# Patient Record
Sex: Male | Born: 1960
Health system: Southern US, Community
[De-identification: ages and names within clinical notes are randomized; demographics above are authoritative.]

## PROBLEM LIST (undated history)

## (undated) DIAGNOSIS — J45909 Unspecified asthma, uncomplicated: Secondary | ICD-10-CM

## (undated) DIAGNOSIS — T7840XA Allergy, unspecified, initial encounter: Secondary | ICD-10-CM

## (undated) DIAGNOSIS — A4902 Methicillin resistant Staphylococcus aureus infection, unspecified site: Secondary | ICD-10-CM

## (undated) DIAGNOSIS — I10 Essential (primary) hypertension: Secondary | ICD-10-CM

## (undated) DIAGNOSIS — J302 Other seasonal allergic rhinitis: Secondary | ICD-10-CM

## (undated) DIAGNOSIS — K602 Anal fissure, unspecified: Secondary | ICD-10-CM

## (undated) DIAGNOSIS — I499 Cardiac arrhythmia, unspecified: Secondary | ICD-10-CM

## (undated) DIAGNOSIS — K219 Gastro-esophageal reflux disease without esophagitis: Secondary | ICD-10-CM

## (undated) DIAGNOSIS — R091 Pleurisy: Secondary | ICD-10-CM

## (undated) DIAGNOSIS — J189 Pneumonia, unspecified organism: Secondary | ICD-10-CM

## (undated) DIAGNOSIS — K635 Polyp of colon: Secondary | ICD-10-CM

## (undated) DIAGNOSIS — C4490 Unspecified malignant neoplasm of skin, unspecified: Secondary | ICD-10-CM

## (undated) HISTORY — DX: Essential (primary) hypertension: I10

## (undated) HISTORY — DX: Anal fissure, unspecified: K60.2

## (undated) HISTORY — DX: Methicillin resistant Staphylococcus aureus infection, unspecified site: A49.02

## (undated) HISTORY — DX: Unspecified malignant neoplasm of skin, unspecified: C44.90

## (undated) HISTORY — DX: Gastro-esophageal reflux disease without esophagitis: K21.9

## (undated) HISTORY — DX: Polyp of colon: K63.5

## (undated) HISTORY — DX: Allergy, unspecified, initial encounter: T78.40XA

## (undated) HISTORY — PX: CHOLECYSTECTOMY: SHX55

---

## 1999-04-16 ENCOUNTER — Emergency Department (HOSPITAL_COMMUNITY): Admission: EM | Admit: 1999-04-16 | Discharge: 1999-04-16 | Payer: Self-pay | Admitting: Emergency Medicine

## 2003-12-28 HISTORY — PX: KNEE SURGERY: SHX244

## 2003-12-28 HISTORY — PX: HEMORRHOID SURGERY: SHX153

## 2004-02-16 ENCOUNTER — Inpatient Hospital Stay (HOSPITAL_COMMUNITY): Admission: EM | Admit: 2004-02-16 | Discharge: 2004-02-17 | Payer: Self-pay | Admitting: *Deleted

## 2004-02-17 ENCOUNTER — Encounter (INDEPENDENT_AMBULATORY_CARE_PROVIDER_SITE_OTHER): Payer: Self-pay | Admitting: *Deleted

## 2004-06-23 ENCOUNTER — Ambulatory Visit (HOSPITAL_COMMUNITY): Admission: RE | Admit: 2004-06-23 | Discharge: 2004-06-23 | Payer: Self-pay | Admitting: Orthopaedic Surgery

## 2004-06-23 ENCOUNTER — Ambulatory Visit (HOSPITAL_BASED_OUTPATIENT_CLINIC_OR_DEPARTMENT_OTHER): Admission: RE | Admit: 2004-06-23 | Discharge: 2004-06-23 | Payer: Self-pay | Admitting: Orthopaedic Surgery

## 2004-12-27 HISTORY — PX: APPENDECTOMY: SHX54

## 2005-08-24 ENCOUNTER — Ambulatory Visit (HOSPITAL_COMMUNITY): Admission: EM | Admit: 2005-08-24 | Discharge: 2005-08-25 | Payer: Self-pay | Admitting: Emergency Medicine

## 2005-08-24 ENCOUNTER — Encounter (INDEPENDENT_AMBULATORY_CARE_PROVIDER_SITE_OTHER): Payer: Self-pay | Admitting: Specialist

## 2006-02-25 ENCOUNTER — Ambulatory Visit (HOSPITAL_COMMUNITY): Admission: RE | Admit: 2006-02-25 | Discharge: 2006-02-25 | Payer: Self-pay | Admitting: Family Medicine

## 2006-03-07 ENCOUNTER — Encounter: Admission: RE | Admit: 2006-03-07 | Discharge: 2006-03-07 | Payer: Self-pay | Admitting: General Surgery

## 2007-12-28 HISTORY — PX: KNEE ARTHROSCOPY: SUR90

## 2008-11-05 ENCOUNTER — Ambulatory Visit (HOSPITAL_BASED_OUTPATIENT_CLINIC_OR_DEPARTMENT_OTHER): Admission: RE | Admit: 2008-11-05 | Discharge: 2008-11-05 | Payer: Self-pay | Admitting: Orthopaedic Surgery

## 2008-12-27 HISTORY — PX: SHOULDER SURGERY: SHX246

## 2009-04-22 ENCOUNTER — Ambulatory Visit (HOSPITAL_BASED_OUTPATIENT_CLINIC_OR_DEPARTMENT_OTHER): Admission: RE | Admit: 2009-04-22 | Discharge: 2009-04-22 | Payer: Self-pay | Admitting: Orthopaedic Surgery

## 2009-08-13 ENCOUNTER — Encounter: Admission: RE | Admit: 2009-08-13 | Discharge: 2009-08-13 | Payer: Self-pay | Admitting: Family Medicine

## 2010-12-27 HISTORY — PX: NASAL FRACTURE SURGERY: SHX718

## 2011-01-17 ENCOUNTER — Encounter: Payer: Self-pay | Admitting: Family Medicine

## 2011-04-07 LAB — BASIC METABOLIC PANEL
BUN: 10 mg/dL (ref 6–23)
CO2: 26 mEq/L (ref 19–32)
Chloride: 105 mEq/L (ref 96–112)

## 2011-04-07 LAB — POCT HEMOGLOBIN-HEMACUE: Hemoglobin: 16.6 g/dL (ref 13.0–17.0)

## 2011-05-11 NOTE — Op Note (Signed)
[name], [name]               ACCOUNT NO.:  [id number]   MEDICAL RECORD NO.:  [id number]          PATIENT TYPE:  AMB   LOCATION:  DSC                          FACILITY:  [facility]   PHYSICIAN:  Lubertha Basque. Dalldorf, M.D.DATE OF BIRTH:  [birth year]   DATE OF PROCEDURE:  04/22/2009  DATE OF DISCHARGE:                               OPERATIVE REPORT   PREOPERATIVE DIAGNOSES:  1. Left shoulder impingement.  2. Left shoulder partial rotator cuff tear.   POSTOPERATIVE DIAGNOSES:  1. Left shoulder impingement.  2. Left shoulder partial rotator cuff tear.   PROCEDURE:  1. Left shoulder arthroscopic acromioplasty.  2. Left shoulder partial claviculectomy.  3. Left shoulder arthroscopic cuff debridement.   ANESTHESIA:  General and block.   ATTENDING SURGEON:  Lubertha Basque. Jerl Santos, MD   ASSISTANT:  Lindwood Qua, PA   INDICATIONS FOR PROCEDURE:  The patient is a 50 year old male with long  history of left shoulder pain.  This has persisted despite an exercise  program and 2 subacromial injections both of which have afforded him  transient relief.  He has had an MRI scan done which shows some  irritation of his rotator cuff but no full-thickness tear.  He has pain  which limits his ability to use his arm on the job and to rest and he is  offered an arthroscopy.  Informed operative consent was obtained after  discussion of possible complications including reaction to anesthesia  and infection.   SUMMARY/FINDINGS/PROCEDURE:  Under general anesthesia and a block, the  left shoulder arthroscopy was performed.  Glenohumeral joint showed no  degenerative changes, and the biceps tendon and subscapularis tendon  looked benign.  He did have a partial-thickness tear of the rotator  cuff, supraspinatus portion, but it was less than 5% thickness of the  rotator cuff.  In the subacromial space, he had some irritation of the  rotator cuff, but no significant tear was seen.  He had a prominent  subacromial morphology, addressed with an acromioplasty back to a flat  surface.  He also had a prominence of the distal clavicle addressed with  a partial claviculectomy.   DESCRIPTION OF THE PROCEDURE:  The patient was brought to the operating  suite where general anesthetic was applied without difficulty.  He was  also given a block in the preanesthesia area.  He was positioned in a  beach-chair position and prepped and draped in normal sterile fashion.  After the administration of IV Kefzol, an arthroscopy of the left  shoulder was performed through a total of 2 portals.  Findings were as  noted above.  Procedure consisted of the debridement of partial-  thickness cuff tear and bursa followed by an acromioplasty done with the  bur in the lateral position followed by transfer of the bur to the  posterior position.  We resected the distal clavicle undersurface in a  similar fashion.  The shoulder was thoroughly irrigated and again the  cuff was thoroughly examined with no tear worthy of repair found.  The  portals were approximated loosely with nylon followed by Adaptic and a  dry  gauze dressing with tape.  Estimated blood loss and intraoperative  fluids obtained from anesthesia records.   DISPOSITION:  The patient was extubated in the operating room and taken  to recovery room in stable addition.  He was to go home on the same day  and follow up in the office next week.  I will contact him by phone  tonight.      Lubertha Basque Jerl Santos, M.D.  Electronically Signed     PGD/MEDQ  D:  04/22/2009  T:  04/22/2009  Job:  [id number]

## 2011-05-11 NOTE — Op Note (Signed)
[name], [name]               ACCOUNT NO.:  [id number]   MEDICAL RECORD NO.:  [id number]          PATIENT TYPE:  AMB   LOCATION:  DSC                          FACILITY:  [facility]   PHYSICIAN:  Lubertha Basque. Dalldorf, M.D.DATE OF BIRTH:  1961-11-01   DATE OF PROCEDURE:  11/05/2008  DATE OF DISCHARGE:                               OPERATIVE REPORT   PREOPERATIVE DIAGNOSES:  1. Left knee torn medial meniscus.  2. Left knee chondromalacia and loose body.   POSTOPERATIVE DIAGNOSES:  1. Left knee torn lateral meniscus.  2. Left knee chondromalacia and loose body.   PROCEDURES:  1. Left knee partial lateral meniscectomy.  2. Left knee abrasion chondroplasty and removal of loose body.   ANESTHESIA:  General.   ATTENDING SURGEON:  Lubertha Basque. Jerl Santos, MD   INDICATIONS FOR PROCEDURE:  The patient is a 50 year old man who is many  years out from an ACL reconstruction.  His knee has remained stable, but  he has developed some pain recently.  This has persisted despite oral  anti-inflammatories and rest and bracing.  He has undergone an MRI scan,  which shows a meniscus tear and chondromalacia and loose body.  He is  offered an arthroscopy.  Informed operative consent was obtained after  discussion of possible complications of reaction to anesthesia and  infection.   SUMMARY, FINDINGS, AND PROCEDURE:  Under general anesthesia, a left knee  arthroscopy was performed.  Suprapatellar pouch was benign while the  patellofemoral joint exhibited some grade 3 and 4 change, addressed with  a thorough chondroplasty and abrasion to bleeding bone through the  intertrochlear region.  Medial compartment exhibited no evidence of  meniscal or articular cartilage injury.  The entire meniscus was probed  and well visualized superior and inferior.  The ACL reconstruction  appeared intact and normal and functional.  The lateral compartment  exhibited a small middle horn meniscus tear, addressed with a tiny  partial lateral meniscectomy.  He had some grade 4 change fairly lateral  and posterior in this compartment and a chondroplasty was done here.  He  did have a loose body, which I removed.  I also checked the posterior  compartment of the knee through the notch and no additional loose bodies  were seen there.   DESCRIPTION OF PROCEDURE:  The patient was taken to the operating suite  where general anesthetic was applied without difficulty.  He was  positioned supine and prepped and draped in the normal sterile fashion.  After administration of IV Kefzol, an arthroscopy of the left knee was  performed through a total of 2 portals.  Findings were as noted above  and procedure consisted of the partial lateral meniscectomy followed by  removal of the loose body, which was cartilaginous in nature and  measured 1 x 5 x 5 mm.  We then performed indicated chondroplasties  lateral and patellofemoral with abrasion to bleeding bone done  patellofemoral in the intertrochlear groove.  I did check the posterior  aspect of the knee through the notch and no additional loose bodies were  seen.  The knee was  thoroughly irrigated followed by placement of  Marcaine with epinephrine and morphine.  Adaptic was placed over the  portals followed by dry gauze and loose Ace wrap.  Estimated blood loss  and intraoperative fluids could be obtained from anesthesia records.   DISPOSITION:  The patient was extubated in the operating room and taken  to the recovery room in stable addition.  He was to go home same day and  follow up in my office next week.  I will contact him by phone tonight.      Lubertha Basque Jerl Santos, M.D.  Electronically Signed     PGD/MEDQ  D:  11/05/2008  T:  11/05/2008  Job:  [id number]

## 2011-05-14 NOTE — H&P (Signed)
[name], [name]               ACCOUNT NO.:  [id number]   MEDICAL RECORD NO.:  [id number]          PATIENT TYPE:  INP   LOCATION:  0107                         FACILITY:  [facility]   PHYSICIAN:  Sharlet Salina T. Hoxworth, M.D.DATE OF BIRTH:  1961-10-05   DATE OF ADMISSION:  08/24/2005  DATE OF DISCHARGE:                                HISTORY & PHYSICAL   CHIEF COMPLAINT:  Right lower quadrant abdominal pain.   HISTORY OF PRESENT ILLNESS:  Mr. Muellner is a 50 year old white male who  presents with 36 hours of constant progressively worsening right lower  quadrant pain.  He describes constant, dull, aching pain with sharp  exacerbations.  He has been nauseated but had no vomiting.  No fever or  chills.  Bowel movements have been normal.  No urinary symptoms.  He denies  any chronic abdominal pain or previous similar complaints.  The pain is  worse with movement and better lying still.   PAST MEDICAL HISTORY:  He has had knee surgery, hemorrhoidectomy, and  bilateral inguinal hernia repair as a child.  No significant medical  problems.   MEDICATIONS:  None.   ALLERGIES:  CODEINE.   SOCIAL HISTORY:  He works as a Community education officer.  Does not smoke  cigarettes.  Drinks alcohol occasionally.  He is married.   FAMILY HISTORY:  Noncontributory.   REVIEW OF SYSTEMS:  GENERAL:  No fever, chills, malaise.  HEENT:  No vision,  hearing, or swallowing problems.  RESPIRATORY:  No shortness of breath or  cough, history of asthma.  CARDIAC:  No chest pain, palpitations, history of  heart disease.  ABDOMEN, GI, GU:  As above.  MUSCULOSKELETAL:  Denies  chronic joint pain, swelling, muscle aches.   PHYSICAL EXAMINATION:  VITAL SIGNS:  Temperature 98.8, pulse 90,  respirations 18, blood pressure 150/90.  GENERAL:  He is a well-developed white male who appears in pain and somewhat  ill.  SKIN:  Slightly diaphoretic.  No rash or infection.  HEENT:  Sclerae anicteric.  No masses.  No  thyromegaly.  LYMPH NODES:  No cervical, supraclavicular, or inguinal nodes palpable.  LUNGS:  Clear to auscultation.  No wheezing or increased work of breathing.  CARDIAC:  Regular rate and rhythm without murmurs.  No edema.  ABDOMEN:  Nondistended.  Bowel sounds hypoactive.  There is well localized,  marked right lower quadrant tenderness with guarding and positive rebound  and heel tap pain.  EXTREMITIES:  No joint swelling or deformity.  NEUROLOGIC:  Alert and oriented.  Motor and sensory grossly normal.   LABORATORY DATA:  CBC normal with white count 6.4.  Urinalysis negative.  Electrolytes, LFTs normal.   ASSESSMENT AND PLAN:  A generally healthy 50 year old male with progressive  severe right lower quadrant abdominal pain, nausea, and marked tenderness.  Clinically this is highly suspicious for acute appendicitis.  I have  recommended proceeding with laparoscopic appendectomy and the patient is  admitted for this procedure.      Lorne Skeens. Hoxworth, M.D.  Electronically Signed     BTH/MEDQ  D:  08/24/2005  T:  08/25/2005  Job:  [id number]

## 2011-05-14 NOTE — Op Note (Signed)
[name], [name]                         ACCOUNT NO.:  [id number]   MEDICAL RECORD NO.:  [id number]                   PATIENT TYPE:  AMB   LOCATION:  DSC                                  FACILITY:  [facility]   PHYSICIAN:  Lubertha Basque. Jerl Santos, M.D.             DATE OF BIRTH:  08-01-1961   DATE OF PROCEDURE:  06/23/2004  DATE OF DISCHARGE:                                 OPERATIVE REPORT   PREOPERATIVE DIAGNOSES:  1. Left knee torn anterior cruciate ligament.  2. Left knee torn medial meniscus.   POSTOPERATIVE DIAGNOSES:  1. Left knee anterior cruciate ligament tear.  2. Left knee chondromalacia, medial femoral condyle.   PROCEDURES:  1. Left knee anterior cruciate ligament reconstruction.  2. Left knee chondroplasty, medial femoral condyle.   ANESTHESIA:  General.   ATTENDING SURGEON:  Lubertha Basque. Jerl Santos, M.D.   ASSISTANT:  Lindwood Qua, P.A.   INDICATION FOR PROCEDURE:  The patient is a 50 year old man who injured his  knee several months ago.  He has persisted with an unstable feeling and an  effusion.  He has undergone an MRI scan, which shows a torn medial meniscus  and a complete ACL disruption.  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  He is offered  an ACL reconstruction in hopes of stabilizing his knee.  Informed operative  consent was obtained after discussion of the possible complications of,  reaction to anesthesia, infection, neurovascular injury, DVT, and the  probability for some significant physical therapy being required.   DESCRIPTION OF PROCEDURE:  The patient was taken to the operating suite,  where a general anesthetic was applied without difficulty.  He was  positioned supine and prepped and draped in the normal sterile fashion.  After the administration of preop IV antibiotics, an arthroscopy of the left  knee was performed through two inferior portals.  The suprapatellar pouch  was benign,  as was the patellofemoral joint.  The medial meniscus appeared  to be intact, and I probed the entire posterior horn.  He did have some  grade 3 chondromalacia of a focal area on the medial femoral condyle,  addressed with a chondroplasty back to stable structures.  Lateral  compartment exhibited no evidence of a meniscal or articular cartilage  injury.  The ACL was completely disrupted, avulsed off the femoral  attachment.  The PCL was intact.  The residual ACL was removed, followed by  a conservative notchplasty with an arthroscopic bur.  A tibial guide was  placed into the knee just anterior to the PCL and utilized to pass a  guidewire through an anterior stab wound on the knee up into the joint just  anterior to the PCL.  This was used to over-ream a tunnel on the tibia to a  diameter of 10 mm.  Through this tunnel a transtibial guide was placed in  the  over-the-top position.  A long guidewire was placed through this and out  the proximal thigh.  This was utilized to ream the distal femur to a depth  of about 3 cm and diameter of 9 mm.  Care was taken to ensure that a 1 or 2  mm posterior wall remained.  A graft was fully defrosted on the back table.  This was then contoured to fit through 9 and 10 mm tunnels.  A PDS suture  was placed through one of the bone blocks and a wire through the other.  This fully-defrosted tensioned graft was then pulled through the tibial  tunnel and into the femoral tunnel utilizing the aforementioned guidewire  for suture passing.  Care was taken to keep the tendinous portion of the  graft in a posterior position as it entered the femoral tunnel.  This graft  was then secured with an interference screw placed in the anterior aspect of  the femoral tunnel over a guidewire.  This was an 8 x 25 metal [employer]  screw.  The knee then ranged fully.  The graft was felt to be fully  isometric.  A second guidewire was placed through the tibial tunnel and seen  to  enter the knee arthroscopically.  An identical interference screw was  placed to secure this graft in position.  The knee again ranged fully.  The  graft was probed and was felt to be taught throughout.  His Lachman test,  which had been loose preoperatively, was now tight.  The knee was irrigated,  followed by removal of arthroscopic equipment.  Simple sutures of nylon were  used to loosely reapproximate the incision.  Estimated blood loss and  intraoperative fluids can be obtained from anesthesia records.  No  tourniquet was utilized.  Adaptic was placed on the wound, followed by dry  gauze and a loose Ace wrap.   DISPOSITION:  The patient was extubated in the operating room and taken to  the recovery room in stable condition.  Plans were for him to go home the  same day, and I will contact him by phone tonight.  He will follow up in  less than a week.                                               Lubertha Basque Jerl Santos, M.D.    PGD/MEDQ  D:  06/23/2004  T:  06/23/2004  Job:  [id number]

## 2011-05-14 NOTE — Op Note (Signed)
[name], [name]               ACCOUNT NO.:  [id number]   MEDICAL RECORD NO.:  [id number]          PATIENT TYPE:  INP   LOCATION:  0107                         [facility]ACILITY:  [facility]   PHYSICIAN:  Sharlet Salina T. Hoxworth, M.D.DATE OF BIRTH:  08-28-1961   DATE OF PROCEDURE:  08/24/2005  DATE OF DISCHARGE:                                 OPERATIVE REPORT   PREOPERATIVE DIAGNOSIS:  Acute appendicitis.   POSTOPERATIVE DIAGNOSIS:  Torsed appendix epiploica of the cecum.   SURGICAL PROCEDURE:  Laparoscopic appendectomy and excision of appendix  epiploica.   SURGEON:  Lorne Skeens. Hoxworth, M.D.   ANESTHESIA:  General.   BRIEF HISTORY:  [name] is a 50 year old male who presents with 36  hours of progressively worsening and now quite severe right lower quadrant  abdominal pain. Examination was remarkable for marked localized tenderness  in the right lower quadrant, guarding and positive rebound and percussion  tenderness. He is felt to have acute appendicitis clinically and  laparoscopic appendectomy has been recommended and accepted. The nature of  the procedure, indications, risks of bleeding, infection and negative  appendectomy have been discussed and understood. He is brought to the  operating room for this procedure.   DESCRIPTION OF PROCEDURE:  The patient was brought to the operating room,  placed in supine position on the operating table and general endotracheal  anesthesia was induced. He received preoperative IV antibiotics. The abdomen  was widely sterilely prepped and draped. Correct position and procedure were  verified. Local anesthesia was used to infiltrate the trocar sites prior to  the incision. A 1 cm incision was made at the umbilicus and dissection  carried down the midline fascia which was sharply incised for 1 cm and the  peritoneum entered under direct vision. Through a mattress suture of #0  Vicryl, the Hasson trocar was placed and pneumoperitoneum  established. Under  direct vision, a 5 mm trocar was placed in the right upper quadrant and a 12  mm trocar in the left lower quadrant. There were noted to be some adhesions  in both groins likely from his previous hernia repairs. The appendix was  visualized lying anteriorly and it did appear mildly congested but not  obviously inflamed. However, on the anterior surface of the cecum was a  torsed appendix epiploica that was necrotic and moderately inflamed. I  suspect this was the source of his pain. The appendix epiploica was elevated  and excised with the harmonic scalpel. I examined the rest of the right  colon, cecum, terminal ileum and sigmoid colon and there were no other  abnormalities noted. The appendix was elevated and some lateral peritoneal  attachments divided completely freeing the appendix. The mesoappendix was  divided with the harmonic scalpel completely freeing the appendix down to  its base. The appendix was divided from the cecum at its base with a single  firing of the GIA 45 mm stapler. The appendix was placed in an EndoCatch bag  and brought out through the umbilicus. The operative site was inspected for  hemostasis which appeared complete. Trocars removed under direct vision and  all CO2 evacuated. The mattress suture was  secured at the through the umbilicus. Skin incisions were closed with  interrupted subcuticular 4-0 Monocryl and Steri-Strips. Sponge, needle and  instrument counts were correct. Dry sterile dressings were applied and the  patient taken to recovery in good condition.      Lorne Skeens. Hoxworth, M.D.  Electronically Signed     BTH/MEDQ  D:  08/24/2005  T:  08/25/2005  Job:  [id number]

## 2011-05-14 NOTE — Op Note (Signed)
NAMETHADDAEUS, GRANJA                         ACCOUNT NO.:  [id number]   MEDICAL RECORD NO.:  [id number]                   PATIENT TYPE:  EMS   LOCATION:  ED                                   FACILITY:  [facility]   PHYSICIAN:  Anselm Pancoast. Zachery Dakins, M.D.          DATE OF BIRTH:  07-01-1961   DATE OF PROCEDURE:  02/16/2004  DATE OF DISCHARGE:                                 OPERATIVE REPORT   PREOPERATIVE DIAGNOSES:  Prolapsed thrombosed internal/external hemorrhoids.   OPERATION:  Internal and external hemorrhoidectomy, three quadrants.   ANESTHESIA:  General.   SURGEON:  Anselm Pancoast. Zachery Dakins, M.D.   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  He went to [facility] today and was seen by Dr.  Merla Riches who thought this was a rectal prolapse.  It is not, but it is  significantly prolapsed internal and also some mild external hemorrhoids.  I  recommended that we proceed on with formal hemorrhoidectomy because of the  spasm.  I do not feel I can adequately try to remove this as it is  predominantly an internal thrombosed hemorrhoid that is prolapsed.  Permission was obtained.   I first did a proctoscopic exam and found no evidence of any other  abnormalities besides these hemorrhoids and then prepped with Betadine  surgical solution and draped in the lithotomy position after induction of  general anesthesia.  He had been given three grams of Unasyn preoperatively  and after draping in a sterile manner, I anesthetized the perianal area with  approximately 20 cc of 0.5% Marcaine with adrenaline.  Next, I slightly  dilated his anus but then used the bullet anoscope and actually first  removed kind of a moderate sized internal hemorrhoid anteriorly.  The  largest necrotic area was the right lateral and I elevated all of these off  the sphincters and underclamped  them with __________ clamp after hemostasis  had been obtained __________  2-0 chromic sutures, removed the hemorrhoid  and then sutured the incision with some new stitches of 2-0 chromic in a  running stitch to close the external and internal portion.  A similar manner  was performed on the left.  There was a smaller hemorrhoid and then all  areas were inspected with good hemostasis and I used some Nupercaine on an  open 4x4 and kind of placed it in the anal canal for immediate postop pain  control and also for hemostasis.  The patient tolerated the procedure nicely  and was taken to the recovery room in stable postoperative condition.  I  will discuss with him in an hour whether he will be discharged or kept  overnight and go home in the morning.  He, of course, will not be able to  return to work for several days and as far as his  weightlifting, which is a  hobby, I would recommend that we hold off on that for about three weeks.                                               Anselm Pancoast. Zachery Dakins, M.D.    WJW/MEDQ  D:  02/16/2004  T:  02/16/2004  Job:  [id number]

## 2011-06-18 ENCOUNTER — Encounter (HOSPITAL_BASED_OUTPATIENT_CLINIC_OR_DEPARTMENT_OTHER)
Admission: RE | Admit: 2011-06-18 | Discharge: 2011-06-18 | Disposition: A | Discharge status: Home / Self Care | Payer: BC Managed Care – PPO | Source: Ambulatory Visit | Attending: Otolaryngology | Admitting: Otolaryngology

## 2011-06-18 LAB — BASIC METABOLIC PANEL
BUN: 15 mg/dL (ref 6–23)
Calcium: 9.2 mg/dL (ref 8.4–10.5)
Chloride: 104 mEq/L (ref 96–112)
Creatinine, Ser: 0.94 mg/dL (ref 0.50–1.35)
GFR calc Af Amer: >60 mL/min (ref >60)
Potassium: 4.4 mEq/L (ref 3.5–5.1)

## 2011-06-21 ENCOUNTER — Ambulatory Visit (HOSPITAL_BASED_OUTPATIENT_CLINIC_OR_DEPARTMENT_OTHER)
Admission: RE | Admit: 2011-06-21 | Discharge: 2011-06-21 | Disposition: A | Discharge status: Home / Self Care | Payer: BC Managed Care – PPO | Source: Ambulatory Visit | Attending: Otolaryngology | Admitting: Otolaryngology

## 2011-06-21 DIAGNOSIS — Z01810 Encounter for preprocedural cardiovascular examination: Secondary | ICD-10-CM | POA: Insufficient documentation

## 2011-06-21 DIAGNOSIS — I10 Essential (primary) hypertension: Secondary | ICD-10-CM | POA: Insufficient documentation

## 2011-06-21 DIAGNOSIS — Y9364 Activity, baseball: Secondary | ICD-10-CM | POA: Insufficient documentation

## 2011-06-21 DIAGNOSIS — Y998 Other external cause status: Secondary | ICD-10-CM | POA: Insufficient documentation

## 2011-06-21 DIAGNOSIS — W219XXA Striking against or struck by unspecified sports equipment, initial encounter: Secondary | ICD-10-CM | POA: Insufficient documentation

## 2011-06-21 DIAGNOSIS — Z01812 Encounter for preprocedural laboratory examination: Secondary | ICD-10-CM | POA: Insufficient documentation

## 2011-06-21 DIAGNOSIS — S022XXA Fracture of nasal bones, initial encounter for closed fracture: Secondary | ICD-10-CM | POA: Insufficient documentation

## 2011-06-25 NOTE — Op Note (Signed)
  NAMEBERNARR, LONGSWORTH NO.:  [id number]  MEDICAL RECORD NO.:  [id number]  LOCATION:                                 FACILITY:  PHYSICIAN:  Zola Button T. Lazarus Salines, M.D.      DATE OF BIRTH:  DATE OF PROCEDURE:  06/21/2011 DATE OF DISCHARGE:                              OPERATIVE REPORT   PREOPERATIVE DIAGNOSIS:  Closed displaced nasal fracture.  POSTOPERATIVE DIAGNOSIS:  Closed displaced nasal fracture.  PROCEDURE PERFORMED:  Closed reduction of nasal fracture with stabilization both external and internal.  SURGEON:  Malaina Mortellaro T. Lazarus Salines, MD  ANESTHESIA:  General LMA.  BLOOD LOSS:  Minimal.  COMPLICATIONS:  None.  FINDINGS:  A rightward deviation of the bony dorsum with depression of the left nasal bone, readily reduced, but with a tendency for left nasal bone to collapse.  PROCEDURE:  With the patient having received preoperative Afrin spray, general LMA anesthesia was administered.  At an appropriate level, he was placed in a slight sitting position.  The nose was inspected externally and internally with the findings as described above.  The blunt fracture elevator was placed under the nasal dorsum and with anterior and left lateral pressure, the dorsum was replaced and the left nasal bone was elevated.  There was some tendency for left nasal bone to collapse.  It was felt to indicate to place packing.  A Merocel pack was fashioned to fit up onto the nasal dorsum and a second piece was placed low down to support the first piece of packing. These were placed and then moistened with Ciprodex drops to expand them. A good reduction of the nose was accomplished.  The external nose was cleaned with alcohol, painted with skin prep, and then had Steri-Strips applied in the standard fashion.  A small/medium [place] splint was fashioned and placed on the nose and compressed slightly for support. Hemostasis was observed.  A small amount of blood and secretions  was suctioned from the pharynx.  The patient was returned to Anesthesia, awakened, extubated, and transferred to recovery in stable condition.  COMMENT:  This is a 50 year old white male sustained a softball blow to the face approximately 10 days ago with primary complaint being left- sided nasal obstruction.  This was consistent with the depression of the left nasal bone as identified above.  Anticipated routine postoperative recovery with attention to ice, elevation, analgesia, and antibiosis.  We will remove the internal packing in 4 days and the external splint in 10 days. Given low anticipated risk of postanesthetic or postsurgical complications, I feel an outpatient venue is appropriate.     Gloris Manchester. Lazarus Salines, M.D.     KTW/MEDQ  D:  06/21/2011  T:  06/22/2011  Job:  [id number]  cc:   Joycelyn Rua, M.D.  Electronically Signed by Flo Shanks M.D. on 06/25/2011 08:57:37 AM

## 2011-09-27 DIAGNOSIS — F411 Generalized anxiety disorder: Secondary | ICD-10-CM | POA: Insufficient documentation

## 2011-09-28 LAB — BASIC METABOLIC PANEL
BUN: 15
Calcium: 9.1
Chloride: 106
Creatinine, Ser: 0.82
GFR calc Af Amer: >60
GFR calc non Af Amer: >60

## 2012-10-02 ENCOUNTER — Other Ambulatory Visit: Payer: Self-pay | Admitting: Dermatology

## 2013-03-19 ENCOUNTER — Other Ambulatory Visit: Payer: Self-pay | Admitting: Pediatrics

## 2013-03-19 ENCOUNTER — Ambulatory Visit
Admission: RE | Admit: 2013-03-19 | Discharge: 2013-03-19 | Disposition: A | Discharge status: Home / Self Care | Payer: BC Managed Care – PPO | Source: Ambulatory Visit | Attending: Pediatrics | Admitting: Pediatrics

## 2013-03-19 DIAGNOSIS — R059 Cough, unspecified: Secondary | ICD-10-CM

## 2013-03-19 DIAGNOSIS — J189 Pneumonia, unspecified organism: Secondary | ICD-10-CM

## 2013-03-19 DIAGNOSIS — R05 Cough: Secondary | ICD-10-CM

## 2013-04-04 ENCOUNTER — Institutional Professional Consult (permissible substitution): Payer: BC Managed Care – PPO | Admitting: Internal Medicine

## 2013-04-16 ENCOUNTER — Encounter: Payer: Self-pay | Admitting: *Deleted

## 2013-04-25 ENCOUNTER — Encounter: Payer: Self-pay | Admitting: Internal Medicine

## 2013-04-26 ENCOUNTER — Institutional Professional Consult (permissible substitution): Payer: BC Managed Care – PPO | Admitting: Internal Medicine

## 2013-05-11 ENCOUNTER — Encounter: Payer: Self-pay | Admitting: Emergency Medicine

## 2013-05-11 ENCOUNTER — Ambulatory Visit (INDEPENDENT_AMBULATORY_CARE_PROVIDER_SITE_OTHER): Payer: BC Managed Care – PPO | Admitting: Emergency Medicine

## 2013-05-11 VITALS — BP 130/84 | HR 110 | Ht 68.75 in | Wt 197.8 lb

## 2013-05-11 DIAGNOSIS — R0600 Dyspnea, unspecified: Secondary | ICD-10-CM

## 2013-05-11 DIAGNOSIS — R05 Cough: Secondary | ICD-10-CM

## 2013-05-11 DIAGNOSIS — R059 Cough, unspecified: Secondary | ICD-10-CM | POA: Insufficient documentation

## 2013-05-11 DIAGNOSIS — R0989 Other specified symptoms and signs involving the circulatory and respiratory systems: Secondary | ICD-10-CM

## 2013-05-11 DIAGNOSIS — R091 Pleurisy: Secondary | ICD-10-CM | POA: Insufficient documentation

## 2013-05-11 DIAGNOSIS — K219 Gastro-esophageal reflux disease without esophagitis: Secondary | ICD-10-CM | POA: Insufficient documentation

## 2013-05-11 DIAGNOSIS — J309 Allergic rhinitis, unspecified: Secondary | ICD-10-CM | POA: Insufficient documentation

## 2013-05-11 DIAGNOSIS — J948 Other specified pleural conditions: Secondary | ICD-10-CM | POA: Insufficient documentation

## 2013-05-11 DIAGNOSIS — J4520 Mild intermittent asthma, uncomplicated: Secondary | ICD-10-CM | POA: Insufficient documentation

## 2013-05-11 MED ORDER — AZELASTINE HCL 0.1 % NA SOLN: 2.0000 | Freq: Two times a day (BID) | NASAL | Status: DC

## 2013-05-11 MED ORDER — LOSARTAN POTASSIUM 50 MG PO TABS: 50.0000 mg | ORAL_TABLET | Freq: Every day | ORAL | Status: DC

## 2013-05-11 MED ORDER — LORATADINE 10 MG PO TABS: 10.0000 mg | ORAL_TABLET | Freq: Every day | ORAL | Status: DC

## 2013-05-11 NOTE — Patient Instructions (Addendum)
We will perform a CT scan of the chest to better evaluate the scarring in your left lung We will perform pulmonary function testing at your next office visit Please continue your fluticasone nasal spray, 2 sprays each side twice a day Please start loratadine 10mg  daily (Claritin) Please start astelin 2 sprays each nostril 2 times a day Please stop asmanex and lisinopril Increase protonix to 40mg  twice a day for 10 days, then go back to once a day Start losartan 50mg  daily (as a replacement for lisinopril).  Follow with Dr Delton Coombes next available with full PFT

## 2013-05-11 NOTE — Assessment & Plan Note (Signed)
Etiology unclear, ? Whether he has bronchiectasis, ? Exposure related or related to recent PNA. Will get old CXR from [facility], will perform CT scan of the chest to better characterize.

## 2013-05-11 NOTE — Progress Notes (Signed)
Subjective:     Patient ID: [name], male   DOB: 1961/05/30, 52 y.o.   MRN: [id number]  HPI 52 yo former smoker, hx HTN, allergic rhinitis. Works as a Administrator. He presents for evaluation of dyspnea, chest discomfort. He was dx with PNA and pleurisy in 12/13. Per his report he was evaluated for cardiac CP - negative.  CXR not available to me right now, but f/u CXR 3/'14 showed possible lingular and L basilar scar. His CP never really resolved but was better. Has severe allergies, GERD, has continued to cough all along, but now worse during the pollen season  He is on Protonix, increased to 40mg  last year. Started asmanex and albuterol   He is usually active, but is now having exertional SOB. Has palpitations and dizziness. He is having nausea, vomiting, GERD sx.   Review of Systems  Constitutional: Positive for fatigue. Negative for fever, chills, diaphoresis and unexpected weight change.  HENT: Positive for congestion, sore throat, trouble swallowing, postnasal drip and sinus pressure. Negative for ear pain, nosebleeds, rhinorrhea, sneezing and dental problem.   Eyes: Negative for redness and itching.  Respiratory: Positive for cough, choking, chest tightness, shortness of breath and wheezing.   Cardiovascular: Positive for chest pain and palpitations. Negative for leg swelling.  Gastrointestinal: Positive for nausea, vomiting and diarrhea.  Genitourinary: Negative for dysuria.  Musculoskeletal: Negative for joint swelling.  Skin: Negative for rash.  Neurological: Positive for dizziness and light-headedness. Negative for headaches.  Hematological: Does not bruise/bleed easily.  Psychiatric/Behavioral: Negative for dysphoric mood. The patient is not nervous/anxious.     Past Medical History  Diagnosis Date  . Hypertension   . GERD (gastroesophageal reflux disease)   . Rectal fissure   . Skin cancer      Family History  Problem Relation Age of Onset  . Heart attack Mother    . Heart attack Father   . Skin cancer Mother   . Hypertension Mother      History   Social History  . Marital Status: Married    Spouse Name: N/A    Number of Children: 5  . Years of Education: N/A   Occupational History  . landscaping    Social History Main Topics  . Smoking status: Former Smoker -- 0.25 packs/day for 4 years    Types: Cigarettes    Quit date: 12/27/1984  . Smokeless tobacco: Never Used  . Alcohol Use: Not on file  . Drug Use: No     Comment: quit smoking marajuana 5 yrs ago  . Sexually Active: Not on file   Other Topics Concern  . Not on file   Social History Narrative  . No narrative on file     Allergies  Allergen Reactions  . Codeine Nausea Only     Outpatient Prescriptions Prior to Visit  Medication Sig Dispense Refill  . albuterol (PROVENTIL HFA;VENTOLIN HFA) 108 (90 BASE) MCG/ACT inhaler Inhale 2 puffs into the lungs every 4 (four) hours as needed for wheezing.      . B Complex-C-Folic Acid (STRESS B COMPLEX PO) Take 1 tablet by mouth daily.      . calcium carbonate (TUMS - DOSED IN MG ELEMENTAL CALCIUM) 500 MG chewable tablet Chew 1-2 tablets by mouth as needed for heartburn.      . escitalopram (LEXAPRO) 10 MG tablet Take 10 mg by mouth daily.      . fluticasone (FLONASE) 50 MCG/ACT nasal spray Place 2 sprays into the nose  daily.      . lisdexamfetamine (VYVANSE) 30 MG capsule Take 30 mg by mouth 2 (two) times daily as needed.       [name] lisinopril (PRINIVIL,ZESTRIL) 10 MG tablet Take 10 mg by mouth daily.      . Misc Natural Products (OSTEO BI-FLEX JOINT SHIELD) TABS Take 1 tablet by mouth daily.      . mometasone (ASMANEX) 220 MCG/INH inhaler Inhale 1 puff into the lungs 2 (two) times daily.      . pantoprazole (PROTONIX) 40 MG tablet Take 40 mg by mouth daily.      . cetirizine (ZYRTEC) 10 MG tablet Take 10 mg by mouth daily.       No facility-administered medications prior to visit.       Objective:   Physical Exam Filed Vitals:    05/11/13 1620  BP: 130/84  Pulse: 110  Height: 5' 8.75" (1.746 m)  Weight: 197 lb 12.8 oz (89.721 kg)  SpO2: 97%  Gen: Pleasant, well-nourished, in no distress,  normal affect  ENT: No lesions,  mouth clear,  oropharynx clear, no postnasal drip  Neck: No JVD, no TMG, no carotid bruits  Lungs: No use of accessory muscles, bilateral coarse exp wheeze and rhonchi louder on the L   Cardiovascular: RRR, heart sounds normal, no murmur or gallops, no peripheral edema  Musculoskeletal: No deformities, no cyanosis or clubbing  Neuro: alert, non focal  Skin: Warm, no lesions or rashes      Assessment:     Cough This is multifactorial and its persistence has likely exacerbated and perpetuated his pleural pain.  - change lisinopril to losartan - stop asmanex - keep albuterol available to use prn - treat rhinitis and GERD more aggressively  Pleurisy Suspect this relates to his reported PNA in 12/13. It has improved but never resolved. Now being driven by cough. ? The contribution of the pleural scarring.   Pleural scarring Etiology unclear, ? Whether he has bronchiectasis, ? Exposure related or related to recent PNA. Will get old CXR from [facility], will perform CT scan of the chest to better characterize.  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 Whether a component of obstruction here.  - full PFT - consider a1-AT testing next visit  GERD (gastroesophageal reflux disease) - double protonix to 40mg  bid x 10 days, then go back to qd

## 2013-05-11 NOTE — Assessment & Plan Note (Addendum)
This is multifactorial and its persistence has likely exacerbated and perpetuated his pleural pain.  - change lisinopril to losartan - stop asmanex - keep albuterol available to use prn - treat rhinitis and GERD more aggressively

## 2013-05-11 NOTE — Assessment & Plan Note (Signed)
Suspect this relates to his reported PNA in 12/13. It has improved but never resolved. Now being driven by cough. ? The contribution of the pleural scarring.

## 2013-05-11 NOTE — Assessment & Plan Note (Signed)
-   double protonix to 40mg  bid x 10 days, then go back to qd

## 2013-05-11 NOTE — Assessment & Plan Note (Signed)
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

## 2013-05-11 NOTE — Assessment & Plan Note (Signed)
?   Whether a component of obstruction here.  - full PFT - consider a1-AT testing next visit

## 2013-05-15 ENCOUNTER — Ambulatory Visit (INDEPENDENT_AMBULATORY_CARE_PROVIDER_SITE_OTHER)
Admission: RE | Admit: 2013-05-15 | Discharge: 2013-05-15 | Disposition: A | Discharge status: Home / Self Care | Payer: BC Managed Care – PPO | Source: Ambulatory Visit | Attending: Emergency Medicine | Admitting: Emergency Medicine

## 2013-05-15 DIAGNOSIS — R091 Pleurisy: Secondary | ICD-10-CM

## 2013-05-15 DIAGNOSIS — J948 Other specified pleural conditions: Secondary | ICD-10-CM

## 2013-05-15 MED ORDER — IOHEXOL 300 MG/ML  SOLN
80.0000 mL | Freq: Once | INTRAMUSCULAR | Status: AC | PRN
  Administered 2013-05-15: 80 mL via INTRAVENOUS

## 2013-05-23 NOTE — Progress Notes (Signed)
Quick Note:  Spoke with patient, made him aware of results/recs as listed below per RB Patient verbalized understanding and nothing further needed at this time ______ 

## 2013-05-28 ENCOUNTER — Ambulatory Visit (INDEPENDENT_AMBULATORY_CARE_PROVIDER_SITE_OTHER): Payer: BC Managed Care – PPO | Admitting: Adult Health

## 2013-05-28 ENCOUNTER — Ambulatory Visit (INDEPENDENT_AMBULATORY_CARE_PROVIDER_SITE_OTHER)
Admission: RE | Admit: 2013-05-28 | Discharge: 2013-05-28 | Disposition: A | Discharge status: Home / Self Care | Payer: BC Managed Care – PPO | Source: Ambulatory Visit | Attending: Adult Health | Admitting: Adult Health

## 2013-05-28 ENCOUNTER — Encounter: Payer: Self-pay | Admitting: Adult Health

## 2013-05-28 VITALS — BP 134/90 | HR 87 | Temp 98.4°F | Ht 69.0 in | Wt 201.0 lb

## 2013-05-28 DIAGNOSIS — R091 Pleurisy: Secondary | ICD-10-CM

## 2013-05-28 MED ORDER — PREDNISONE 10 MG PO TABS: ORAL_TABLET | ORAL | Status: DC

## 2013-05-28 NOTE — Patient Instructions (Addendum)
Prednisone taper over next week  Warm heat to chest As needed   Finish Zpack as directed.  Follow up Dr. Delton Coombes as planned next week  and As needed   Please contact office for sooner follow up if symptoms do not improve or worsen or seek emergency care

## 2013-05-28 NOTE — Progress Notes (Signed)
Subjective:     Patient ID: [name], male   DOB: 02-19-1961, 52 y.o.   MRN: [id number]  HPI 52 yo former smoker, hx HTN, allergic rhinitis. 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 He was dx with PNA and pleurisy in 12/13. Per his report he was evaluated for cardiac CP - negative.  CXR not available to me right now, but f/u CXR 3/'14 showed possible lingular and L basilar scar. His CP never really resolved but was better. Has severe allergies, GERD, has continued to cough all along, but now worse during the pollen season He is on Protonix, increased to 40mg  last year. Started asmanex and albuterol  He is usually active, but is now having exertional SOB. Has palpitations and dizziness. He is having nausea, vomiting, GERD sx.   05/28/2013 Acute OV  Complains of sharp pain in left lung-in lower posterior back, increased SOB, hoarseness, wheezing onset this AM.  Will finish zpak tomorrow from PCP. CT chest 05/23/13 showed No acute cardiopulmonary abnormalities. . Left base scarring.  3 mm nodule in the right middle lobe CXR today shows Stable trace linear pleural parenchymal scarring in the lingula. The lungs are otherwise clear Congestion is improved with clear mucus.  Took 6 vicodin this am for pain. Advised on dangers of over medication .  Has upcoming PFT. No hemoptysis, edema, calf pain, fever or orthopnea.     Review of Systems  Constitutional:   No  weight loss, night sweats,  Fevers, chills, fatigue, or  lassitude.  HEENT:   No headaches,  Difficulty swallowing,  Tooth/dental problems, or  Sore throat,                No sneezing, itching, ear ache, nasal congestion, post nasal drip,   CV:  No chest pain,  Orthopnea, PND, swelling in lower extremities, anasarca, dizziness, palpitations, syncope.   GI  No heartburn, indigestion, abdominal pain, nausea, vomiting, diarrhea, change in bowel  habits, loss of appetite, bloody stools.   Resp:   No chest wall deformity  Skin: no rash or lesions.  GU: no dysuria, change in color of urine, no urgency or frequency.  No flank pain, no hematuria   MS:  No joint pain or swelling.  No decreased range of motion.  No back pain.  Psych:  No change in mood or affect. No depression or anxiety.  No memory loss.           Objective:   Physical Exam  Gen: Pleasant, well-nourished, in no distress,  normal affect  ENT: No lesions,  mouth clear,  oropharynx clear, no postnasal drip  Neck: No JVD, no TMG, no carotid bruits  Lungs: No use of accessory muscles,  No wheezing noted.  Tender along posterior left chest /back. No rash/deformity noted.   Cardiovascular: RRR, heart sounds normal, no murmur or gallops, no peripheral edema  Musculoskeletal: No deformities, no cyanosis or clubbing  Neuro: alert, non focal  Skin: Warm, no lesions or rashes  CXR 05/28/2013 Stable trace linear pleural parenchymal scarring in the lingula. The lungs are otherwise clear     Assessment:

## 2013-05-28 NOTE — Assessment & Plan Note (Signed)
Slow to resolve Bronchitis with pleurisy CXR today with no acute finding  CT  Chest last week w/ no acute finding except small nodule that will need serial follow up   Plan  Prednisone taper over next week  Warm heat to chest As needed   Finish Zpack as directed.  Follow up Dr. Delton Coombes as planned next week  and As needed   Please contact office for sooner follow up if symptoms do not improve or worsen or seek emergency care

## 2013-06-06 ENCOUNTER — Encounter (HOSPITAL_BASED_OUTPATIENT_CLINIC_OR_DEPARTMENT_OTHER): Payer: Self-pay | Admitting: *Deleted

## 2013-06-06 ENCOUNTER — Other Ambulatory Visit: Payer: Self-pay | Admitting: Orthopaedic Surgery

## 2013-06-06 NOTE — H&P (Signed)
[name] is an 52 y.o. male.   Chief Complaint: Left knee pain HPI: [name] has had medial joint line pain of the left knee for a number of weeks.  This is a result of a twisting injury playing softball.  He has had previous ACL reconstruction and arthroscopy as well as Supartz in the past.  He is complaining of medial joint line pain with increased activities.  Recent MRI scan from may 31st 2014 shows a new medial meniscus tear with ACL ligament intact.  We have discussed proceeding with a left knee arthroscopy to take care of the meniscus tear.  Past Medical History  Diagnosis Date  . Hypertension   . GERD (gastroesophageal reflux disease)   . Rectal fissure   . Skin cancer     Past Surgical History  Procedure Laterality Date  . Appendectomy    . Cholecystectomy    . Knee surgery    . Shoulder surgery    . Hemorrhoid surgery      Family History  Problem Relation Age of Onset  . Heart attack Mother   . Heart attack Father   . Skin cancer Mother   . Hypertension Mother    Social History:  reports that he quit smoking about 28 years ago. His smoking use included Cigarettes. He has a 1 pack-year smoking history. He has never used smokeless tobacco. He reports that he does not use illicit drugs. His alcohol history is not on file.  Allergies:  Allergies  Allergen Reactions  . Codeine Nausea Only    No prescriptions prior to admission    No results found for this or any previous visit (from the past 48 hour(s)). No results found.  Review of Systems  Constitutional: Negative.   HENT: Negative.   Eyes: Negative.   Respiratory: Negative.   Cardiovascular: Negative.   Gastrointestinal: Negative.   Genitourinary: Negative.   Musculoskeletal: Positive for joint pain.  Skin: Negative.   Neurological: Negative.   Endo/Heme/Allergies: Negative.   Psychiatric/Behavioral: Negative.     There were no vitals taken for this visit. Physical Exam  Constitutional: He  appears well-nourished.  HENT:  Head: Normocephalic.  Eyes: Pupils are equal, round, and reactive to light.  Neck: Normal range of motion.  Cardiovascular: Normal rate.   Respiratory: Effort normal.  GI: Soft.  Musculoskeletal:  Left knee exam: Trace effusion.  Range of motion 0-1 30.  Pain along the medial joint line to palpation.  Medial McMurray's positive  Neurological: He is alert.  Skin: Skin is warm.  Psychiatric: He has a normal mood and affect.     Assessment/Plan Assessment: Left knee torn meniscus by MRI scan with ACL reconstruction 2005, arthroscopy 2009 Plan: [name] has had increased left knee pain despite conservative treatment to this point.  We have decided to proceed with a knee arthroscopy to take care of the meniscus tear.  We have discussed the risks of anesthesia and infection as well as potential DVT associated with knee arthroscopy.  Have also discussed the need for postoperative physical therapy to maximize his good results.  [name] R 06/06/2013, 10:40 AM

## 2013-06-07 ENCOUNTER — Encounter (HOSPITAL_BASED_OUTPATIENT_CLINIC_OR_DEPARTMENT_OTHER): Payer: Self-pay | Admitting: *Deleted

## 2013-06-07 NOTE — Progress Notes (Signed)
Pt has had recent pleurisy and bronchitis-was on steroids and meds-still congested-seeing resp md today to see if he can have surgery-may need to r/s until resp status better. He does need an ekg and bmet Will review instructions if able to have surgery next week,

## 2013-06-08 ENCOUNTER — Encounter: Payer: Self-pay | Admitting: Emergency Medicine

## 2013-06-08 ENCOUNTER — Ambulatory Visit (INDEPENDENT_AMBULATORY_CARE_PROVIDER_SITE_OTHER): Payer: BC Managed Care – PPO | Admitting: Emergency Medicine

## 2013-06-08 VITALS — BP 140/82 | HR 82 | Temp 98.3°F | Ht 69.0 in | Wt 201.0 lb

## 2013-06-08 DIAGNOSIS — R091 Pleurisy: Secondary | ICD-10-CM

## 2013-06-08 DIAGNOSIS — R0600 Dyspnea, unspecified: Secondary | ICD-10-CM

## 2013-06-08 DIAGNOSIS — R0989 Other specified symptoms and signs involving the circulatory and respiratory systems: Secondary | ICD-10-CM

## 2013-06-08 DIAGNOSIS — R059 Cough, unspecified: Secondary | ICD-10-CM

## 2013-06-08 DIAGNOSIS — R05 Cough: Secondary | ICD-10-CM

## 2013-06-08 DIAGNOSIS — J948 Other specified pleural conditions: Secondary | ICD-10-CM

## 2013-06-08 LAB — PULMONARY FUNCTION TEST

## 2013-06-08 NOTE — Assessment & Plan Note (Signed)
Could be pleuritic, but suspect that this is MSK. Controlling cough and symptomatic relief probably best options here

## 2013-06-08 NOTE — Assessment & Plan Note (Signed)
Likely mild AFL, mixed disease on Spiro - SABA prn

## 2013-06-08 NOTE — Progress Notes (Signed)
Subjective:     Patient ID: [name], male   DOB: 06/26/1961, 52 y.o.   MRN: [id number]  HPI 52 yo former smoker (1-2 pk-yrs), hx HTN, allergic rhinitis. 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 He was dx with PNA and pleurisy in 12/13. CT chest at that time > Per his report he was evaluated for cardiac CP - negative.  CXR not available to me right now, but f/u CXR 3/'14 showed possible lingular and L basilar scar. His CP never really resolved but was better. Has severe allergies, GERD, has continued to cough all along, but now worse during the pollen season He is on Protonix, increased to 40mg  last year. Started asmanex and albuterol  He is usually active, but is now having exertional SOB. Has palpitations and dizziness. He is having nausea, vomiting, GERD sx.   Acute OV 05/28/13 --  Complains of sharp pain in left lung-in lower posterior back, increased SOB, hoarseness, wheezing onset this AM.  Will finish zpak tomorrow from PCP. CT chest 05/23/13 showed No acute cardiopulmonary abnormalities. . Left base scarring.  3 mm nodule in the right middle lobe CXR today shows Stable trace linear pleural parenchymal scarring in the lingula. The lungs are otherwise clear Congestion is improved with clear mucus.  Took 6 vicodin this am for pain. Advised on dangers of over medication .  Has upcoming PFT. No hemoptysis, edema, calf pain, fever or orthopnea.   ROV 06/08/13 -- follows up for L flank and back pain. Had PFT today - mild mixed disease with a BD response, normal volumes, normal DLCO. We changed lisinopril to losartan, started loratadine, doubled protonix x 10 days.  He is supposed to have L knee sgy soon - is cleared from my standpoint.   PULMONARY FUNCTON TEST 06/08/2013  FVC 3.56  FEV1 2.78  FEV1/FVC 78.1  FVC  % Predicted 73  FEV % Predicted 74  FeF 25-75 3.5  FeF 25-75 % Predicted 4.63      Objective:   Physical Exam  Filed Vitals:   06/08/13 1403  BP: 140/82  Pulse: 82  Temp: 98.3 F (36.8 C)  TempSrc: Oral  Height: 5\' 9"  (1.753 m)  Weight: 201 lb (91.173 kg)  SpO2: 98%   Gen: Pleasant, well-nourished, in no distress,  normal affect  ENT: No lesions,  mouth clear,  oropharynx clear, no postnasal drip  Neck: No JVD, no TMG, no carotid bruits  Lungs: No use of accessory muscles,  No wheezing noted.  Tender along posterior left chest /back. No rash/deformity noted.   Cardiovascular: RRR, heart sounds normal, no murmur or gallops, no peripheral edema  Musculoskeletal: No deformities, no cyanosis or clubbing  Neuro: alert, non focal  Skin: Warm, no lesions or rashes   CT CHEST WITH CONTRAST  05/15/13 --  Technique: Multidetector CT imaging of the chest was performed  following the standard protocol during bolus administration of  intravenous contrast.  Contrast: 80mL OMNIPAQUE IOHEXOL 300 MG/ML SOLN  Comparison: None  Findings:  No pleural effusion identified. Scarring noted in the left base,  image 44/series 3. Subpleural nodule in the right middle lobe  measures 3 mm, image 34/series 3.  Heart size appears normal. No pericardial effusion. There is no  enlarged mediastinal or hilar lymph nodes.  Limited imaging through the upper abdomen shows changes from prior  cholecystectomy. No acute findings identified.  There is  no axillary or supraclavicular adenopathy. Review of the  visualized osseous structures is negative for lytic or sclerotic  bone lesion.  IMPRESSION:  1. No acute cardiopulmonary abnormalities.  2. Left base scarring.  3. 3 mm nodule in the right middle lobe. If the patient is at high  risk for bronchogenic carcinoma, follow-up chest CT at 1 year is  recommended.     Assessment:  Pleurisy Could be pleuritic, but suspect that this is MSK. Controlling cough and symptomatic relief probably best options here  Cough Treating GERD and  PND   Dyspnea on exertion Likely mild AFL, mixed disease on Spiro - SABA prn

## 2013-06-08 NOTE — Patient Instructions (Addendum)
We will repeat your CT scan of the chest in May 2015 to follow your RML nodule.  Your breathing tests are reassuring. There is no reason you cannot get your knee surgery.  Use albuterol as needed for shortness of breath Continue your fluticasone nasal spray, loratadine and protonix Follow with Dr Delton Coombes next May so we can set up your CT scan. Call sooner if you have any problems.

## 2013-06-08 NOTE — Progress Notes (Signed)
PFT done today. 

## 2013-06-08 NOTE — Assessment & Plan Note (Signed)
Treating GERD and PND

## 2013-06-11 ENCOUNTER — Encounter (HOSPITAL_BASED_OUTPATIENT_CLINIC_OR_DEPARTMENT_OTHER)
Admission: RE | Admit: 2013-06-11 | Discharge: 2013-06-11 | Disposition: A | Discharge status: Home / Self Care | Payer: BC Managed Care – PPO | Source: Ambulatory Visit | Attending: Orthopaedic Surgery | Admitting: Orthopaedic Surgery

## 2013-06-11 LAB — BASIC METABOLIC PANEL
BUN: 15 mg/dL (ref 6–23)
GFR calc Af Amer: >90 mL/min (ref >90)
GFR calc non Af Amer: >90 mL/min (ref >90)
Potassium: 4.3 mEq/L (ref 3.5–5.1)
Sodium: 140 mEq/L (ref 135–145)

## 2013-06-12 ENCOUNTER — Encounter (HOSPITAL_BASED_OUTPATIENT_CLINIC_OR_DEPARTMENT_OTHER): Payer: Self-pay | Admitting: Anesthesiology

## 2013-06-12 ENCOUNTER — Encounter (HOSPITAL_BASED_OUTPATIENT_CLINIC_OR_DEPARTMENT_OTHER): Payer: Self-pay

## 2013-06-12 ENCOUNTER — Ambulatory Visit (HOSPITAL_BASED_OUTPATIENT_CLINIC_OR_DEPARTMENT_OTHER)
Admission: RE | Admit: 2013-06-12 | Discharge: 2013-06-12 | Disposition: A | Discharge status: Home / Self Care | Payer: BC Managed Care – PPO | Source: Ambulatory Visit | Attending: Orthopaedic Surgery | Admitting: Orthopaedic Surgery

## 2013-06-12 ENCOUNTER — Encounter (HOSPITAL_BASED_OUTPATIENT_CLINIC_OR_DEPARTMENT_OTHER)
Admission: RE | Disposition: A | Discharge status: Home / Self Care | Payer: Self-pay | Source: Ambulatory Visit | Attending: Orthopaedic Surgery

## 2013-06-12 ENCOUNTER — Ambulatory Visit (HOSPITAL_BASED_OUTPATIENT_CLINIC_OR_DEPARTMENT_OTHER): Payer: BC Managed Care – PPO | Admitting: Anesthesiology

## 2013-06-12 DIAGNOSIS — X500XXA Overexertion from strenuous movement or load, initial encounter: Secondary | ICD-10-CM | POA: Insufficient documentation

## 2013-06-12 DIAGNOSIS — S83242A Other tear of medial meniscus, current injury, left knee, initial encounter: Secondary | ICD-10-CM

## 2013-06-12 DIAGNOSIS — M2390 Unspecified internal derangement of unspecified knee: Secondary | ICD-10-CM | POA: Insufficient documentation

## 2013-06-12 DIAGNOSIS — Z87891 Personal history of nicotine dependence: Secondary | ICD-10-CM | POA: Insufficient documentation

## 2013-06-12 DIAGNOSIS — Z85828 Personal history of other malignant neoplasm of skin: Secondary | ICD-10-CM | POA: Insufficient documentation

## 2013-06-12 DIAGNOSIS — I10 Essential (primary) hypertension: Secondary | ICD-10-CM | POA: Insufficient documentation

## 2013-06-12 DIAGNOSIS — Z885 Allergy status to narcotic agent status: Secondary | ICD-10-CM | POA: Insufficient documentation

## 2013-06-12 DIAGNOSIS — IMO0002 Reserved for concepts with insufficient information to code with codable children: Secondary | ICD-10-CM | POA: Insufficient documentation

## 2013-06-12 DIAGNOSIS — Y9364 Activity, baseball: Secondary | ICD-10-CM | POA: Insufficient documentation

## 2013-06-12 DIAGNOSIS — K219 Gastro-esophageal reflux disease without esophagitis: Secondary | ICD-10-CM | POA: Insufficient documentation

## 2013-06-12 HISTORY — DX: Unspecified asthma, uncomplicated: J45.909

## 2013-06-12 HISTORY — PX: KNEE ARTHROSCOPY: SHX127

## 2013-06-12 HISTORY — DX: Other seasonal allergic rhinitis: J30.2

## 2013-06-12 HISTORY — DX: Pleurisy: R09.1

## 2013-06-12 LAB — POCT HEMOGLOBIN-HEMACUE: Hemoglobin: 15.7 g/dL (ref 13.0–17.0)

## 2013-06-12 SURGERY — ARTHROSCOPY, KNEE
Anesthesia: General | Site: Knee | Laterality: Left | Wound class: Clean

## 2013-06-12 MED ORDER — LIDOCAINE HCL (CARDIAC) 20 MG/ML IV SOLN
INTRAVENOUS | Status: DC | PRN
  Administered 2013-06-12: 100 mg via INTRAVENOUS

## 2013-06-12 MED ORDER — OXYCODONE HCL 5 MG PO TABS
5.0000 mg | ORAL_TABLET | Freq: Once | ORAL | Status: AC | PRN
  Administered 2013-06-12: 5 mg via ORAL

## 2013-06-12 MED ORDER — MIDAZOLAM HCL 2 MG/2ML IJ SOLN: 1.0000 mg | INTRAMUSCULAR | Status: DC | PRN

## 2013-06-12 MED ORDER — ONDANSETRON HCL 4 MG/2ML IJ SOLN
INTRAMUSCULAR | Status: DC | PRN
  Administered 2013-06-12: 4 mg via INTRAVENOUS

## 2013-06-12 MED ORDER — FENTANYL CITRATE 0.05 MG/ML IJ SOLN
INTRAMUSCULAR | Status: DC | PRN
  Administered 2013-06-12: 50 ug via INTRAVENOUS
  Administered 2013-06-12: 100 ug via INTRAVENOUS

## 2013-06-12 MED ORDER — CEFAZOLIN SODIUM-DEXTROSE 2-3 GM-% IV SOLR
INTRAVENOUS | Status: DC | PRN
  Administered 2013-06-12: 2 g via INTRAVENOUS

## 2013-06-12 MED ORDER — DEXAMETHASONE SODIUM PHOSPHATE 4 MG/ML IJ SOLN
INTRAMUSCULAR | Status: DC | PRN
  Administered 2013-06-12: 10 mg via INTRAVENOUS

## 2013-06-12 MED ORDER — BUPIVACAINE-EPINEPHRINE 0.5% -1:200000 IJ SOLN
INTRAMUSCULAR | Status: DC | PRN
  Administered 2013-06-12: 20 mL

## 2013-06-12 MED ORDER — HYDROCODONE-ACETAMINOPHEN 10-325 MG PO TABS: 1.0000 | ORAL_TABLET | Freq: Four times a day (QID) | ORAL | Status: DC | PRN

## 2013-06-12 MED ORDER — SODIUM CHLORIDE 0.9 % IR SOLN
Status: DC | PRN
  Administered 2013-06-12: 3000 mL

## 2013-06-12 MED ORDER — HYDROMORPHONE HCL PF 1 MG/ML IJ SOLN
0.2500 mg | INTRAMUSCULAR | Status: DC | PRN
  Administered 2013-06-12 (×2): 0.5 mg via INTRAVENOUS

## 2013-06-12 MED ORDER — ONDANSETRON HCL 4 MG/2ML IJ SOLN: 4.0000 mg | Freq: Once | INTRAMUSCULAR | Status: DC | PRN

## 2013-06-12 MED ORDER — PROPOFOL 10 MG/ML IV BOLUS
INTRAVENOUS | Status: DC | PRN
  Administered 2013-06-12: 250 mg via INTRAVENOUS

## 2013-06-12 MED ORDER — FENTANYL CITRATE 0.05 MG/ML IJ SOLN: 50.0000 ug | INTRAMUSCULAR | Status: DC | PRN

## 2013-06-12 MED ORDER — OXYCODONE HCL 5 MG/5ML PO SOLN: 5.0000 mg | Freq: Once | ORAL | Status: AC | PRN

## 2013-06-12 MED ORDER — LACTATED RINGERS IV SOLN
INTRAVENOUS | Status: DC
  Administered 2013-06-12: 07:00:00 via INTRAVENOUS

## 2013-06-12 SURGICAL SUPPLY — 37 items
BANDAGE ELASTIC 6 VELCRO ST LF (GAUZE/BANDAGES/DRESSINGS) ×2 IMPLANT
BANDAGE GAUZE ELAST BULKY 4 IN (GAUZE/BANDAGES/DRESSINGS) ×2 IMPLANT
BLADE CUDA 5.5 (BLADE) IMPLANT
BLADE GREAT WHITE 4.2 (BLADE) ×2 IMPLANT
CANISTER OMNI JUG 16 LITER (MISCELLANEOUS) IMPLANT
CANISTER SUCTION 2500CC (MISCELLANEOUS) IMPLANT
DRAPE ARTHROSCOPY W/POUCH 114 (DRAPES) ×2 IMPLANT
DRAPE U-SHAPE 47X51 STRL (DRAPES) ×2 IMPLANT
DRSG EMULSION OIL 3X3 NADH (GAUZE/BANDAGES/DRESSINGS) ×2 IMPLANT
DURAPREP 26ML APPLICATOR (WOUND CARE) ×2 IMPLANT
ELECT MENISCUS 165MM 90D (ELECTRODE) IMPLANT
ELECT REM PT RETURN 9FT ADLT (ELECTROSURGICAL)
ELECTRODE REM PT RTRN 9FT ADLT (ELECTROSURGICAL) IMPLANT
GLOVE BIO SURGEON STRL SZ8.5 (GLOVE) ×2 IMPLANT
GLOVE BIOGEL PI IND STRL 8 (GLOVE) ×1 IMPLANT
GLOVE BIOGEL PI IND STRL 8.5 (GLOVE) ×1 IMPLANT
GLOVE BIOGEL PI INDICATOR 8 (GLOVE) ×1
GLOVE BIOGEL PI INDICATOR 8.5 (GLOVE) ×1
GLOVE SS BIOGEL STRL SZ 8 (GLOVE) ×1 IMPLANT
GLOVE SUPERSENSE BIOGEL SZ 8 (GLOVE) ×1
GOWN PREVENTION PLUS XLARGE (GOWN DISPOSABLE) ×2 IMPLANT
GOWN PREVENTION PLUS XXLARGE (GOWN DISPOSABLE) ×2 IMPLANT
IV NS IRRIG 3000ML ARTHROMATIC (IV SOLUTION) IMPLANT
KNEE WRAP E Z 3 GEL PACK (MISCELLANEOUS) ×2 IMPLANT
PACK ARTHROSCOPY DSU (CUSTOM PROCEDURE TRAY) ×2 IMPLANT
PACK BASIN DAY SURGERY FS (CUSTOM PROCEDURE TRAY) ×2 IMPLANT
PENCIL BUTTON HOLSTER BLD 10FT (ELECTRODE) IMPLANT
SET ARTHROSCOPY TUBING (MISCELLANEOUS) ×2
SET ARTHROSCOPY TUBING LN (MISCELLANEOUS) ×1 IMPLANT
SHEET MEDIUM DRAPE 40X70 STRL (DRAPES) ×2 IMPLANT
SPONGE GAUZE 4X4 12PLY (GAUZE/BANDAGES/DRESSINGS) ×2 IMPLANT
SYR 3ML 18GX1 1/2 (SYRINGE) IMPLANT
TOWEL OR 17X24 6PK STRL BLUE (TOWEL DISPOSABLE) ×2 IMPLANT
TOWEL OR NON WOVEN STRL DISP B (DISPOSABLE) ×2 IMPLANT
WAND 30 DEG SABER W/CORD (SURGICAL WAND) IMPLANT
WAND STAR VAC 90 (SURGICAL WAND) IMPLANT
WATER STERILE IRR 1000ML POUR (IV SOLUTION) ×2 IMPLANT

## 2013-06-12 NOTE — Anesthesia Procedure Notes (Signed)
Procedure Name: LMA Insertion Performed by: Katrianna Friesenhahn, [place] Pre-anesthesia Checklist: Patient identified, Emergency Drugs available, Suction available and Patient being monitored Patient Re-evaluated:Patient Re-evaluated prior to inductionOxygen Delivery Method: Circle System Utilized Preoxygenation: Pre-oxygenation with 100% oxygen Intubation Type: IV induction Ventilation: Mask ventilation without difficulty LMA: LMA inserted LMA Size: 4.0 Number of attempts: 1 Airway Equipment and Method: bite block Placement Confirmation: positive ETCO2 Tube secured with: Tape Dental Injury: Teeth and Oropharynx as per pre-operative assessment      

## 2013-06-12 NOTE — Op Note (Signed)
[name], [name] NO.:  [id number]  MEDICAL RECORD NO.:  [id number]  LOCATION:                                 FACILITY:  PHYSICIAN:  Lubertha Basque. Ulrick Methot, M.D.DATE OF BIRTH:  1961-05-05  DATE OF PROCEDURE:  06/12/2013 DATE OF DISCHARGE:  06/12/2013                              OPERATIVE REPORT   PREOPERATIVE DIAGNOSES: 1. Left knee torn medial meniscus. 2. Left knee chondromalacia.  POSTOPERATIVE DIAGNOSES: 1. Left knee torn medial meniscus. 2. Left knee chondromalacia.  PROCEDURES: 1. Left knee partial medial meniscectomy. 2. Left knee abrasion chondroplasty, patellofemoral.  ANESTHESIA:  General block.  ATTENDING SURGEON:  Lubertha Basque. Jerl Santos, MD  ASSISTANT:  Lindwood Qua, PA  INDICATION FOR PROCEDURE:  The patient is a 52 year old man with a long history of left knee issues.  He had an ACL reconstruction and was 10 years ago and a subsequent arthroscopy about 5 years ago.  He was then to set with some knee pain and went through Supartz and PRP injections years ago.  In recent months he has had increased pain on the medial joint line after a softball incident.  By MRI scan, he has a new medial meniscus tear.  He is offered an arthroscopy, having failed conservative measures.  Informed operative consent was obtained after discussion of possible complications including reaction to anesthesia and infection.  SUMMARY OF FINDINGS AND PROCEDURE:  Under general anesthesia, an arthroscopy of the left knee was performed.  Suprapatellar pouch was benign while the patellofemoral joint exhibited some broad areas of grade 3 and 4 change through the intertrochlear groove.  A thorough chondroplasty was done along with abrasion to the bleeding bone and some small areas.  The medial compartment showed some grade 3 change with no exposed bone than I could appreciate.  Chondroplasties were done.  He did have a displaceable medial meniscus tear involving the  middle and posterior horn.  About 10% partial medial meniscectomy was done back to stable tissues.  ACL reconstruction looked to be intact.  The lateral compartment exhibited some mild fraying of the meniscus, but no true tear.  There were no degenerative changes in that compartment.  He was discharged home the same day.  DESCRIPTION OF PROCEDURE:  The patient was taken to the operating suite where general anesthetic was applied without difficulty.  He was positioned supine and prepped and draped in normal sterile fashion. After the administration of IV Kefzol and appropriate time-out, an arthroscopy of the left knee was performed through total of 2 portals. Findings were as noted above and procedure consisted predominantly the partial medial meniscectomy was done which was done with a shaver.  We then performed the chondroplasty and abrasion as indicated above.  The knee was thoroughly irrigated followed by placement of Marcaine with epinephrine and morphine.  Adaptic was placed over the portals followed by dry gauze and loose Ace wrap.  Estimated blood loss and fluids obtained from anesthesia records.  DISPOSITION:  The patient was extubated in the operating room and taken to recovery in stable addition.  He was to go home same day and follow up in the office closely.  I will contact him  by phone tonight.     Lubertha Basque Jerl Santos, M.D.     PGD/MEDQ  D:  06/12/2013  T:  06/12/2013  Job:  [id number]

## 2013-06-12 NOTE — Transfer of Care (Signed)
Immediate Anesthesia Transfer of Care Note  Patient: [name]  Procedure(s) Performed: Procedure(s): LEFT KNEE ARTHROSCOPY PARTIAL MEDIAL MENISECTOMY WITH CHONDROPLASTY (Left)  Patient Location: PACU  Anesthesia Type:General  Level of Consciousness: sedated  Airway & Oxygen Therapy: Patient Spontanous Breathing and Patient connected to face mask oxygen  Post-op Assessment: Report given to PACU RN and Post -op Vital signs reviewed and stable  Post vital signs: Reviewed and stable  Complications: No apparent anesthesia complications

## 2013-06-12 NOTE — Anesthesia Postprocedure Evaluation (Signed)
  Anesthesia Post-op Note  Patient: [name]  Procedure(s) Performed: Procedure(s): LEFT KNEE ARTHROSCOPY PARTIAL MEDIAL MENISECTOMY WITH CHONDROPLASTY (Left)  Patient Location: PACU  Anesthesia Type:General  Level of Consciousness: awake, alert  and oriented  Airway and Oxygen Therapy: Patient Spontanous Breathing  Post-op Pain: mild  Post-op Assessment: Post-op Vital signs reviewed  Post-op Vital Signs: Reviewed  Complications: No apparent anesthesia complications

## 2013-06-12 NOTE — Interval H&P Note (Signed)
History and Physical Interval Note:  06/12/2013 7:31 AM  [name]  has presented today for surgery, with the diagnosis of Left Knee Medial Mensical Tear  The various methods of treatment have been discussed with the patient and family. After consideration of risks, benefits and other options for treatment, the patient has consented to  Procedure(s): LEFT KNEE ARTHROSCOPY (Left) as a surgical intervention .  The patient's history has been reviewed, patient examined, no change in status, stable for surgery.  I have reviewed the patient's chart and labs.  Questions were answered to the patient's satisfaction.     [name] G

## 2013-06-12 NOTE — Op Note (Signed)
#  [id number] 

## 2013-06-12 NOTE — Anesthesia Preprocedure Evaluation (Addendum)
Anesthesia Evaluation  Patient identified by MRN, date of birth, ID band Patient awake    Reviewed: Allergy & Precautions, H&P , NPO status , Patient's Chart, lab work & pertinent test results  Airway Mallampati: I TM Distance: >3 FB Neck ROM: Full    Dental  (+) Teeth Intact and Dental Advisory Given   Pulmonary asthma (Occasional) ,  breath sounds clear to auscultation        Cardiovascular hypertension, Pt. on medications Rhythm:Regular Rate:Normal     Neuro/Psych    GI/Hepatic   Endo/Other    Renal/GU      Musculoskeletal   Abdominal   Peds  Hematology   Anesthesia Other Findings   Reproductive/Obstetrics                          Anesthesia Physical Anesthesia Plan  ASA: II  Anesthesia Plan: General   Post-op Pain Management:    Induction: Intravenous  Airway Management Planned: LMA  Additional Equipment:   Intra-op Plan:   Post-operative Plan: Extubation in OR  Informed Consent: I have reviewed the patients History and Physical, chart, labs and discussed the procedure including the risks, benefits and alternatives for the proposed anesthesia with the patient or authorized representative who has indicated his/her understanding and acceptance.   Dental advisory given  Plan Discussed with: Anesthesiologist, Surgeon and CRNA  Anesthesia Plan Comments:         Anesthesia Quick Evaluation

## 2013-06-13 ENCOUNTER — Encounter (HOSPITAL_BASED_OUTPATIENT_CLINIC_OR_DEPARTMENT_OTHER): Payer: Self-pay | Admitting: Orthopaedic Surgery

## 2014-05-01 ENCOUNTER — Telehealth: Payer: Self-pay | Admitting: Emergency Medicine

## 2014-05-01 DIAGNOSIS — J948 Other specified pleural conditions: Secondary | ICD-10-CM

## 2014-05-01 NOTE — Telephone Encounter (Signed)
LMTC x 1 - Need to schedule f/u with Byrum in May with Chest CT prior.

## 2014-05-02 NOTE — Telephone Encounter (Signed)
Called and spoke with pt and he is aware that order has been placed for the ct to be scheduled.  Pt is aware that he will need appt with RB after the ct is done.  Will call pt back to set up appt with RB once the ct is scheduled.  Pt is aware.

## 2014-05-06 NOTE — Telephone Encounter (Signed)
I spoke with the pt and notified of CT date, time and location  He verbalized understanding and denied any questions  OV with RB set up for 05/17/14 at 11:30 am

## 2014-05-06 NOTE — Telephone Encounter (Signed)
[facility], can you please advise once the pt CT is scheduled? The order was placed on 05-02-14 and I do not see that it has been set yet. Thanks!

## 2014-05-06 NOTE — Telephone Encounter (Signed)
appt is scheduled for 05/14/14@1 :30pm@[facility]  will you tell him when you set up his flu appt with DR BYRUM?

## 2014-05-14 ENCOUNTER — Ambulatory Visit (INDEPENDENT_AMBULATORY_CARE_PROVIDER_SITE_OTHER)
Admission: RE | Admit: 2014-05-14 | Discharge: 2014-05-14 | Disposition: A | Discharge status: Home / Self Care | Payer: BC Managed Care – PPO | Source: Ambulatory Visit | Attending: Emergency Medicine | Admitting: Emergency Medicine

## 2014-05-14 DIAGNOSIS — J948 Other specified pleural conditions: Secondary | ICD-10-CM

## 2014-05-14 DIAGNOSIS — R091 Pleurisy: Secondary | ICD-10-CM

## 2014-05-14 MED ORDER — IOHEXOL 300 MG/ML  SOLN
80.0000 mL | Freq: Once | INTRAMUSCULAR | Status: AC | PRN
  Administered 2014-05-14: 80 mL via INTRAVENOUS

## 2014-05-17 ENCOUNTER — Ambulatory Visit (INDEPENDENT_AMBULATORY_CARE_PROVIDER_SITE_OTHER): Payer: BC Managed Care – PPO | Admitting: Emergency Medicine

## 2014-05-17 ENCOUNTER — Encounter: Payer: Self-pay | Admitting: Emergency Medicine

## 2014-05-17 VITALS — BP 122/86 | HR 81 | Ht 69.0 in | Wt 202.0 lb

## 2014-05-17 DIAGNOSIS — J309 Allergic rhinitis, unspecified: Secondary | ICD-10-CM

## 2014-05-17 DIAGNOSIS — J45909 Unspecified asthma, uncomplicated: Secondary | ICD-10-CM

## 2014-05-17 DIAGNOSIS — J4520 Mild intermittent asthma, uncomplicated: Secondary | ICD-10-CM

## 2014-05-17 DIAGNOSIS — J948 Other specified pleural conditions: Secondary | ICD-10-CM

## 2014-05-17 DIAGNOSIS — R911 Solitary pulmonary nodule: Secondary | ICD-10-CM | POA: Insufficient documentation

## 2014-05-17 DIAGNOSIS — R091 Pleurisy: Secondary | ICD-10-CM

## 2014-05-17 NOTE — Patient Instructions (Signed)
Please continue your nasal steroid spray twice a day Use albuterol 2 puffs as needed We will repeat your Ct scan of the chest in 1 year. Please call next April so we can set it up.  Follow with Dr Lamonte Sakai in 12 months or sooner if you have any problems

## 2014-05-17 NOTE — Assessment & Plan Note (Signed)
Recent flare that was treated with brief prednisone, now stable back on nasal steroid.

## 2014-05-17 NOTE — Assessment & Plan Note (Signed)
Unchanged on CT chest from 5/'15

## 2014-05-17 NOTE — Progress Notes (Signed)
Subjective:     Patient ID: [name], male   DOB: 03/12/1961, 53 y.o.   MRN: [id number]  HPI 53 yo former smoker (1-2 pk-yrs), hx HTN, allergic rhinitis. 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 He was dx with PNA and pleurisy in 12/13. CT chest at that time > Per his report he was evaluated for cardiac CP - negative.  CXR not available to me right now, but f/u CXR 3/'14 showed possible lingular and L basilar scar. His CP never really resolved but was better. Has severe allergies, GERD, has continued to cough all along, but now worse during the pollen season He is on Protonix, increased to 40mg  last year. Started asmanex and albuterol  He is usually active, but is now having exertional SOB. Has palpitations and dizziness. He is having nausea, vomiting, GERD sx.   Acute OV 05/28/13 --  Complains of sharp pain in left lung-in lower posterior back, increased SOB, hoarseness, wheezing onset this AM.  Will finish zpak tomorrow from PCP. CT chest 05/23/13 showed No acute cardiopulmonary abnormalities. . Left base scarring.  3 mm nodule in the right middle lobe CXR today shows Stable trace linear pleural parenchymal scarring in the lingula. The lungs are otherwise clear Congestion is improved with clear mucus.  Took 6 vicodin this am for pain. Advised on dangers of over medication .  Has upcoming PFT. No hemoptysis, edema, calf pain, fever or orthopnea.   ROV 06/08/13 -- follows up for L flank and back pain. Had PFT today - mild mixed disease with a BD response, normal volumes, normal DLCO. We changed lisinopril to losartan, started loratadine, doubled protonix x 10 days.  He is supposed to have L knee sgy soon - is cleared from my standpoint.    ROV 05/17/14 -- follows for allergies and mild AFL / asthma. He had have a pred taper recently by his allergist > ran out of his nasal steroid temporarily. He is  now back on it and is better. Rarely needs SABA.    PULMONARY FUNCTON TEST 06/08/2013  FVC 3.56  FEV1 2.78  FEV1/FVC 78.1  FVC  % Predicted 73  FEV % Predicted 74  FeF 25-75 3.5  FeF 25-75 % Predicted 4.63      Objective:   Physical Exam  Filed Vitals:   05/17/14 1132  BP: 122/86  Pulse: 81  Height: 5\' 9"  (1.753 m)  Weight: 202 lb (91.627 kg)  SpO2: 97%   Gen: Pleasant, well-nourished, in no distress,  normal affect  ENT: No lesions,  mouth clear,  oropharynx clear, no postnasal drip  Neck: No JVD, no TMG, no carotid bruits  Lungs: No use of accessory muscles,  No wheezing noted.  Tender along posterior left chest /back. No rash/deformity noted.   Cardiovascular: RRR, heart sounds normal, no murmur or gallops, no peripheral edema  Musculoskeletal: No deformities, no cyanosis or clubbing  Neuro: alert, non focal  Skin: Warm, no lesions or rashes  CT chest 05/02/14 -  COMPARISON: 05/15/2013  FINDINGS:  Scarring is identified within the lingular portion of the left lung.  3 mm right middle lobe nodule is unchanged, image 34/series 3.  The heart size is normal. No pericardial effusion. No enlarged  mediastinal or hilar lymph nodes identified. There is a 1.1 cm right  axillary node, image 7/series 2. Previously 6 mm. No supraclavicular  adenopathy.  Previous cholecystectomy.  No acute findings within the upper  abdomen. Review of the visualized osseous structures is significant  for mild thoracic spondylosis.  IMPRESSION:  1. No acute cardiopulmonary abnormalities.  2. Stable 3 mm right middle lobe nodule. This is compatible with a  benign abnormality.      Assessment:  Pleural scarring Unchanged on CT chest from 5/'15  Allergic rhinitis Recent flare that was treated with brief prednisone, now stable back on nasal steroid.   Mild intermittent asthma Stable, especially as long as his allergies are well controlled.  - SABA prn  Pulmonary nodule 38mm RUL  nodule on CT scan is stable over a year. Typically would not merit follow up in pt with his smoking hx, but he has been exposed to water at [facility] which has now been associated with increased frequency of malignancies including lung, skin and others. Given this I would prefer to repeat CT one more time in a year. He will call in April '16 for [place] to arrange.

## 2014-05-17 NOTE — Assessment & Plan Note (Signed)
89mm RUL nodule on CT scan is stable over a year. Typically would not merit follow up in pt with his smoking hx, but he has been exposed to water at [facility] [address] which has now been associated with increased frequency of malignancies including lung, skin and others. Given this I would prefer to repeat CT one more time in a year. He will call in April '16 for [place] to arrange.

## 2014-05-17 NOTE — Assessment & Plan Note (Signed)
Stable, especially as long as his allergies are well controlled.  - SABA prn

## 2014-09-04 DIAGNOSIS — C4490 Unspecified malignant neoplasm of skin, unspecified: Secondary | ICD-10-CM | POA: Insufficient documentation

## 2015-03-27 ENCOUNTER — Telehealth: Payer: Self-pay

## 2015-03-27 NOTE — Telephone Encounter (Signed)
03/27/15 Disc received form [facility] and filed on shelf. [name]

## 2015-04-11 ENCOUNTER — Other Ambulatory Visit: Payer: Self-pay | Admitting: Emergency Medicine

## 2015-04-11 DIAGNOSIS — J948 Other specified pleural conditions: Secondary | ICD-10-CM

## 2015-05-15 ENCOUNTER — Ambulatory Visit (INDEPENDENT_AMBULATORY_CARE_PROVIDER_SITE_OTHER)
Admission: RE | Admit: 2015-05-15 | Discharge: 2015-05-15 | Disposition: A | Discharge status: Home / Self Care | Payer: BLUE CROSS/BLUE SHIELD | Source: Ambulatory Visit | Attending: Emergency Medicine | Admitting: Emergency Medicine

## 2015-05-15 DIAGNOSIS — R091 Pleurisy: Secondary | ICD-10-CM | POA: Diagnosis not present

## 2015-05-15 DIAGNOSIS — J948 Other specified pleural conditions: Secondary | ICD-10-CM

## 2015-05-15 MED ORDER — IOHEXOL 300 MG/ML  SOLN
80.0000 mL | Freq: Once | INTRAMUSCULAR | Status: AC | PRN
  Administered 2015-05-15: 80 mL via INTRAVENOUS

## 2015-05-21 ENCOUNTER — Encounter: Payer: Self-pay | Admitting: Emergency Medicine

## 2015-05-21 ENCOUNTER — Ambulatory Visit (INDEPENDENT_AMBULATORY_CARE_PROVIDER_SITE_OTHER): Payer: BLUE CROSS/BLUE SHIELD | Admitting: Emergency Medicine

## 2015-05-21 VITALS — BP 150/102 | HR 90 | Ht 69.0 in | Wt 199.0 lb

## 2015-05-21 DIAGNOSIS — J4520 Mild intermittent asthma, uncomplicated: Secondary | ICD-10-CM | POA: Diagnosis not present

## 2015-05-21 DIAGNOSIS — J309 Allergic rhinitis, unspecified: Secondary | ICD-10-CM | POA: Diagnosis not present

## 2015-05-21 DIAGNOSIS — R911 Solitary pulmonary nodule: Secondary | ICD-10-CM | POA: Diagnosis not present

## 2015-05-21 NOTE — Patient Instructions (Signed)
Your CT scan is stable. You do not need to have any more CT scans unless there is a clinical change Please keep your albuterol available to use if needed Continue your Benadryl and fluticasone nasal spray. Follow with Dr Lamonte Sakai in 12 months or sooner if you have any problems

## 2015-05-21 NOTE — Assessment & Plan Note (Signed)
3 mm nodule has been stable for 2 years. He does not need any serial scans. He does not meet criteria for low-dose CT scan screening based on his minimal smoking history. I will not perform a new CT scan unless he has a clinical change

## 2015-05-21 NOTE — Assessment & Plan Note (Signed)
Stable at this time. He knows to use albuterol if needed

## 2015-05-21 NOTE — Progress Notes (Signed)
Subjective:     Patient ID: [name], male   DOB: 1961/09/12, 54 y.o.   MRN: [id number]  HPI 54 yo former smoker (1-2 pk-yrs), hx HTN, allergic rhinitis. 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 He was dx with PNA and pleurisy in 12/13. CT chest at that time > Per his report he was evaluated for cardiac CP - negative.  CXR not available to me right now, but f/u CXR 3/'14 showed possible lingular and L basilar scar. His CP never really resolved but was better. Has severe allergies, GERD, has continued to cough all along, but now worse during the pollen season He is on Protonix, increased to 40mg  last year. Started asmanex and albuterol  He is usually active, but is now having exertional SOB. Has palpitations and dizziness. He is having nausea, vomiting, GERD sx.   Acute OV 05/28/13 --  Complains of sharp pain in left lung-in lower posterior back, increased SOB, hoarseness, wheezing onset this AM.  Will finish zpak tomorrow from PCP. CT chest 05/23/13 showed No acute cardiopulmonary abnormalities. . Left base scarring.  3 mm nodule in the right middle lobe CXR today shows Stable trace linear pleural parenchymal scarring in the lingula. The lungs are otherwise clear Congestion is improved with clear mucus.  Took 6 vicodin this am for pain. Advised on dangers of over medication .  Has upcoming PFT. No hemoptysis, edema, calf pain, fever or orthopnea.   ROV 06/08/13 -- follows up for L flank and back pain. Had PFT today - mild mixed disease with a BD response, normal volumes, normal DLCO. We changed lisinopril to losartan, started loratadine, doubled protonix x 10 days.  He is supposed to have L knee sgy soon - is cleared from my standpoint.    ROV 05/17/14 -- follows for allergies and mild AFL / asthma. He had have a pred taper recently by his allergist > ran out of his nasal steroid temporarily. He is  now back on it and is better. Rarely needs SABA.   ROV 05/21/15 -- follow-up visit for allergic rhinitis, mild obstructive lung disease/asthma. He has a 3 mm right upper lobe nodule that has been stable for a year based on last scan in 2015. I decided to repeat the scan because he has other exposures  that are potentially carcinogenic. I personally reviewed his scan from 05/15/15. This shows a stable area of lingular scar as well as no change in his 3 mm right middle lobe nodule. He is on benadryl and fluticasone. Has not needed SABA at all.    PULMONARY FUNCTON TEST 06/08/2013  FVC 3.56  FEV1 2.78  FEV1/FVC 78.1  FVC  % Predicted 73  FEV % Predicted 74  FeF 25-75 3.5  FeF 25-75 % Predicted 4.63      Objective:   Physical Exam  Filed Vitals:   05/21/15 1345 05/21/15 1346  BP:  150/102  Pulse:  90  Height: 5\' 9"  (1.753 m)   Weight: 199 lb (90.266 kg)   SpO2:  97%   Gen: Pleasant, well-nourished, in no distress,  normal affect  ENT: No lesions,  mouth clear,  oropharynx clear, no postnasal drip  Neck: No JVD, no TMG, no carotid bruits  Lungs: No use of accessory muscles,  No wheezing noted.  Tender along posterior left chest /back. No rash/deformity noted.   Cardiovascular: RRR, heart sounds normal, no murmur  or gallops, no peripheral edema  Musculoskeletal: No deformities, no cyanosis or clubbing  Neuro: alert, non focal  Skin: Warm, no lesions or rashes  CT chest 05/02/14 -  COMPARISON: 05/15/2013  FINDINGS:  Scarring is identified within the lingular portion of the left lung.  3 mm right middle lobe nodule is unchanged, image 34/series 3.  The heart size is normal. No pericardial effusion. No enlarged  mediastinal or hilar lymph nodes identified. There is a 1.1 cm right  axillary node, image 7/series 2. Previously 6 mm. No supraclavicular  adenopathy.  Previous cholecystectomy. No acute findings within the upper  abdomen. Review of the visualized osseous structures is  significant  for mild thoracic spondylosis.  IMPRESSION:  1. No acute cardiopulmonary abnormalities.  2. Stable 3 mm right middle lobe nodule. This is compatible with a  benign abnormality.      Assessment:  Pulmonary nodule 3 mm nodule has been stable for 2 years. He does not need any serial scans. He does not meet criteria for low-dose CT scan screening based on his minimal smoking history. I will not perform a new CT scan unless he has a clinical change   Mild intermittent asthma Stable at this time. He knows to use albuterol if needed   Allergic rhinitis He does have some breakthrough symptoms. He is currently using Benadryl daily at bedtime and fluticasone once a day. We discussed increasing the fluticasone if his symptoms worsen.

## 2015-05-21 NOTE — Assessment & Plan Note (Signed)
He does have some breakthrough symptoms. He is currently using Benadryl daily at bedtime and fluticasone once a day. We discussed increasing the fluticasone if his symptoms worsen.

## 2015-12-08 ENCOUNTER — Ambulatory Visit (INDEPENDENT_AMBULATORY_CARE_PROVIDER_SITE_OTHER): Payer: BLUE CROSS/BLUE SHIELD | Admitting: Family

## 2015-12-08 ENCOUNTER — Encounter: Payer: Self-pay | Admitting: Family

## 2015-12-08 VITALS — BP 136/86 | HR 77 | Temp 98.5°F | Resp 18 | Ht 69.0 in | Wt 202.8 lb

## 2015-12-08 DIAGNOSIS — K649 Unspecified hemorrhoids: Secondary | ICD-10-CM | POA: Diagnosis not present

## 2015-12-08 DIAGNOSIS — F909 Attention-deficit hyperactivity disorder, unspecified type: Secondary | ICD-10-CM

## 2015-12-08 DIAGNOSIS — I10 Essential (primary) hypertension: Secondary | ICD-10-CM

## 2015-12-08 DIAGNOSIS — F988 Other specified behavioral and emotional disorders with onset usually occurring in childhood and adolescence: Secondary | ICD-10-CM

## 2015-12-08 MED ORDER — HYDROCORTISONE 2.5 % RE CREA: 1.0000 "application " | TOPICAL_CREAM | Freq: Two times a day (BID) | RECTAL | Status: DC

## 2015-12-08 MED ORDER — LISINOPRIL 20 MG PO TABS: 20.0000 mg | ORAL_TABLET | Freq: Every day | ORAL | Status: DC

## 2015-12-08 MED ORDER — AMLODIPINE BESYLATE 10 MG PO TABS: 10.0000 mg | ORAL_TABLET | Freq: Every day | ORAL | Status: DC

## 2015-12-08 NOTE — Patient Instructions (Signed)
Thank you for choosing [facility].  Summary/Instructions:  Your prescription(s) have been submitted to your pharmacy or been printed and provided for you. Please take as directed and contact our office if you believe you are having problem(s) with the medication(s) or have any questions.  If your symptoms worsen or fail to improve, please contact our office for further instruction, or in case of emergency go directly to the emergency room at the closest medical facility.   Hemorrhoids Hemorrhoids are swollen veins around the rectum or anus. There are two types of hemorrhoids:   Internal hemorrhoids. These occur in the veins just inside the rectum. They may poke through to the outside and become irritated and painful.  External hemorrhoids. These occur in the veins outside the anus and can be felt as a painful swelling or hard lump near the anus. CAUSES  Pregnancy.   Obesity.   Constipation or diarrhea.   Straining to have a bowel movement.   Sitting for long periods on the toilet.  Heavy lifting or other activity that caused you to strain.  Anal intercourse. SYMPTOMS   Pain.   Anal itching or irritation.   Rectal bleeding.   Fecal leakage.   Anal swelling.   One or more lumps around the anus.  DIAGNOSIS  Your caregiver may be able to diagnose hemorrhoids by visual examination. Other examinations or tests that may be performed include:   Examination of the rectal area with a gloved hand (digital rectal exam).   Examination of anal canal using a small tube (scope).   A blood test if you have lost a significant amount of blood.  A test to look inside the colon (sigmoidoscopy or colonoscopy). TREATMENT Most hemorrhoids can be treated at home. However, if symptoms do not seem to be getting better or if you have a lot of rectal bleeding, your caregiver may perform a procedure to help make the hemorrhoids get smaller or remove them completely.  Possible treatments include:   Placing a rubber band at the base of the hemorrhoid to cut off the circulation (rubber band ligation).   Injecting a chemical to shrink the hemorrhoid (sclerotherapy).   Using a tool to burn the hemorrhoid (infrared light therapy).   Surgically removing the hemorrhoid (hemorrhoidectomy).   Stapling the hemorrhoid to block blood flow to the tissue (hemorrhoid stapling).  HOME CARE INSTRUCTIONS   Eat foods with fiber, such as whole grains, beans, nuts, fruits, and vegetables. Ask your doctor about taking products with added fiber in them (fibersupplements).  Increase fluid intake. Drink enough water and fluids to keep your urine clear or pale yellow.   Exercise regularly.   Go to the bathroom when you have the urge to have a bowel movement. Do not wait.   Avoid straining to have bowel movements.   Keep the anal area dry and clean. Use wet toilet paper or moist towelettes after a bowel movement.   Medicated creams and suppositories may be used or applied as directed.   Only take over-the-counter or prescription medicines as directed by your caregiver.   Take warm sitz baths for 15-20 minutes, 3-4 times a day to ease pain and discomfort.   Place ice packs on the hemorrhoids if they are tender and swollen. Using ice packs between sitz baths may be helpful.   Put ice in a plastic bag.   Place a towel between your skin and the bag.   Leave the ice on for 15-20 minutes, 3-4 times a  day.   Do not use a donut-shaped pillow or sit on the toilet for long periods. This increases blood pooling and pain.  SEEK MEDICAL CARE IF:  You have increasing pain and swelling that is not controlled by treatment or medicine.  You have uncontrolled bleeding.  You have difficulty or you are unable to have a bowel movement.  You have pain or inflammation outside the area of the hemorrhoids. MAKE SURE YOU:  Understand these instructions.  Will  watch your condition.  Will get help right away if you are not doing well or get worse.   This information is not intended to replace advice given to you by your health care provider. Make sure you discuss any questions you have with your health care provider.   Document Released: 12/10/2000 Document Revised: 11/29/2012 Document Reviewed: 10/17/2012 Elsevier Interactive Patient Education [employer].

## 2015-12-08 NOTE — Assessment & Plan Note (Signed)
Stable and currently managed by attention specialist who will continue to prescribed medication. This complicates his management of hypertension. Continue medication at this time and changes per attention specialist.

## 2015-12-08 NOTE — Assessment & Plan Note (Signed)
Blood pressure today is below goal 140/90, however blood pressure readings at home have been averaging 150/90 which is greater than goal. Increase amlodipine to 10 mg. Continue current dosage of lisinopril. Continue to monitor blood pressure at home. Follow-up annual physical.

## 2015-12-08 NOTE — Progress Notes (Signed)
Pre visit review using our clinic review tool, if applicable. No additional management support is needed unless otherwise documented below in the visit note. 

## 2015-12-08 NOTE — Progress Notes (Signed)
Subjective:    Patient ID: [name], male    DOB: Aug 19, 1961, 54 y.o.   MRN: [id number]  Chief Complaint  Patient presents with  . Establish Care    hemorroid, refill of lisinopril and amlodipine    HPI:  [name] is a 54 y.o. male who  has a past medical history of Hypertension; GERD (gastroesophageal reflux disease); Rectal fissure; Seasonal allergies; Pleurisy; Skin cancer; Asthma; Allergy; and Colon polyps. and presents today for an office visit to establish care.   1.) Hypertension - Currently maintained on amlodipine and lisinopril. Takes the medications as prescribed and denies adverse side effects or cough. Denies symptoms of end organ damage. Blood pressure at home averages around 150/90. This may also be complicated for ADD medication.  BP Readings from Last 3 Encounters:  12/08/15 136/86  05/21/15 150/102  05/17/14 122/86    2.) Hemorrhoid - Associated symptom of rectal itching and burning has been going on for about 2 weeks. Notes a small amount of blood with bowel movements. Describes that he was constipated and drank a little coffee and lemon juice which seemed to help. Does not usually have constipation and describes no straining with bowel movements. Modifying factors include Tuck's medicated pads and Preparation H which does seem to help a little.    3.) ADD - Currently maintained on Adderall that he takes on an as needed basis. Last testing was done in 2013 and has been seeing attention specialist who will continue to prescribe the medication at this time.    Allergies  Allergen Reactions  . Codeine Nausea Only    Outpatient Prescriptions Prior to Visit  Medication Sig Dispense Refill  . albuterol (PROVENTIL HFA;VENTOLIN HFA) 108 (90 BASE) MCG/ACT inhaler Inhale 2 puffs into the lungs every 4 (four) hours as needed for wheezing.    [name] amphetamine-dextroamphetamine (ADDERALL) 30 MG tablet Take 30 mg by mouth daily as needed.    . calcium carbonate  (TUMS - DOSED IN MG ELEMENTAL CALCIUM) 500 MG chewable tablet Chew 1-2 tablets by mouth as needed for heartburn.    . escitalopram (LEXAPRO) 10 MG tablet Take 10 mg by mouth daily.    . fluticasone (FLONASE) 50 MCG/ACT nasal spray Place 2 sprays into the nose daily.    . Misc Natural Products (OSTEO BI-FLEX JOINT SHIELD) TABS Take 1 tablet by mouth daily.    . pantoprazole (PROTONIX) 40 MG tablet Take 40 mg by mouth daily.    [name] amLODipine (NORVASC) 5 MG tablet Take 5 mg by mouth daily.    . B Complex-C-Folic Acid (STRESS B COMPLEX PO) Take 1 tablet by mouth daily.    [name] HYDROcodone-acetaminophen (NORCO) 10-325 MG per tablet Take 1 tablet by mouth every 6 (six) hours as needed for pain. 40 tablet 0  . losartan (COZAAR) 50 MG tablet Take 1 tablet (50 mg total) by mouth daily. 30 tablet 3   No facility-administered medications prior to visit.     Past Medical History  Diagnosis Date  . Hypertension   . GERD (gastroesophageal reflux disease)   . Rectal fissure   . Seasonal allergies   . Pleurisy     hx-12/13  . Skin cancer   . Asthma     bronchitis  . Allergy   . Colon polyps      Past Surgical History  Procedure Laterality Date  . Knee surgery  2005    lt acl  . Shoulder surgery  2010  left  . Hemorrhoid surgery  2005  . Appendectomy  2006    lap append  . Knee arthroscopy  2009    left  . Cholecystectomy    . Nasal fracture surgery  2012    closed reduction   . Knee arthroscopy Left 06/12/2013    Procedure: LEFT KNEE ARTHROSCOPY PARTIAL MEDIAL MENISECTOMY WITH CHONDROPLASTY;  Surgeon: Hessie Dibble, MD;  Location: [facility];  Service: Orthopedics;  Laterality: Left;     Family History  Problem Relation Age of Onset  . Heart attack Mother   . Skin cancer Mother   . Hypertension Mother   . Hyperlipidemia Mother   . Stroke Mother   . Heart attack Father   . Hyperlipidemia Brother   . Bipolar disorder Maternal Grandmother   . Arthritis Maternal  Grandmother   . Hyperlipidemia Maternal Grandmother   . Hypertension Maternal Grandmother   . Arthritis Maternal Grandfather   . Hyperlipidemia Maternal Grandfather   . Stroke Maternal Grandfather   . Hypertension Maternal Grandfather   . Arthritis Paternal Grandmother   . Hypertension Paternal Grandmother   . Arthritis Paternal Grandfather   . Hypertension Paternal Grandfather      Social History   Social History  . Marital Status: Married    Spouse Name: N/A  . Number of Children: 5  . Years of Education: 13   Occupational History  . landscaping    Social History Main Topics  . Smoking status: Former Smoker -- 0.25 packs/day for 4 years    Types: Cigarettes, Cigars    Quit date: 12/27/1984  . Smokeless tobacco: Never Used     Comment: occasional  . Alcohol Use: Yes     Comment: occ  . Drug Use: 2.00 per week    Special: Marijuana  . Sexual Activity: Not on file     Comment: occ cigars-quit cigs   Other Topics Concern  . Not on file   Social History Narrative   Former Company secretary     Review of Systems  Constitutional: Negative for fever, chills, diaphoresis, appetite change and unexpected weight change.  Eyes:       Negative for changes in vision  Respiratory: Negative for chest tightness and shortness of breath.   Cardiovascular: Negative for chest pain, palpitations and leg swelling.  Neurological: Negative for headaches.  Psychiatric/Behavioral: Negative for sleep disturbance and decreased concentration. The patient is not nervous/anxious.       Objective:    BP 136/86 mmHg  Pulse 77  Temp(Src) 98.5 F (36.9 C) (Oral)  Resp 18  Ht 5\' 9"  (1.753 m)  Wt 202 lb 12.8 oz (91.989 kg)  BMI 29.93 kg/m2  SpO2 97% Nursing note and vital signs reviewed.  Physical Exam  Constitutional: He is oriented to person, place, and time. He appears well-developed and well-nourished. No distress.  Cardiovascular: Normal rate, regular rhythm, normal heart sounds and intact  distal pulses.   Pulmonary/Chest: Effort normal and breath sounds normal.  Genitourinary: Rectal exam shows external hemorrhoid. Rectal exam shows no fissure, no mass and no tenderness.  Neurological: He is alert and oriented to person, place, and time.  Skin: Skin is warm and dry.  Psychiatric: He has a normal mood and affect. His behavior is normal. Judgment and thought content normal.       Assessment & Plan:   Problem List Items Addressed This Visit      Cardiovascular and Mediastinum   Essential hypertension - Primary  Blood pressure today is below goal 140/90, however blood pressure readings at home have been averaging 150/90 which is greater than goal. Increase amlodipine to 10 mg. Continue current dosage of lisinopril. Continue to monitor blood pressure at home. Follow-up annual physical.      Relevant Medications   lisinopril (PRINIVIL,ZESTRIL) 20 MG tablet   amLODipine (NORVASC) 10 MG tablet   Hemorrhoids    Symptoms and exam consistent with hemorrhoid with no evidence of thrombosis or anal fistula. Start hydrocortisone 2.5% rectal cream. Discussed possible surgical removal in the future if warranted. Educated regarding the importance of avoiding constipation. Follow-up if symptoms worsen or fail to improve.       Relevant Medications   lisinopril (PRINIVIL,ZESTRIL) 20 MG tablet   amLODipine (NORVASC) 10 MG tablet   hydrocortisone (ANUSOL-HC) 2.5 % rectal cream     Other   ADD (attention deficit disorder)    Stable and currently managed by attention specialist who will continue to prescribed medication. This complicates his management of hypertension. Continue medication at this time and changes per attention specialist.

## 2015-12-08 NOTE — Assessment & Plan Note (Signed)
Symptoms and exam consistent with hemorrhoid with no evidence of thrombosis or anal fistula. Start hydrocortisone 2.5% rectal cream. Discussed possible surgical removal in the future if warranted. Educated regarding the importance of avoiding constipation. Follow-up if symptoms worsen or fail to improve.

## 2015-12-15 ENCOUNTER — Encounter: Payer: Self-pay | Admitting: Family

## 2015-12-15 ENCOUNTER — Ambulatory Visit (INDEPENDENT_AMBULATORY_CARE_PROVIDER_SITE_OTHER): Payer: BLUE CROSS/BLUE SHIELD | Admitting: Family

## 2015-12-15 ENCOUNTER — Ambulatory Visit (INDEPENDENT_AMBULATORY_CARE_PROVIDER_SITE_OTHER)
Admission: RE | Admit: 2015-12-15 | Discharge: 2015-12-15 | Disposition: A | Discharge status: Home / Self Care | Payer: BLUE CROSS/BLUE SHIELD | Source: Ambulatory Visit | Attending: Family | Admitting: Family

## 2015-12-15 ENCOUNTER — Other Ambulatory Visit (INDEPENDENT_AMBULATORY_CARE_PROVIDER_SITE_OTHER): Payer: BLUE CROSS/BLUE SHIELD

## 2015-12-15 VITALS — BP 120/88 | HR 101 | Temp 98.1°F | Resp 18 | Ht 69.0 in | Wt 203.0 lb

## 2015-12-15 DIAGNOSIS — R7989 Other specified abnormal findings of blood chemistry: Secondary | ICD-10-CM

## 2015-12-15 DIAGNOSIS — Z0000 Encounter for general adult medical examination without abnormal findings: Secondary | ICD-10-CM | POA: Diagnosis not present

## 2015-12-15 DIAGNOSIS — M79644 Pain in right finger(s): Secondary | ICD-10-CM

## 2015-12-15 DIAGNOSIS — R6889 Other general symptoms and signs: Secondary | ICD-10-CM | POA: Diagnosis not present

## 2015-12-15 DIAGNOSIS — Z0001 Encounter for general adult medical examination with abnormal findings: Secondary | ICD-10-CM

## 2015-12-15 LAB — LIPID PANEL
Cholesterol: 185 mg/dL (ref 0–200)
HDL: 45.1 mg/dL
NonHDL: 139.4
Total CHOL/HDL Ratio: 4
Triglycerides: 376 mg/dL — ABNORMAL HIGH (ref 0.0–149.0)
VLDL: 75.2 mg/dL — ABNORMAL HIGH (ref 0.0–40.0)

## 2015-12-15 LAB — COMPREHENSIVE METABOLIC PANEL
ALBUMIN: 3.7 g/dL (ref 3.5–5.2)
ALK PHOS: 48 U/L (ref 39–117)
ALT: 37 U/L (ref 0–53)
AST: 19 U/L (ref 0–37)
BILIRUBIN TOTAL: 0.4 mg/dL (ref 0.2–1.2)
BUN: 19 mg/dL (ref 6–23)
CO2: 28 mEq/L (ref 19–32)
Calcium: 10.1 mg/dL (ref 8.4–10.5)
Chloride: 103 mEq/L (ref 96–112)
Creatinine, Ser: 1.22 mg/dL (ref 0.40–1.50)
GFR: 65.49 mL/min (ref >60.00)
GLUCOSE: 121 mg/dL — AB (ref 70–99)
POTASSIUM: 4.3 meq/L (ref 3.5–5.1)
SODIUM: 140 meq/L (ref 135–145)
TOTAL PROTEIN: 6.7 g/dL (ref 6.0–8.3)

## 2015-12-15 LAB — CBC
HEMATOCRIT: 46 % (ref 39.0–52.0)
HEMOGLOBIN: 15.5 g/dL (ref 13.0–17.0)
MCHC: 33.6 g/dL (ref 30.0–36.0)
MCV: 89.5 fl (ref 78.0–100.0)
Platelets: 293 10*3/uL (ref 150.0–400.0)
RBC: 5.14 Mil/uL (ref 4.22–5.81)
RDW: 13.6 % (ref 11.5–15.5)
WBC: 7.6 10*3/uL (ref 4.0–10.5)

## 2015-12-15 LAB — PSA: PSA: 1 ng/mL (ref 0.10–4.00)

## 2015-12-15 LAB — TSH: TSH: 2.12 u[IU]/mL (ref 0.35–4.50)

## 2015-12-15 LAB — LDL CHOLESTEROL, DIRECT: Direct LDL: 93 mg/dL

## 2015-12-15 NOTE — Assessment & Plan Note (Signed)
1) Anticipatory Guidance: Discussed importance of wearing a seatbelt while driving and not texting while driving; changing batteries in smoke detector at least once annually; wearing suntan lotion when outside; eating a balanced and moderate diet; getting physical activity at least 30 minutes per day.  2) Immunizations / Screenings / Labs:  Declines flu shot. All other immunizations are up-to-date per recommendations. Obtain hepatitis C screening - hep C antibody order placed. Due for a vision exam which will be scheduled independently. All other screenings are up-to-date per recommendations. Obtain CBC, CMET, Lipid profile and TSH.   Overall well exam with risk factors for cardiovascular disease including hypertension which is currently well controlled with medication. BMI of 29 indicating overweight. Discussed importance of eating a varied and balanced nutritional intake that focuses on nutrient dense foods and low in saturated/transfer fats. Continue physical activity at work and encouraged to start walking 30 minutes daily. Follow-up prevention exam in 1 year. Follow-up office visit pending blood work as needed.

## 2015-12-15 NOTE — Assessment & Plan Note (Signed)
Symptoms and exam consistent with finger sprain, however given inflammation there is concern for underlying fracture. Obtain x-rays. Treat conservatively with over-the-counter medications as needed for symptom relief and supportive care. Follow-up and treatment pending x-ray results.

## 2015-12-15 NOTE — Patient Instructions (Signed)
Thank you for choosing [facility].  Summary/Instructions:   Please stop by the lab on the basement level of the building for your blood work. Your results will be released to [place] (or called to you) after review, usually within 72 hours after test completion. If any changes need to be made, you will be notified at that same time.  Please stop by radiology on the basement level of the building for your x-rays. Your results will be released to [facility] (or called to you) after review, usually within 72 hours after test completion. If any treatments or changes are necessary, you will be notified at that same time.  If your symptoms worsen or fail to improve, please contact our office for further instruction, or in case of emergency go directly to the emergency room at the closest medical facility.    Health Maintenance, Male A healthy lifestyle and preventative care can promote health and wellness.  Maintain regular health, dental, and eye exams.  Eat a healthy diet. Foods like vegetables, fruits, whole grains, low-fat dairy products, and lean protein foods contain the nutrients you need and are low in calories. Decrease your intake of foods high in solid fats, added sugars, and salt. Get information about a proper diet from your health care provider, if necessary.  Regular physical exercise is one of the most important things you can do for your health. Most adults should get at least 150 minutes of moderate-intensity exercise (any activity that increases your heart rate and causes you to sweat) each week. In addition, most adults need muscle-strengthening exercises on 2 or more days a week.   Maintain a healthy weight. The body mass index (BMI) is a screening tool to identify possible weight problems. It provides an estimate of body fat based on height and weight. Your health care provider can find your BMI and can help you achieve or maintain a healthy weight. For males 20 years and  older:  A BMI below 18.5 is considered underweight.  A BMI of 18.5 to 24.9 is normal.  A BMI of 25 to 29.9 is considered overweight.  A BMI of 30 and above is considered obese.  Maintain normal blood lipids and cholesterol by exercising and minimizing your intake of saturated fat. Eat a balanced diet with plenty of fruits and vegetables. Blood tests for lipids and cholesterol should begin at age 55 and be repeated every 5 years. If your lipid or cholesterol levels are high, you are over age 58, or you are at high risk for heart disease, you may need your cholesterol levels checked more frequently.Ongoing high lipid and cholesterol levels should be treated with medicines if diet and exercise are not working.  If you smoke, find out from your health care provider how to quit. If you do not use tobacco, do not start.  Lung cancer screening is recommended for adults aged 67-80 years who are at high risk for developing lung cancer because of a history of smoking. A yearly low-dose CT scan of the lungs is recommended for people who have at least a 30-pack-year history of smoking and are current smokers or have quit within the past 15 years. A pack year of smoking is smoking an average of 1 pack of cigarettes a day for 1 year (for example, a 30-pack-year history of smoking could mean smoking 1 pack a day for 30 years or 2 packs a day for 15 years). Yearly screening should continue until the smoker has stopped smoking for  at least 15 years. Yearly screening should be stopped for people who develop a health problem that would prevent them from having lung cancer treatment.  If you choose to drink alcohol, do not have more than 2 drinks per day. One drink is considered to be 12 oz (360 mL) of beer, 5 oz (150 mL) of wine, or 1.5 oz (45 mL) of liquor.  Avoid the use of street drugs. Do not share needles with anyone. Ask for help if you need support or instructions about stopping the use of drugs.  High  blood pressure causes heart disease and increases the risk of stroke. High blood pressure is more likely to develop in:  People who have blood pressure in the end of the normal range (100-139/85-89 mm Hg).  People who are overweight or obese.  People who are African American.  If you are 89-73 years of age, have your blood pressure checked every 3-5 years. If you are 4 years of age or older, have your blood pressure checked every year. You should have your blood pressure measured twice--once when you are at a hospital or clinic, and once when you are not at a hospital or clinic. Record the average of the two measurements. To check your blood pressure when you are not at a hospital or clinic, you can use:  An automated blood pressure machine at a pharmacy.  A home blood pressure monitor.  If you are 32-40 years old, ask your health care provider if you should take aspirin to prevent heart disease.  Diabetes screening involves taking a blood sample to check your fasting blood sugar level. This should be done once every 3 years after age 60 if you are at a normal weight and without risk factors for diabetes. Testing should be considered at a younger age or be carried out more frequently if you are overweight and have at least 1 risk factor for diabetes.  Colorectal cancer can be detected and often prevented. Most routine colorectal cancer screening begins at the age of 50 and continues through age 76. However, your health care provider may recommend screening at an earlier age if you have risk factors for colon cancer. On a yearly basis, your health care provider may provide home test kits to check for hidden blood in the stool. A small camera at the end of a tube may be used to directly examine the colon (sigmoidoscopy or colonoscopy) to detect the earliest forms of colorectal cancer. Talk to your health care provider about this at age 56 when routine screening begins. A direct exam of the colon  should be repeated every 5-10 years through age 29, unless early forms of precancerous polyps or small growths are found.  People who are at an increased risk for hepatitis B should be screened for this virus. You are considered at high risk for hepatitis B if:  You were born in a country where hepatitis B occurs often. Talk with your health care provider about which countries are considered high risk.  Your parents were born in a high-risk country and you have not received a shot to protect against hepatitis B (hepatitis B vaccine).  You have HIV or AIDS.  You use needles to inject street drugs.  You live with, or have sex with, someone who has hepatitis B.  You are a man who has sex with other men (MSM).  You get hemodialysis treatment.  You take certain medicines for conditions like cancer, organ transplantation, and autoimmune  conditions.  Hepatitis C blood testing is recommended for all people born from [birth year] through 1966 and any individual with known risk factors for hepatitis C.  Healthy men should no longer receive prostate-specific antigen (PSA) blood tests as part of routine cancer screening. Talk to your health care provider about prostate cancer screening.  Testicular cancer screening is not recommended for adolescents or adult males who have no symptoms. Screening includes self-exam, a health care provider exam, and other screening tests. Consult with your health care provider about any symptoms you have or any concerns you have about testicular cancer.  Practice safe sex. Use condoms and avoid high-risk sexual practices to reduce the spread of sexually transmitted infections (STIs).  You should be screened for STIs, including gonorrhea and chlamydia if:  You are sexually active and are younger than 24 years.  You are older than 24 years, and your health care provider tells you that you are at risk for this type of infection.  Your sexual activity has changed since you  were last screened, and you are at an increased risk for chlamydia or gonorrhea. Ask your health care provider if you are at risk.  If you are at risk of being infected with HIV, it is recommended that you take a prescription medicine daily to prevent HIV infection. This is called pre-exposure prophylaxis (PrEP). You are considered at risk if:  You are a man who has sex with other men (MSM).  You are a heterosexual man who is sexually active with multiple partners.  You take drugs by injection.  You are sexually active with a partner who has HIV.  Talk with your health care provider about whether you are at high risk of being infected with HIV. If you choose to begin PrEP, you should first be tested for HIV. You should then be tested every 3 months for as long as you are taking PrEP.  Use sunscreen. Apply sunscreen liberally and repeatedly throughout the day. You should seek shade when your shadow is shorter than you. Protect yourself by wearing long sleeves, pants, a wide-brimmed hat, and sunglasses year round whenever you are outdoors.  Tell your health care provider of new moles or changes in moles, especially if there is a change in shape or color. Also, tell your health care provider if a mole is larger than the size of a pencil eraser.  A one-time screening for abdominal aortic aneurysm (AAA) and surgical repair of large AAAs by ultrasound is recommended for men aged 35-75 years who are current or former smokers.  Stay current with your vaccines (immunizations).   This information is not intended to replace advice given to you by your health care provider. Make sure you discuss any questions you have with your health care provider.   Document Released: 06/10/2008 Document Revised: 01/03/2015 Document Reviewed: 05/10/2011 Elsevier Interactive Patient Education [employer].

## 2015-12-15 NOTE — Progress Notes (Signed)
Subjective:    Patient ID: [name], male    DOB: 09-Nov-1961, 54 y.o.   MRN: [id number]  Chief Complaint  Patient presents with  . Annual Exam    HPI:  [name] is a 53 y.o. male who presents today for an annual wellness visit.   1) Health Maintenance -   Diet - Averages about 3-4 meals per day consisting of fruits, vegetables, beef, chicken, pork and fish. About 4-5 cups of caffeine on a daily basis.   Exercise - Daily at work; Does walk when he can. No outside structured exercise.    2) Preventative Exams / Immunizations:  Dental -- Up to date  Vision -- Due for exam    Health Maintenance  Topic Date Due  . Hepatitis C Screening  11/23/1961  . HIV Screening  09/18/1975  . INFLUENZA VACCINE  09/10/2016 (Originally 07/28/2015)  . COLONOSCOPY  09/14/2024  . TETANUS/TDAP  12/14/2024     There is no immunization history on file for this patient.  Allergies  Allergen Reactions  . Codeine Nausea Only     Outpatient Prescriptions Prior to Visit  Medication Sig Dispense Refill  . albuterol (PROVENTIL HFA;VENTOLIN HFA) 108 (90 BASE) MCG/ACT inhaler Inhale 2 puffs into the lungs every 4 (four) hours as needed for wheezing.    [name] amLODipine (NORVASC) 10 MG tablet Take 1 tablet (10 mg total) by mouth daily. 90 tablet 0  . amphetamine-dextroamphetamine (ADDERALL) 30 MG tablet Take 30 mg by mouth daily as needed.    . calcium carbonate (TUMS - DOSED IN MG ELEMENTAL CALCIUM) 500 MG chewable tablet Chew 1-2 tablets by mouth as needed for heartburn.    . escitalopram (LEXAPRO) 10 MG tablet Take 10 mg by mouth daily.    . fluticasone (FLONASE) 50 MCG/ACT nasal spray Place 2 sprays into the nose daily.    . hydrocortisone (ANUSOL-HC) 2.5 % rectal cream Place 1 application rectally 2 (two) times daily. 30 g 0  . lisinopril (PRINIVIL,ZESTRIL) 20 MG tablet Take 1 tablet (20 mg total) by mouth daily. 90 tablet 0  . Misc Natural Products (OSTEO BI-FLEX JOINT SHIELD) TABS  Take 1 tablet by mouth daily.    . pantoprazole (PROTONIX) 40 MG tablet Take 40 mg by mouth daily.     No facility-administered medications prior to visit.     Past Medical History  Diagnosis Date  . Hypertension   . GERD (gastroesophageal reflux disease)   . Rectal fissure   . Seasonal allergies   . Pleurisy     hx-12/13  . Skin cancer   . Asthma     bronchitis  . Allergy   . Colon polyps      Past Surgical History  Procedure Laterality Date  . Knee surgery  2005    lt acl  . Shoulder surgery  2010    left  . Hemorrhoid surgery  2005  . Appendectomy  2006    lap append  . Knee arthroscopy  2009    left  . Cholecystectomy    . Nasal fracture surgery  2012    closed reduction   . Knee arthroscopy Left 06/12/2013    Procedure: LEFT KNEE ARTHROSCOPY PARTIAL MEDIAL MENISECTOMY WITH CHONDROPLASTY;  Surgeon: Hessie Dibble, MD;  Location: [place];  Service: Orthopedics;  Laterality: Left;     Family History  Problem Relation Age of Onset  . Heart attack Mother   . Skin cancer Mother   .  Hypertension Mother   . Hyperlipidemia Mother   . Stroke Mother   . Heart attack Father   . Hyperlipidemia Brother   . Bipolar disorder Maternal Grandmother   . Arthritis Maternal Grandmother   . Hyperlipidemia Maternal Grandmother   . Hypertension Maternal Grandmother   . Arthritis Maternal Grandfather   . Hyperlipidemia Maternal Grandfather   . Stroke Maternal Grandfather   . Hypertension Maternal Grandfather   . Arthritis Paternal Grandmother   . Hypertension Paternal Grandmother   . Arthritis Paternal Grandfather   . Hypertension Paternal Grandfather      Social History   Social History  . Marital Status: Married    Spouse Name: N/A  . Number of Children: 5  . Years of Education: 13   Occupational History  . landscaping    Social History Main Topics  . Smoking status: Former Smoker -- 0.25 packs/day for 4 years    Types: Cigarettes, Cigars     Quit date: 12/27/1984  . Smokeless tobacco: Never Used     Comment: occasional  . Alcohol Use: Yes     Comment: occ  . Drug Use: 2.00 per week    Special: Marijuana  . Sexual Activity: Not on file     Comment: occ cigars-quit cigs   Other Topics Concern  . Not on file   Social History Narrative   Former Company secretary     Review of Systems  Constitutional: Denies fever, chills, fatigue, or significant weight gain/loss. HENT: Head: Denies headache or neck pain Ears: Denies changes in hearing, ringing in ears, earache, drainage Nose: Denies discharge, stuffiness, itching, nosebleed, sinus pain Throat: Denies sore throat, hoarseness, dry mouth, sores, thrush Eyes: Denies loss/changes in vision, pain, redness, blurry/double vision, flashing lights Cardiovascular: Denies chest pain/discomfort, tightness, palpitations, shortness of breath with activity, difficulty lying down, swelling, sudden awakening with shortness of breath Respiratory: Denies shortness of breath, cough, sputum production, wheezing Gastrointestinal: Denies dysphasia, heartburn, change in appetite, nausea, change in bowel habits, rectal bleeding, constipation, diarrhea, yellow skin or eyes Genitourinary: Denies frequency, urgency, burning/pain, blood in urine, incontinence, change in urinary strength. Musculoskeletal: Denies muscle/joint pain, stiffness, back pain, redness or swelling of joints, trauma Skin: Denies rashes, lumps, itching, dryness, color changes, or hair/nail changes Neurological: Denies dizziness, fainting, seizures, weakness, numbness, tingling, tremor Psychiatric - Denies nervousness, stress, depression or memory loss Endocrine: Denies heat or cold intolerance, sweating, frequent urination, excessive thirst, changes in appetite Hematologic: Denies ease of bruising or bleeding     Objective:    BP 120/88 mmHg  Pulse 101  Temp(Src) 98.1 F (36.7 C) (Oral)  Resp 18  Ht 5\' 9"  (1.753 m)  Wt 203 lb  (92.08 kg)  BMI 29.96 kg/m2  SpO2 97% Nursing note and vital signs reviewed.  Physical Exam  Constitutional: He is oriented to person, place, and time. He appears well-developed and well-nourished.  HENT:  Head: Normocephalic.  Right Ear: Hearing, tympanic membrane, external ear and ear canal normal.  Left Ear: Hearing, tympanic membrane, external ear and ear canal normal.  Nose: Nose normal.  Mouth/Throat: Uvula is midline, oropharynx is clear and moist and mucous membranes are normal.  Eyes: Conjunctivae and EOM are normal. Pupils are equal, round, and reactive to light.  Neck: Neck supple. No JVD present. No tracheal deviation present. No thyromegaly present.  Cardiovascular: Normal rate, regular rhythm, normal heart sounds and intact distal pulses.   Pulmonary/Chest: Effort normal and breath sounds normal.  Abdominal: Soft. Bowel sounds are normal.  He exhibits no distension and no mass. There is no tenderness. There is no rebound and no guarding.  Musculoskeletal: Normal range of motion. He exhibits no edema or tenderness.  Right middle finger - mild edema without deformity or discoloration. Tenderness of the right PIP joint. ROM is slightly limited in flexion. Capillary refill is intact and appropriate.   Lymphadenopathy:    He has no cervical adenopathy.  Neurological: He is alert and oriented to person, place, and time. He has normal reflexes. No cranial nerve deficit. He exhibits normal muscle tone. Coordination normal.  Skin: Skin is warm and dry.  Psychiatric: He has a normal mood and affect. His behavior is normal. Judgment and thought content normal.       Assessment & Plan:   Problem List Items Addressed This Visit      Other   Routine general medical examination at a health care facility - Primary    1) Anticipatory Guidance: Discussed importance of wearing a seatbelt while driving and not texting while driving; changing batteries in smoke detector at least once  annually; wearing suntan lotion when outside; eating a balanced and moderate diet; getting physical activity at least 30 minutes per day.  2) Immunizations / Screenings / Labs:  Declines flu shot. All other immunizations are up-to-date per recommendations. Obtain hepatitis C screening - hep C antibody order placed. Due for a vision exam which will be scheduled independently. All other screenings are up-to-date per recommendations. Obtain CBC, CMET, Lipid profile and TSH.   Overall well exam with risk factors for cardiovascular disease including hypertension which is currently well controlled with medication. BMI of 29 indicating overweight. Discussed importance of eating a varied and balanced nutritional intake that focuses on nutrient dense foods and low in saturated/transfer fats. Continue physical activity at work and encouraged to start walking 30 minutes daily. Follow-up prevention exam in 1 year. Follow-up office visit pending blood work as needed.       Relevant Orders   CBC   Comprehensive metabolic panel   TSH   Lipid panel   PSA   Hepatitis C antibody   Pain of right middle finger    Symptoms and exam consistent with finger sprain, however given inflammation there is concern for underlying fracture. Obtain x-rays. Treat conservatively with over-the-counter medications as needed for symptom relief and supportive care. Follow-up and treatment pending x-ray results.      Relevant Orders   DG Hand Complete Right

## 2015-12-15 NOTE — Progress Notes (Deleted)
   Subjective:    Patient ID: [name], male    DOB: 01/12/1961, 54 y.o.   MRN: [id number]  No chief complaint on file.   HPI:  [name] is a 54 y.o. male who  has a past medical history of Hypertension; GERD (gastroesophageal reflux disease); Rectal fissure; Seasonal allergies; Pleurisy; Skin cancer; Asthma; Allergy; and Colon polyps. and presents today ***   Review of Systems    Objective:    BP 120/88 mmHg  Pulse 101  Temp(Src) 98.1 F (36.7 C) (Oral)  Resp 18  Ht 5\' 9"  (1.753 m)  Wt 203 lb (92.08 kg)  BMI 29.96 kg/m2  SpO2 97% Nursing note and vital signs reviewed.  Physical Exam     Assessment & Plan:   Problem List Items Addressed This Visit    None

## 2015-12-15 NOTE — Progress Notes (Signed)
Pre visit review using our clinic review tool, if applicable. No additional management support is needed unless otherwise documented below in the visit note. 

## 2015-12-16 ENCOUNTER — Telehealth: Payer: Self-pay | Admitting: Family

## 2015-12-16 LAB — HEPATITIS C ANTIBODY: HCV Ab: NEGATIVE

## 2015-12-16 NOTE — Telephone Encounter (Signed)
Please inform patient that his x-rays are negative for fracture or dislocation. Therefore please continue with current treatment and his finger should gradually improve.

## 2015-12-17 NOTE — Telephone Encounter (Signed)
Please inform patient that his blood work shows that his Hepatitis C is negative. His thyroid function, prostate, white/red blood cells, liver function and electrolytes are within the normal limits. His overall cholesterol looks good, however his triglycerides, or fat circulating in his blood is elevated at 376 with a goal of less than 150. I recommend nutrition changes include the reduction of processed and saturated fats and also increasing cardiovascular physical activity. I would like him to follow up in 6 months to recheck.

## 2015-12-18 NOTE — Telephone Encounter (Signed)
Pt aware of results 

## 2016-04-01 DIAGNOSIS — F4310 Post-traumatic stress disorder, unspecified: Secondary | ICD-10-CM | POA: Diagnosis not present

## 2016-04-06 DIAGNOSIS — F4323 Adjustment disorder with mixed anxiety and depressed mood: Secondary | ICD-10-CM | POA: Diagnosis not present

## 2016-04-08 DIAGNOSIS — F4310 Post-traumatic stress disorder, unspecified: Secondary | ICD-10-CM | POA: Diagnosis not present

## 2016-04-13 ENCOUNTER — Other Ambulatory Visit: Payer: Self-pay | Admitting: *Deleted

## 2016-04-13 DIAGNOSIS — F4310 Post-traumatic stress disorder, unspecified: Secondary | ICD-10-CM | POA: Diagnosis not present

## 2016-04-13 DIAGNOSIS — I10 Essential (primary) hypertension: Secondary | ICD-10-CM

## 2016-04-13 MED ORDER — LISINOPRIL 20 MG PO TABS: 20.0000 mg | ORAL_TABLET | Freq: Every day | ORAL | Status: DC

## 2016-04-13 MED ORDER — AMLODIPINE BESYLATE 10 MG PO TABS: 10.0000 mg | ORAL_TABLET | Freq: Every day | ORAL | Status: DC

## 2016-04-13 NOTE — Telephone Encounter (Signed)
Receive call pt states he need refills sent to [facility] on his Lisinopril & amlodipine. Inform pt sending electronically...Johny Chess

## 2016-04-15 DIAGNOSIS — F4323 Adjustment disorder with mixed anxiety and depressed mood: Secondary | ICD-10-CM | POA: Diagnosis not present

## 2016-04-27 DIAGNOSIS — F4323 Adjustment disorder with mixed anxiety and depressed mood: Secondary | ICD-10-CM | POA: Diagnosis not present

## 2016-05-04 DIAGNOSIS — F4310 Post-traumatic stress disorder, unspecified: Secondary | ICD-10-CM | POA: Diagnosis not present

## 2016-05-11 ENCOUNTER — Ambulatory Visit: Payer: BLUE CROSS/BLUE SHIELD | Admitting: Emergency Medicine

## 2016-05-11 DIAGNOSIS — F4310 Post-traumatic stress disorder, unspecified: Secondary | ICD-10-CM | POA: Diagnosis not present

## 2016-05-13 ENCOUNTER — Encounter: Payer: Self-pay | Admitting: Internal Medicine

## 2016-05-13 ENCOUNTER — Ambulatory Visit (INDEPENDENT_AMBULATORY_CARE_PROVIDER_SITE_OTHER): Payer: BLUE CROSS/BLUE SHIELD | Admitting: Internal Medicine

## 2016-05-13 VITALS — BP 110/70 | HR 85 | Temp 98.6°F | Wt 197.0 lb

## 2016-05-13 DIAGNOSIS — N611 Abscess of the breast and nipple: Secondary | ICD-10-CM | POA: Insufficient documentation

## 2016-05-13 MED ORDER — CEFTRIAXONE SODIUM 1 G IJ SOLR
1.0000 g | Freq: Once | INTRAMUSCULAR | Status: AC
  Administered 2016-05-13: 1 g via INTRAMUSCULAR

## 2016-05-13 MED ORDER — MUPIROCIN 2 % EX OINT: TOPICAL_OINTMENT | CUTANEOUS | Status: DC

## 2016-05-13 MED ORDER — DOXYCYCLINE HYCLATE 100 MG PO TABS: 100.0000 mg | ORAL_TABLET | Freq: Two times a day (BID) | ORAL | Status: DC

## 2016-05-13 MED ORDER — HYDROCODONE-ACETAMINOPHEN 7.5-325 MG PO TABS: 1.0000 | ORAL_TABLET | Freq: Four times a day (QID) | ORAL | Status: DC | PRN

## 2016-05-13 MED ORDER — KETOROLAC TROMETHAMINE 30 MG/ML IJ SOLN
30.0000 mg | Freq: Once | INTRAMUSCULAR | Status: AC
  Administered 2016-05-13: 30 mg via INTRAMUSCULAR

## 2016-05-13 NOTE — Progress Notes (Signed)
Subjective:  Patient ID: [name], male    DOB: Apr 27, 1961  Age: 55 y.o. MRN: [id number]  CC: No chief complaint on file.   HPI [name] presents for a very painful L nipple swelling and redness x 5-6 d. The pt poked it w/a needle; he did not sleep last night due to pain. ???bug bite  Outpatient Prescriptions Prior to Visit  Medication Sig Dispense Refill  . albuterol (PROVENTIL HFA;VENTOLIN HFA) 108 (90 BASE) MCG/ACT inhaler Inhale 2 puffs into the lungs every 4 (four) hours as needed for wheezing.    [name] amLODipine (NORVASC) 10 MG tablet Take 1 tablet (10 mg total) by mouth daily. 90 tablet 2  . amphetamine-dextroamphetamine (ADDERALL) 30 MG tablet Take 30 mg by mouth daily as needed.    . calcium carbonate (TUMS - DOSED IN MG ELEMENTAL CALCIUM) 500 MG chewable tablet Chew 1-2 tablets by mouth as needed for heartburn.    . escitalopram (LEXAPRO) 10 MG tablet Take 10 mg by mouth daily.    . fluticasone (FLONASE) 50 MCG/ACT nasal spray Place 2 sprays into the nose daily.    . hydrocortisone (ANUSOL-HC) 2.5 % rectal cream Place 1 application rectally 2 (two) times daily. 30 g 0  . lisinopril (PRINIVIL,ZESTRIL) 20 MG tablet Take 1 tablet (20 mg total) by mouth daily. 90 tablet 2  . pantoprazole (PROTONIX) 40 MG tablet Take 40 mg by mouth daily.    . Misc Natural Products (OSTEO BI-FLEX JOINT SHIELD) TABS Take 1 tablet by mouth daily. Reported on 05/13/2016     No facility-administered medications prior to visit.    ROS Review of Systems  Constitutional: Positive for chills. Negative for fever.  Respiratory: Negative for shortness of breath.   Cardiovascular: Negative for chest pain.  Gastrointestinal: Negative for abdominal pain.    Objective:  BP 110/70 mmHg  Pulse 85  Temp(Src) 98.6 F (37 C) (Oral)  Wt 197 lb (89.359 kg)  SpO2 96%  BP Readings from Last 3 Encounters:  05/13/16 110/70  12/15/15 120/88  12/08/15 136/86    Wt Readings from Last 3 Encounters:    05/13/16 197 lb (89.359 kg)  12/15/15 203 lb (92.08 kg)  12/08/15 202 lb 12.8 oz (91.989 kg)    Physical Exam  Constitutional: No distress.  HENT:  Mouth/Throat: Oropharynx is clear and moist.  Pulmonary/Chest: He has no wheezes.  Musculoskeletal: He exhibits no edema.  Skin: No rash noted. There is erythema. No pallor.  Psychiatric: His behavior is normal. Judgment normal.  4x2 cm oval eryth tender firm area under L nipple and over the areola with a 2 mm white head   POC [place] confirmed a sq abscess presence   Procedure note:  Incision and Drainage of an Abscess   Indication : a localized collection of pus that is tender and not spontaneously resolving.    Risks including unsuccessful procedure , possible need for a repeat procedure due to pus accumulation, scar formation, and others as well as benefits were explained to the patient in detail. Written consent was obtained/signed.    The patient was placed in a decubitus position. The area of an abscess was prepped with povidone-iodine and draped in a sterile fashion. Local anesthesia with 6 cc of 2% lidocaine and epinephrine  was administered under the areola guided from the unaffected skin due to extreme pain.  12 mm incision with #11strait blade was made. About 1 cc of purulent material was expressed. The abscess cavity was  explored with a sterile hemostat and the walled- off pockets and septae were broken down bluntly, necrotic fat was removed. The cavity was irrigated with the rest of the anesthetic in the syringe and packed with 1 inch of  the iodoform gauze packing.   The wound was dressed with Bactroban antibiotic ointment and Telfa pad.  Tolerated well. Complications: None.   Wound instructions provided.  FTF>45 min  Lab Results  Component Value Date   WBC 7.6 12/15/2015   HGB 15.5 12/15/2015   HCT 46.0 12/15/2015   PLT 293.0 12/15/2015   GLUCOSE 121* 12/15/2015   CHOL 185 12/15/2015   TRIG 376.0* 12/15/2015   HDL  45.10 12/15/2015   LDLDIRECT 93.0 12/15/2015   ALT 37 12/15/2015   AST 19 12/15/2015   NA 140 12/15/2015   K 4.3 12/15/2015   CL 103 12/15/2015   CREATININE 1.22 12/15/2015   BUN 19 12/15/2015   CO2 28 12/15/2015   TSH 2.12 12/15/2015   PSA 1.00 12/15/2015    Dg Hand Complete Right  12/15/2015  CLINICAL DATA:  Trauma 2 months ago with swelling and pain at the proximal interphalangeal joint of the middle finger. EXAM: RIGHT HAND - COMPLETE 3+ VIEW COMPARISON:  None. FINDINGS: Soft tissue swelling is identified about the proximal interphalangeal joint of the middle finger. No acute fracture or dislocation. No periosteal reaction or callus deposition. IMPRESSION: Soft tissue swelling, without acute osseous abnormality. Electronically Signed   By: Abigail Miyamoto M.D.   On: 12/15/2015 15:08    Assessment & Plan:   There are no diagnoses linked to this encounter. I am having Mr. Lysaght maintain his pantoprazole, escitalopram, OSTEO BI-FLEX JOINT SHIELD, calcium carbonate, fluticasone, albuterol, amphetamine-dextroamphetamine, hydrocortisone, amLODipine, and lisinopril.  No orders of the defined types were placed in this encounter.     Follow-up: No Follow-up on file.  Walker Kehr, MD

## 2016-05-13 NOTE — Patient Instructions (Signed)

## 2016-05-13 NOTE — Assessment & Plan Note (Addendum)
Left w/extreme pain POC US - done by myself I&D now Doxy x 7-10d Bactroban Norco prn  Given Rocephin 1 g and Toradol 60 mg IM

## 2016-05-13 NOTE — Progress Notes (Signed)
Pre visit review using our clinic review tool, if applicable. No additional management support is needed unless otherwise documented below in the visit note. 

## 2016-05-18 DIAGNOSIS — F411 Generalized anxiety disorder: Secondary | ICD-10-CM | POA: Diagnosis not present

## 2016-05-18 DIAGNOSIS — F902 Attention-deficit hyperactivity disorder, combined type: Secondary | ICD-10-CM | POA: Diagnosis not present

## 2016-05-18 DIAGNOSIS — I10 Essential (primary) hypertension: Secondary | ICD-10-CM | POA: Diagnosis not present

## 2016-05-18 DIAGNOSIS — Z79899 Other long term (current) drug therapy: Secondary | ICD-10-CM | POA: Diagnosis not present

## 2016-06-21 DIAGNOSIS — M1712 Unilateral primary osteoarthritis, left knee: Secondary | ICD-10-CM | POA: Diagnosis not present

## 2016-06-21 DIAGNOSIS — M1711 Unilateral primary osteoarthritis, right knee: Secondary | ICD-10-CM | POA: Diagnosis not present

## 2016-06-21 DIAGNOSIS — M25532 Pain in left wrist: Secondary | ICD-10-CM | POA: Diagnosis not present

## 2016-06-22 ENCOUNTER — Ambulatory Visit: Payer: BLUE CROSS/BLUE SHIELD | Admitting: Emergency Medicine

## 2016-07-05 DIAGNOSIS — M25562 Pain in left knee: Secondary | ICD-10-CM | POA: Diagnosis not present

## 2016-07-15 DIAGNOSIS — S8332XA Tear of articular cartilage of left knee, current, initial encounter: Secondary | ICD-10-CM | POA: Diagnosis not present

## 2016-07-15 DIAGNOSIS — S83272A Complex tear of lateral meniscus, current injury, left knee, initial encounter: Secondary | ICD-10-CM | POA: Diagnosis not present

## 2016-07-15 DIAGNOSIS — G8918 Other acute postprocedural pain: Secondary | ICD-10-CM | POA: Diagnosis not present

## 2016-07-15 DIAGNOSIS — M2242 Chondromalacia patellae, left knee: Secondary | ICD-10-CM | POA: Diagnosis not present

## 2016-07-23 DIAGNOSIS — S83272D Complex tear of lateral meniscus, current injury, left knee, subsequent encounter: Secondary | ICD-10-CM | POA: Diagnosis not present

## 2016-07-26 DIAGNOSIS — S83272D Complex tear of lateral meniscus, current injury, left knee, subsequent encounter: Secondary | ICD-10-CM | POA: Diagnosis not present

## 2016-07-27 DIAGNOSIS — M25562 Pain in left knee: Secondary | ICD-10-CM | POA: Diagnosis not present

## 2016-07-27 DIAGNOSIS — S83272D Complex tear of lateral meniscus, current injury, left knee, subsequent encounter: Secondary | ICD-10-CM | POA: Diagnosis not present

## 2016-07-27 DIAGNOSIS — Z9889 Other specified postprocedural states: Secondary | ICD-10-CM | POA: Diagnosis not present

## 2016-07-28 DIAGNOSIS — S83272D Complex tear of lateral meniscus, current injury, left knee, subsequent encounter: Secondary | ICD-10-CM | POA: Diagnosis not present

## 2016-07-28 DIAGNOSIS — Z9889 Other specified postprocedural states: Secondary | ICD-10-CM | POA: Diagnosis not present

## 2016-07-28 DIAGNOSIS — M25562 Pain in left knee: Secondary | ICD-10-CM | POA: Diagnosis not present

## 2016-07-29 ENCOUNTER — Emergency Department (HOSPITAL_COMMUNITY): Payer: BLUE CROSS/BLUE SHIELD

## 2016-07-29 ENCOUNTER — Inpatient Hospital Stay (HOSPITAL_COMMUNITY)
Admission: EM | Admit: 2016-07-29 | Discharge: 2016-08-02 | DRG: 550 | Disposition: A | Discharge status: Home Health | Payer: BLUE CROSS/BLUE SHIELD | Attending: Orthopaedic Surgery | Admitting: Orthopaedic Surgery

## 2016-07-29 ENCOUNTER — Encounter (HOSPITAL_COMMUNITY): Payer: Self-pay | Admitting: *Deleted

## 2016-07-29 DIAGNOSIS — Z87891 Personal history of nicotine dependence: Secondary | ICD-10-CM

## 2016-07-29 DIAGNOSIS — T814XXA Infection following a procedure, initial encounter: Secondary | ICD-10-CM | POA: Diagnosis not present

## 2016-07-29 DIAGNOSIS — M00862 Arthritis due to other bacteria, left knee: Secondary | ICD-10-CM | POA: Diagnosis not present

## 2016-07-29 DIAGNOSIS — K219 Gastro-esophageal reflux disease without esophagitis: Secondary | ICD-10-CM | POA: Diagnosis not present

## 2016-07-29 DIAGNOSIS — B9689 Other specified bacterial agents as the cause of diseases classified elsewhere: Secondary | ICD-10-CM | POA: Diagnosis not present

## 2016-07-29 DIAGNOSIS — Z113 Encounter for screening for infections with a predominantly sexual mode of transmission: Secondary | ICD-10-CM

## 2016-07-29 DIAGNOSIS — M25462 Effusion, left knee: Secondary | ICD-10-CM | POA: Diagnosis not present

## 2016-07-29 DIAGNOSIS — I10 Essential (primary) hypertension: Secondary | ICD-10-CM | POA: Diagnosis not present

## 2016-07-29 DIAGNOSIS — M25562 Pain in left knee: Secondary | ICD-10-CM | POA: Diagnosis present

## 2016-07-29 DIAGNOSIS — M009 Pyogenic arthritis, unspecified: Principal | ICD-10-CM | POA: Diagnosis present

## 2016-07-29 DIAGNOSIS — Z85828 Personal history of other malignant neoplasm of skin: Secondary | ICD-10-CM | POA: Diagnosis not present

## 2016-07-29 DIAGNOSIS — J45909 Unspecified asthma, uncomplicated: Secondary | ICD-10-CM | POA: Diagnosis not present

## 2016-07-29 DIAGNOSIS — A4902 Methicillin resistant Staphylococcus aureus infection, unspecified site: Secondary | ICD-10-CM | POA: Diagnosis not present

## 2016-07-29 LAB — BASIC METABOLIC PANEL WITH GFR
Anion gap: 12 (ref 5–15)
BUN: 15 mg/dL (ref 6–20)
CO2: 23 mmol/L (ref 22–32)
Calcium: 9.1 mg/dL (ref 8.9–10.3)
Chloride: 96 mmol/L — ABNORMAL LOW (ref 101–111)
Creatinine, Ser: 1.02 mg/dL (ref 0.61–1.24)
GFR calc Af Amer: >60 mL/min
GFR calc non Af Amer: >60 mL/min
Glucose, Bld: 138 mg/dL — ABNORMAL HIGH (ref 65–99)
Potassium: 4 mmol/L (ref 3.5–5.1)
Sodium: 131 mmol/L — ABNORMAL LOW (ref 135–145)

## 2016-07-29 LAB — CBC
HCT: 45.1 % (ref 39.0–52.0)
Hemoglobin: 15.5 g/dL (ref 13.0–17.0)
MCH: 31.4 pg (ref 26.0–34.0)
MCHC: 34.4 g/dL (ref 30.0–36.0)
MCV: 91.3 fL (ref 78.0–100.0)
Platelets: 307 K/uL (ref 150–400)
RBC: 4.94 MIL/uL (ref 4.22–5.81)
RDW: 13.4 % (ref 11.5–15.5)
WBC: 21.1 K/uL — ABNORMAL HIGH (ref 4.0–10.5)

## 2016-07-29 MED ORDER — SODIUM CHLORIDE 0.9 % IV SOLN
1000.0000 mL | Freq: Once | INTRAVENOUS | Status: AC
  Administered 2016-07-30: 1000 mL via INTRAVENOUS

## 2016-07-29 MED ORDER — SODIUM CHLORIDE 0.9 % IV SOLN
1000.0000 mL | INTRAVENOUS | Status: DC
Start: 2016-07-30 — End: 2016-08-02
  Administered 2016-07-30 – 2016-07-31 (×2): 1000 mL via INTRAVENOUS

## 2016-07-29 MED ORDER — HYDROMORPHONE HCL 1 MG/ML IJ SOLN
1.0000 mg | INTRAMUSCULAR | Status: AC | PRN
Start: 2016-07-29 — End: 2016-07-30
  Administered 2016-07-30 (×3): 1 mg via INTRAVENOUS
  Filled 2016-07-29 (×3): qty 1

## 2016-07-29 MED ORDER — LIDOCAINE-EPINEPHRINE 1 %-1:100000 IJ SOLN
30.0000 mL | Freq: Once | INTRAMUSCULAR | Status: AC
  Administered 2016-07-30: 30 mL
  Filled 2016-07-29: qty 1

## 2016-07-29 NOTE — ED Notes (Signed)
Pt reports 2 episodes of vomiting in waiting area.

## 2016-07-29 NOTE — ED Provider Notes (Signed)
[facility] DEPT Provider Note   CSN: [id number] Arrival date & time: 07/29/16  [id number]  First Provider Contact:  First MD Initiated Contact with Patient 07/29/16 2342    By signing my name below, I, Dyke Brackett, attest that this documentation has been prepared under the direction and in the presence of Jola Schmidt, MD . Electronically Signed: Dyke Brackett, Scribe. 07/29/2016. 11:57 PM.   History   Chief Complaint Chief Complaint  Patient presents with  . Knee Pain    HPI [name] is a 55 y.o. male who presents to the Emergency Department complaining of worsening, constant, moderate left knee pain onset two weeks ago. Pt states he had left knee arthroscopy done by Dr. Rhona Raider 07/15/16. Pt states surgeon did cut tissue off. Per pt, he has had fluid build up in left knee which has been drained twice, latest being yesterday. Pt notes associated swelling, draining, nausea, and vomiting 2x. Per pt, drainage is thick and bloody. Pt was prescribed Keflex yesterday and has taken 6 pills with no relief. He was also given a shot of Toradol with no relief. Pt denies any fever.   The history is provided by the patient. No language interpreter was used.    Past Medical History:  Diagnosis Date  . Allergy   . Asthma    bronchitis  . Colon polyps   . GERD (gastroesophageal reflux disease)   . Hypertension   . Pleurisy    hx-12/13  . Rectal fissure   . Seasonal allergies   . Skin cancer     Patient Active Problem List   Diagnosis Date Noted  . Abscess of nipple 05/13/2016  . Routine general medical examination at a health care facility 12/15/2015  . Pain of right middle finger 12/15/2015  . Encounter for general adult medical examination with abnormal findings 12/15/2015  . Essential hypertension 12/08/2015  . Hemorrhoids 12/08/2015  . ADD (attention deficit disorder) 12/08/2015  . Pulmonary nodule 05/17/2014  . Pleural scarring 05/11/2013  . Pleurisy 05/11/2013  . Cough  05/11/2013  . Allergic rhinitis 05/11/2013  . Mild intermittent asthma 05/11/2013  . GERD (gastroesophageal reflux disease) 05/11/2013    Past Surgical History:  Procedure Laterality Date  . APPENDECTOMY  2006   lap append  . CHOLECYSTECTOMY    . [place]  2005  . KNEE ARTHROSCOPY  2009   left  . KNEE ARTHROSCOPY Left 06/12/2013   Procedure: LEFT KNEE ARTHROSCOPY PARTIAL MEDIAL MENISECTOMY WITH CHONDROPLASTY;  Surgeon: Hessie Dibble, MD;  Location: [facility];  Service: Orthopedics;  Laterality: Left;  . KNEE SURGERY  2005   lt acl  . NASAL FRACTURE SURGERY  2012   closed reduction   . SHOULDER SURGERY  2010   left      Home Medications    Prior to Admission medications   Medication Sig Start Date End Date Taking? Authorizing Provider  albuterol (PROVENTIL HFA;VENTOLIN HFA) 108 (90 BASE) MCG/ACT inhaler Inhale 2 puffs into the lungs every 4 (four) hours as needed for wheezing.    Historical Provider, MD  amLODipine (NORVASC) 10 MG tablet Take 1 tablet (10 mg total) by mouth daily. 04/13/16   Golden Circle, FNP  amphetamine-dextroamphetamine (ADDERALL) 30 MG tablet Take 30 mg by mouth daily as needed.    Historical Provider, MD  calcium carbonate (TUMS - DOSED IN MG ELEMENTAL CALCIUM) 500 MG chewable tablet Chew 1-2 tablets by mouth as needed for heartburn.    Historical Provider,  MD  doxycycline (VIBRA-TABS) 100 MG tablet Take 1 tablet (100 mg total) by mouth 2 (two) times daily. 05/13/16   Evie Lacks Plotnikov, MD  escitalopram (LEXAPRO) 10 MG tablet Take 10 mg by mouth daily.    Historical Provider, MD  fluticasone (FLONASE) 50 MCG/ACT nasal spray Place 2 sprays into the nose daily.    Historical Provider, MD  HYDROcodone-acetaminophen (NORCO) 7.5-325 MG tablet Take 1 tablet by mouth every 6 (six) hours as needed for severe pain. 05/13/16   Evie Lacks Plotnikov, MD  hydrocortisone (ANUSOL-HC) 2.5 % rectal cream Place 1 application rectally 2 (two)  times daily. 12/08/15   Golden Circle, FNP  lisinopril (PRINIVIL,ZESTRIL) 20 MG tablet Take 1 tablet (20 mg total) by mouth daily. 04/13/16   Golden Circle, FNP  Misc Natural Products (OSTEO BI-FLEX JOINT SHIELD) TABS Take 1 tablet by mouth daily. Reported on 05/13/2016    Historical Provider, MD  mupirocin ointment (BACTROBAN) 2 % Use w/dressing change 05/13/16   Cassandria Anger, MD  pantoprazole (PROTONIX) 40 MG tablet Take 40 mg by mouth daily.    Historical Provider, MD    Family History Family History  Problem Relation Age of Onset  . Heart attack Mother   . Skin cancer Mother   . Hypertension Mother   . Hyperlipidemia Mother   . Stroke Mother   . Heart attack Father   . Bipolar disorder Maternal Grandmother   . Arthritis Maternal Grandmother   . Hyperlipidemia Maternal Grandmother   . Hypertension Maternal Grandmother   . Arthritis Maternal Grandfather   . Hyperlipidemia Maternal Grandfather   . Stroke Maternal Grandfather   . Hypertension Maternal Grandfather   . Arthritis Paternal Grandmother   . Hypertension Paternal Grandmother   . Arthritis Paternal Grandfather   . Hypertension Paternal Grandfather   . Hyperlipidemia Brother     Social History Social History  Substance Use Topics  . Smoking status: Former Smoker    Packs/day: 0.25    Years: 4.00    Types: Cigarettes, Cigars    Quit date: 12/27/1984  . Smokeless tobacco: Never Used     Comment: occasional  . Alcohol use Yes     Comment: occ     Allergies   Codeine   Review of Systems Review of Systems  10 Systems reviewed and are negative for acute change except as noted in the HPI.  Physical Exam Updated Vital Signs BP (!) 150/107 (BP Location: Right Arm)   Pulse 119   Temp 98 F (36.7 C) (Oral)   Resp 18   SpO2 95%   Physical Exam  Constitutional: He is oriented to person, place, and time. He appears well-developed and well-nourished.  HENT:  Head: Normocephalic and atraumatic.    Eyes: EOM are normal.  Neck: Normal range of motion.  Cardiovascular: Normal rate, regular rhythm, normal heart sounds and intact distal pulses.   Pulmonary/Chest: Effort normal and breath sounds normal. No respiratory distress.  Abdominal: Soft. He exhibits no distension. There is no tenderness.  Musculoskeletal: Normal range of motion.  Mild erythema with significant joint effusion Significant pain with ROM Normal pulses left foot  Neurological: He is alert and oriented to person, place, and time.  Skin: Skin is warm and dry.  Psychiatric: He has a normal mood and affect. Judgment normal.  Nursing note and vitals reviewed.    ED Treatments / Results  DIAGNOSTIC STUDIES:  Oxygen Saturation is 95% on RA, adequatel by my interpretation.  COORDINATION OF CARE:  11:54 PM Discussed treatment plan which includes Dilaudid with pt at bedside and pt agreed to plan.  Labs (all labs ordered are listed, but only abnormal results are displayed) Labs Reviewed  CBC - Abnormal; Notable for the following:       Result Value   WBC 21.1 (*)    All other components within normal limits  BASIC METABOLIC PANEL - Abnormal; Notable for the following:    Sodium 131 (*)    Chloride 96 (*)    Glucose, Bld 138 (*)    All other components within normal limits  SYNOVIAL CELL COUNT + DIFF, W/ CRYSTALS - Abnormal; Notable for the following:    Appearance-Synovial TURBID (*)    WBC, Synovial 89,250 (*)    Neutrophil, Synovial 95 (*)    Monocyte-Macrophage-Synovial Fluid 5 (*)    All other components within normal limits  BODY FLUID CULTURE  LACTIC ACID, PLASMA  GLUCOSE, SYNOVIAL FLUID  PROTEIN, SYNOVIAL FLUID    EKG  EKG Interpretation None       Radiology Dg Knee Complete 4 Views Left  Result Date: 07/29/2016 CLINICAL DATA:  Left knee surgery 2 weeks prior. Fluid build up requiring drainage twice in the last 2 weeks, last drainage yesterday. Increased swelling since that time. EXAM:  LEFT KNEE - COMPLETE 4+ VIEW COMPARISON:  None. FINDINGS: Moderate to large suprapatellar joint effusion. No definite internal air. No acute osseous abnormalities. Prior ACL repair, anchors are in appropriate position. Medial tibial femoral joint space narrowing and peripheral spurring. Tiny patellofemoral osteophytes. No bony destructive change. IMPRESSION: Moderate to large suprapatellar joint effusion. No acute osseous abnormality or bony destructive change. Electronically Signed   By: Jeb Levering M.D.   On: 07/29/2016 20:47    Procedures .Joint Aspiration/Arthrocentesis Date/Time: 07/30/2016 1:24 AM Performed by: Jola Schmidt Authorized by: Jola Schmidt   Consent:    Consent obtained:  Verbal   Consent given by:  Patient   Risks discussed:  Bleeding, infection and pain Location:    Location:  Knee   Knee:  L knee Anesthesia (see MAR for exact dosages):    Anesthesia method:  Local infiltration   Local anesthetic:  Lidocaine 2% WITH epi Procedure details:    Preparation: Patient was prepped and draped in usual sterile fashion     Needle gauge:  18 G   Ultrasound guidance: no     Approach:  Lateral   Aspirate amount:  80cc   Aspirate characteristics:  Yellow and cloudy   Steroid injected: no     Specimen collected: yes   Post-procedure details:    Patient tolerance of procedure:  Tolerated well, no immediate complications   (including critical care time)  Medications Ordered in ED Medications - No data to display   Initial Impression / Assessment and Plan / ED Course  I have reviewed the triage vital signs and the nursing notes.  Pertinent labs & imaging results that were available during my care of the patient were reviewed by me and considered in my medical decision making (see chart for details).  Clinical Course    I spoke with Joanell Rising with [facility] who agrees that I should move forward with repeat arthrocentesis. I will call him with the  results  4:36 AM Spoke with [facility] who will see the patient in the ER and Plan on admission for operative washout of the left knee today.  They agree with antibiotics at this time.  He  has been cultured.  We'll continue to treat the patient's pain aggressively while in the emergency department.    Final Clinical Impressions(s) / ED Diagnoses   Final diagnoses:  Pyogenic arthritis of left knee joint, due to unspecified organism [facility])      I personally performed the services described in this documentation, which was scribed in my presence. The recorded information has been reviewed and is accurate.      New Prescriptions New Prescriptions   No medications on file     Jola Schmidt, MD 07/30/16 [phone]

## 2016-07-29 NOTE — ED Triage Notes (Signed)
Pt states he left knee surgery 2 weeks ago. States that he has had fluid up in that knee and has had it drained twice. Last time being yesterday. States that the knee has gotten swollen again since yesterday. Ortho started pt on antibiotics yesterday.

## 2016-07-30 ENCOUNTER — Inpatient Hospital Stay (HOSPITAL_COMMUNITY): Payer: BLUE CROSS/BLUE SHIELD | Admitting: Certified Registered Nurse Anesthetist

## 2016-07-30 ENCOUNTER — Encounter (HOSPITAL_COMMUNITY)
Admission: EM | Disposition: A | Discharge status: Home Health | Payer: Self-pay | Source: Home / Self Care | Attending: Orthopaedic Surgery

## 2016-07-30 DIAGNOSIS — I10 Essential (primary) hypertension: Secondary | ICD-10-CM | POA: Diagnosis present

## 2016-07-30 DIAGNOSIS — Z85828 Personal history of other malignant neoplasm of skin: Secondary | ICD-10-CM | POA: Diagnosis not present

## 2016-07-30 DIAGNOSIS — M00862 Arthritis due to other bacteria, left knee: Secondary | ICD-10-CM

## 2016-07-30 DIAGNOSIS — T814XXA Infection following a procedure, initial encounter: Secondary | ICD-10-CM | POA: Diagnosis not present

## 2016-07-30 DIAGNOSIS — M009 Pyogenic arthritis, unspecified: Secondary | ICD-10-CM | POA: Diagnosis present

## 2016-07-30 DIAGNOSIS — Z113 Encounter for screening for infections with a predominantly sexual mode of transmission: Secondary | ICD-10-CM | POA: Diagnosis not present

## 2016-07-30 DIAGNOSIS — J45909 Unspecified asthma, uncomplicated: Secondary | ICD-10-CM | POA: Diagnosis present

## 2016-07-30 DIAGNOSIS — Z87891 Personal history of nicotine dependence: Secondary | ICD-10-CM | POA: Diagnosis not present

## 2016-07-30 DIAGNOSIS — M25562 Pain in left knee: Secondary | ICD-10-CM | POA: Diagnosis present

## 2016-07-30 DIAGNOSIS — A4902 Methicillin resistant Staphylococcus aureus infection, unspecified site: Secondary | ICD-10-CM | POA: Diagnosis not present

## 2016-07-30 DIAGNOSIS — B9689 Other specified bacterial agents as the cause of diseases classified elsewhere: Secondary | ICD-10-CM | POA: Diagnosis not present

## 2016-07-30 DIAGNOSIS — K219 Gastro-esophageal reflux disease without esophagitis: Secondary | ICD-10-CM | POA: Diagnosis not present

## 2016-07-30 HISTORY — PX: I & D EXTREMITY: SHX5045

## 2016-07-30 LAB — SYNOVIAL CELL COUNT + DIFF, W/ CRYSTALS
Crystals, Fluid: NONE SEEN
MONOCYTE-MACROPHAGE-SYNOVIAL FLUID: 5 % — AB (ref 50–90)
NEUTROPHIL, SYNOVIAL: 95 % — AB (ref 0–25)
WBC, SYNOVIAL: 89250 /mm3 — AB (ref 0–200)

## 2016-07-30 LAB — SEDIMENTATION RATE: Sed Rate: 75 mm/hr — ABNORMAL HIGH (ref 0–16)

## 2016-07-30 LAB — C-REACTIVE PROTEIN: CRP: 41.1 mg/dL — ABNORMAL HIGH (ref <1.0)

## 2016-07-30 LAB — SURGICAL PCR SCREEN
MRSA, PCR: POSITIVE — AB
STAPHYLOCOCCUS AUREUS: POSITIVE — AB

## 2016-07-30 LAB — LACTIC ACID, PLASMA: LACTIC ACID, VENOUS: 0.9 mmol/L (ref 0.5–1.9)

## 2016-07-30 SURGERY — IRRIGATION AND DEBRIDEMENT EXTREMITY
Anesthesia: General | Laterality: Left

## 2016-07-30 MED ORDER — DEXTROSE 5 % IV SOLN
2.0000 g | INTRAVENOUS | Status: DC
  Administered 2016-07-30 – 2016-07-31 (×2): 2 g via INTRAVENOUS
  Filled 2016-07-30 (×3): qty 2

## 2016-07-30 MED ORDER — ALBUTEROL SULFATE HFA 108 (90 BASE) MCG/ACT IN AERS: 2.0000 | INHALATION_SPRAY | RESPIRATORY_TRACT | Status: DC | PRN

## 2016-07-30 MED ORDER — HYDROMORPHONE HCL 1 MG/ML IJ SOLN
1.5000 mg | Freq: Once | INTRAMUSCULAR | Status: AC
  Administered 2016-07-30: 1.5 mg via INTRAVENOUS
  Filled 2016-07-30: qty 2

## 2016-07-30 MED ORDER — ACETAMINOPHEN 500 MG PO TABS
ORAL_TABLET | ORAL | Status: AC
  Filled 2016-07-30: qty 2

## 2016-07-30 MED ORDER — POLYMYXIN B SULFATE 500000 UNITS IJ SOLR
INTRAMUSCULAR | Status: DC | PRN
  Administered 2016-07-30: 500 mL

## 2016-07-30 MED ORDER — SODIUM CHLORIDE 0.9 % IV SOLN
1250.0000 mg | Freq: Once | INTRAVENOUS | Status: AC
  Administered 2016-07-30: 1250 mg via INTRAVENOUS
  Filled 2016-07-30: qty 1250

## 2016-07-30 MED ORDER — DOCUSATE SODIUM 100 MG PO CAPS
100.0000 mg | ORAL_CAPSULE | Freq: Two times a day (BID) | ORAL | Status: DC
  Administered 2016-07-30 – 2016-08-02 (×6): 100 mg via ORAL
  Filled 2016-07-30 (×6): qty 1

## 2016-07-30 MED ORDER — METOCLOPRAMIDE HCL 5 MG PO TABS: 5.0000 mg | ORAL_TABLET | Freq: Three times a day (TID) | ORAL | Status: DC | PRN

## 2016-07-30 MED ORDER — HYDROMORPHONE HCL 1 MG/ML IJ SOLN
INTRAMUSCULAR | Status: AC
  Filled 2016-07-30: qty 1

## 2016-07-30 MED ORDER — AMLODIPINE BESYLATE 10 MG PO TABS
10.0000 mg | ORAL_TABLET | Freq: Every day | ORAL | Status: DC
  Administered 2016-07-30 – 2016-08-02 (×4): 10 mg via ORAL
  Filled 2016-07-30 (×4): qty 1

## 2016-07-30 MED ORDER — HYDROCODONE-ACETAMINOPHEN 10-325 MG PO TABS
1.0000 | ORAL_TABLET | ORAL | Status: DC | PRN
  Administered 2016-07-30 – 2016-08-02 (×15): 2 via ORAL
  Filled 2016-07-30 (×15): qty 2

## 2016-07-30 MED ORDER — FENTANYL CITRATE (PF) 100 MCG/2ML IJ SOLN
25.0000 ug | INTRAMUSCULAR | Status: DC | PRN
  Administered 2016-07-30 (×3): 50 ug via INTRAVENOUS

## 2016-07-30 MED ORDER — HYDROCODONE-ACETAMINOPHEN 7.5-325 MG PO TABS
1.0000 | ORAL_TABLET | ORAL | Status: DC | PRN
  Administered 2016-07-30 (×2): 2 via ORAL
  Filled 2016-07-30 (×2): qty 2

## 2016-07-30 MED ORDER — ACETAMINOPHEN 325 MG PO TABS: 650.0000 mg | ORAL_TABLET | Freq: Four times a day (QID) | ORAL | Status: DC | PRN

## 2016-07-30 MED ORDER — FAMOTIDINE 20 MG PO TABS
20.0000 mg | ORAL_TABLET | Freq: Every day | ORAL | Status: DC
  Administered 2016-07-30 – 2016-08-02 (×4): 20 mg via ORAL
  Filled 2016-07-30 (×4): qty 1

## 2016-07-30 MED ORDER — MIDAZOLAM HCL 2 MG/2ML IJ SOLN
INTRAMUSCULAR | Status: AC
  Filled 2016-07-30: qty 2

## 2016-07-30 MED ORDER — LACTATED RINGERS IV SOLN: INTRAVENOUS | Status: DC

## 2016-07-30 MED ORDER — SUCCINYLCHOLINE CHLORIDE 200 MG/10ML IV SOSY
PREFILLED_SYRINGE | INTRAVENOUS | Status: AC
  Filled 2016-07-30: qty 10

## 2016-07-30 MED ORDER — KETOROLAC TROMETHAMINE 30 MG/ML IJ SOLN
30.0000 mg | Freq: Once | INTRAMUSCULAR | Status: AC
  Administered 2016-07-30: 30 mg via INTRAVENOUS
  Filled 2016-07-30: qty 1

## 2016-07-30 MED ORDER — SUCCINYLCHOLINE CHLORIDE 20 MG/ML IJ SOLN
INTRAMUSCULAR | Status: DC | PRN
  Administered 2016-07-30: 100 mg via INTRAVENOUS

## 2016-07-30 MED ORDER — CHLORHEXIDINE GLUCONATE 4 % EX LIQD: 60.0000 mL | Freq: Once | CUTANEOUS | Status: DC

## 2016-07-30 MED ORDER — ALBUTEROL SULFATE (2.5 MG/3ML) 0.083% IN NEBU: 2.5000 mg | INHALATION_SOLUTION | RESPIRATORY_TRACT | Status: DC | PRN

## 2016-07-30 MED ORDER — FENTANYL CITRATE (PF) 100 MCG/2ML IJ SOLN
INTRAMUSCULAR | Status: AC
  Filled 2016-07-30: qty 2

## 2016-07-30 MED ORDER — DIPHENHYDRAMINE HCL 12.5 MG/5ML PO ELIX: 12.5000 mg | ORAL_SOLUTION | ORAL | Status: DC | PRN

## 2016-07-30 MED ORDER — POVIDONE-IODINE 10 % EX SWAB: 2.0000 "application " | Freq: Once | CUTANEOUS | Status: DC

## 2016-07-30 MED ORDER — LACTATED RINGERS IV SOLN
INTRAVENOUS | Status: DC
  Administered 2016-07-30 (×2): via INTRAVENOUS

## 2016-07-30 MED ORDER — MORPHINE SULFATE (PF) 4 MG/ML IV SOLN
4.0000 mg | Freq: Once | INTRAVENOUS | Status: AC
  Administered 2016-07-30: 4 mg via INTRAVENOUS
  Filled 2016-07-30: qty 1

## 2016-07-30 MED ORDER — PROMETHAZINE HCL 25 MG/ML IJ SOLN: 6.2500 mg | INTRAMUSCULAR | Status: DC | PRN

## 2016-07-30 MED ORDER — FENTANYL CITRATE (PF) 250 MCG/5ML IJ SOLN
INTRAMUSCULAR | Status: AC
  Filled 2016-07-30: qty 5

## 2016-07-30 MED ORDER — ONDANSETRON HCL 4 MG/2ML IJ SOLN
4.0000 mg | Freq: Four times a day (QID) | INTRAMUSCULAR | Status: DC | PRN
  Administered 2016-07-30: 4 mg via INTRAVENOUS

## 2016-07-30 MED ORDER — METHOCARBAMOL 1000 MG/10ML IJ SOLN
500.0000 mg | Freq: Four times a day (QID) | INTRAMUSCULAR | Status: DC | PRN
  Filled 2016-07-30: qty 5

## 2016-07-30 MED ORDER — HYDROCODONE-ACETAMINOPHEN 7.5-325 MG PO TABS: 1.0000 | ORAL_TABLET | ORAL | Status: DC | PRN

## 2016-07-30 MED ORDER — METHOCARBAMOL 1000 MG/10ML IJ SOLN: 500.0000 mg | Freq: Four times a day (QID) | INTRAMUSCULAR | Status: DC | PRN

## 2016-07-30 MED ORDER — ONDANSETRON HCL 4 MG/2ML IJ SOLN: 4.0000 mg | Freq: Four times a day (QID) | INTRAMUSCULAR | Status: DC | PRN

## 2016-07-30 MED ORDER — HYDROMORPHONE HCL 1 MG/ML IJ SOLN: 1.0000 mg | Freq: Once | INTRAMUSCULAR | Status: DC

## 2016-07-30 MED ORDER — METHOCARBAMOL 500 MG PO TABS
500.0000 mg | ORAL_TABLET | Freq: Four times a day (QID) | ORAL | Status: DC | PRN
  Administered 2016-07-30 – 2016-07-31 (×2): 500 mg via ORAL
  Filled 2016-07-30 (×2): qty 1

## 2016-07-30 MED ORDER — FENTANYL CITRATE (PF) 100 MCG/2ML IJ SOLN
INTRAMUSCULAR | Status: DC | PRN
  Administered 2016-07-30 (×2): 50 ug via INTRAVENOUS
  Administered 2016-07-30: 150 ug via INTRAVENOUS
  Administered 2016-07-30 (×3): 50 ug via INTRAVENOUS

## 2016-07-30 MED ORDER — LIDOCAINE HCL (CARDIAC) 20 MG/ML IV SOLN
INTRAVENOUS | Status: DC | PRN
  Administered 2016-07-30: 100 mg via INTRAVENOUS

## 2016-07-30 MED ORDER — DOCUSATE SODIUM 100 MG PO CAPS: 100.0000 mg | ORAL_CAPSULE | Freq: Two times a day (BID) | ORAL | Status: DC

## 2016-07-30 MED ORDER — DEXTROSE 5 % IV SOLN
1.0000 g | Freq: Once | INTRAVENOUS | Status: AC
  Administered 2016-07-30: 1 g via INTRAVENOUS
  Filled 2016-07-30: qty 10

## 2016-07-30 MED ORDER — METHOCARBAMOL 500 MG PO TABS: 500.0000 mg | ORAL_TABLET | Freq: Four times a day (QID) | ORAL | Status: DC | PRN

## 2016-07-30 MED ORDER — PROPOFOL 10 MG/ML IV BOLUS
INTRAVENOUS | Status: DC | PRN
  Administered 2016-07-30: 200 mg via INTRAVENOUS

## 2016-07-30 MED ORDER — HYDROMORPHONE HCL 1 MG/ML IJ SOLN
1.0000 mg | INTRAMUSCULAR | Status: DC | PRN
  Administered 2016-07-30 (×3): 1 mg via INTRAVENOUS
  Filled 2016-07-30 (×3): qty 1

## 2016-07-30 MED ORDER — MIDAZOLAM HCL 5 MG/5ML IJ SOLN
INTRAMUSCULAR | Status: DC | PRN
  Administered 2016-07-30: 2 mg via INTRAVENOUS

## 2016-07-30 MED ORDER — HYDROMORPHONE HCL 1 MG/ML IJ SOLN
1.0000 mg | INTRAMUSCULAR | Status: AC | PRN
  Administered 2016-07-30: 1 mg via INTRAVENOUS
  Filled 2016-07-30: qty 1

## 2016-07-30 MED ORDER — VANCOMYCIN HCL 10 G IV SOLR
1500.0000 mg | Freq: Two times a day (BID) | INTRAVENOUS | Status: DC
  Administered 2016-07-31 – 2016-08-01 (×3): 1500 mg via INTRAVENOUS
  Filled 2016-07-30 (×3): qty 1500

## 2016-07-30 MED ORDER — VANCOMYCIN HCL 10 G IV SOLR
1500.0000 mg | INTRAVENOUS | Status: AC
  Administered 2016-07-30: 1500 mg via INTRAVENOUS
  Filled 2016-07-30: qty 1500

## 2016-07-30 MED ORDER — LIDOCAINE 2% (20 MG/ML) 5 ML SYRINGE
INTRAMUSCULAR | Status: AC
  Filled 2016-07-30: qty 5

## 2016-07-30 MED ORDER — DEXTROSE-NACL 5-0.45 % IV SOLN: INTRAVENOUS | Status: DC

## 2016-07-30 MED ORDER — SODIUM CHLORIDE 0.9 % IR SOLN
Status: DC | PRN
  Administered 2016-07-30: 6000 mL

## 2016-07-30 MED ORDER — ACETAMINOPHEN 650 MG RE SUPP: 650.0000 mg | Freq: Four times a day (QID) | RECTAL | Status: DC | PRN

## 2016-07-30 MED ORDER — LISINOPRIL 20 MG PO TABS
20.0000 mg | ORAL_TABLET | Freq: Every day | ORAL | Status: DC
  Administered 2016-07-30 – 2016-08-02 (×4): 20 mg via ORAL
  Filled 2016-07-30 (×4): qty 1

## 2016-07-30 MED ORDER — ONDANSETRON HCL 4 MG PO TABS: 4.0000 mg | ORAL_TABLET | Freq: Four times a day (QID) | ORAL | Status: DC | PRN

## 2016-07-30 MED ORDER — METOCLOPRAMIDE HCL 5 MG/ML IJ SOLN: 5.0000 mg | Freq: Three times a day (TID) | INTRAMUSCULAR | Status: DC | PRN

## 2016-07-30 MED ORDER — HYDROMORPHONE HCL 1 MG/ML IJ SOLN
1.0000 mg | Freq: Once | INTRAMUSCULAR | Status: AC
  Administered 2016-07-30: 0.5 mg via INTRAVENOUS

## 2016-07-30 MED ORDER — ACETAMINOPHEN 500 MG PO TABS
1000.0000 mg | ORAL_TABLET | Freq: Once | ORAL | Status: AC
  Administered 2016-07-30: 1000 mg via ORAL

## 2016-07-30 MED ORDER — AMPHETAMINE-DEXTROAMPHETAMINE 10 MG PO TABS: 30.0000 mg | ORAL_TABLET | Freq: Every day | ORAL | Status: DC | PRN

## 2016-07-30 MED ORDER — CALCIUM CARBONATE ANTACID 500 MG PO CHEW
1.0000 | CHEWABLE_TABLET | ORAL | Status: DC | PRN
  Administered 2016-08-01: 200 mg via ORAL
  Filled 2016-07-30 (×2): qty 1

## 2016-07-30 MED ORDER — HYDROMORPHONE HCL 1 MG/ML IJ SOLN
1.0000 mg | INTRAMUSCULAR | Status: DC | PRN
  Administered 2016-07-30 – 2016-07-31 (×9): 1 mg via INTRAVENOUS
  Filled 2016-07-30 (×9): qty 1

## 2016-07-30 MED ORDER — BISACODYL 5 MG PO TBEC: 5.0000 mg | DELAYED_RELEASE_TABLET | Freq: Every day | ORAL | Status: DC | PRN

## 2016-07-30 MED ORDER — PROPOFOL 10 MG/ML IV BOLUS
INTRAVENOUS | Status: AC
  Filled 2016-07-30: qty 20

## 2016-07-30 MED ORDER — HYDROMORPHONE HCL 1 MG/ML IJ SOLN
1.0000 mg | INTRAMUSCULAR | Status: AC | PRN
  Administered 2016-07-30 (×3): 1 mg via INTRAVENOUS
  Filled 2016-07-30 (×3): qty 1

## 2016-07-30 SURGICAL SUPPLY — 43 items
BANDAGE ACE 6X5 VEL STRL LF (GAUZE/BANDAGES/DRESSINGS) ×3 IMPLANT
BANDAGE ELASTIC 4 VELCRO ST LF (GAUZE/BANDAGES/DRESSINGS) ×3 IMPLANT
BANDAGE ELASTIC 6 VELCRO ST LF (GAUZE/BANDAGES/DRESSINGS) ×3 IMPLANT
BLADE SURG 10 STRL SS (BLADE) ×3 IMPLANT
BNDG COHESIVE 4X5 TAN STRL (GAUZE/BANDAGES/DRESSINGS) ×3 IMPLANT
BNDG GAUZE ELAST 4 BULKY (GAUZE/BANDAGES/DRESSINGS) ×3 IMPLANT
COVER SURGICAL LIGHT HANDLE (MISCELLANEOUS) ×3 IMPLANT
CUFF TOURNIQUET SINGLE 34IN LL (TOURNIQUET CUFF) IMPLANT
CUFF TOURNIQUET SINGLE 44IN (TOURNIQUET CUFF) IMPLANT
DRSG ADAPTIC 3X8 NADH LF (GAUZE/BANDAGES/DRESSINGS) ×3 IMPLANT
DRSG PAD ABDOMINAL 8X10 ST (GAUZE/BANDAGES/DRESSINGS) ×2 IMPLANT
DURAPREP 26ML APPLICATOR (WOUND CARE) ×3 IMPLANT
ELECT REM PT RETURN 9FT ADLT (ELECTROSURGICAL)
ELECTRODE REM PT RTRN 9FT ADLT (ELECTROSURGICAL) IMPLANT
EVACUATOR 1/8 PVC DRAIN (DRAIN) IMPLANT
FACESHIELD WRAPAROUND (MASK) ×6 IMPLANT
GAUZE SPONGE 4X4 12PLY STRL (GAUZE/BANDAGES/DRESSINGS) ×3 IMPLANT
GAUZE XEROFORM 1X8 LF (GAUZE/BANDAGES/DRESSINGS) ×3 IMPLANT
GLOVE BIO SURGEON STRL SZ8 (GLOVE) ×12 IMPLANT
GOWN STRL REUS W/ TWL LRG LVL3 (GOWN DISPOSABLE) ×3 IMPLANT
GOWN STRL REUS W/TWL 2XL LVL3 (GOWN DISPOSABLE) ×3 IMPLANT
GOWN STRL REUS W/TWL LRG LVL3 (GOWN DISPOSABLE) ×9
HANDPIECE INTERPULSE COAX TIP (DISPOSABLE)
KIT BASIN OR (CUSTOM PROCEDURE TRAY) ×3 IMPLANT
KIT ROOM TURNOVER OR (KITS) ×3 IMPLANT
MANIFOLD NEPTUNE II (INSTRUMENTS) ×3 IMPLANT
NS IRRIG 1000ML POUR BTL (IV SOLUTION) ×3 IMPLANT
PACK ORTHO EXTREMITY (CUSTOM PROCEDURE TRAY) ×3 IMPLANT
PAD ARMBOARD 7.5X6 YLW CONV (MISCELLANEOUS) ×6 IMPLANT
PENCIL BUTTON HOLSTER BLD 10FT (ELECTRODE) IMPLANT
SET HNDPC FAN SPRY TIP SCT (DISPOSABLE) IMPLANT
SPONGE GAUZE 4X4 12PLY STER LF (GAUZE/BANDAGES/DRESSINGS) ×3 IMPLANT
SPONGE LAP 18X18 X RAY DECT (DISPOSABLE) ×3 IMPLANT
STOCKINETTE IMPERVIOUS 9X36 MD (GAUZE/BANDAGES/DRESSINGS) ×3 IMPLANT
SUT ETHILON 3 0 PS 1 (SUTURE) IMPLANT
TOWEL OR 17X24 6PK STRL BLUE (TOWEL DISPOSABLE) ×3 IMPLANT
TOWEL OR 17X26 10 PK STRL BLUE (TOWEL DISPOSABLE) ×3 IMPLANT
TUBE ANAEROBIC SPECIMEN COL (MISCELLANEOUS) IMPLANT
TUBE CONNECTING 12'X1/4 (SUCTIONS) ×1
TUBE CONNECTING 12X1/4 (SUCTIONS) ×2 IMPLANT
UNDERPAD 30X30 INCONTINENT (UNDERPADS AND DIAPERS) ×3 IMPLANT
WATER STERILE IRR 1000ML POUR (IV SOLUTION) ×3 IMPLANT
YANKAUER SUCT BULB TIP NO VENT (SUCTIONS) ×3 IMPLANT

## 2016-07-30 NOTE — Transfer of Care (Signed)
Immediate Anesthesia Transfer of Care Note  Patient: [name]  Procedure(s) Performed: Procedure(s): left knee arthroscopic wash out (Left)  Patient Location: PACU  Anesthesia Type:General  Level of Consciousness: awake, alert , oriented and patient cooperative  Airway & Oxygen Therapy: Patient Spontanous Breathing  Post-op Assessment: Report given to RN and Post -op Vital signs reviewed and stable  Post vital signs: Reviewed and stable  Last Vitals:  Vitals:   07/30/16 0833 07/30/16 1053  BP: (!) 169/96 (!) 160/93  Pulse: (!) 104 97  Resp:    Temp: 37.2 C 37 C    Last Pain:  Vitals:   07/30/16 1100  TempSrc:   PainSc: 10-Worst pain ever         Complications: No apparent anesthesia complications

## 2016-07-30 NOTE — Progress Notes (Signed)
Pt returned from PACU with ace wrap to left knee. Wife at bedside. Pt is c/o more pain now than before procedure. Pain has been discussed by PACU staff with anesthsia prior to arriving on unit. Will observe and administer pain meds as needed/ordered.

## 2016-07-30 NOTE — ED Notes (Signed)
Attempted to call report at this time.  Receiving nurse unavailable at this time.

## 2016-07-30 NOTE — Consult Note (Signed)
Date of Admission:  07/29/2016  Date of Consult:  07/30/2016  Reason for Consult: Septic arthritis Referring Physician: Dr. Rhona Raider   HPI: [name] is an 54 y.o. male with recent left knee arthroscopically on 07/15/2016. He developed swelling and erythema pain at the site and was seen in orthopedic surgeries office on Tuesday and had aspirate of the knee performed with cell count that was  elevated to around 25,000 was negative for crystals and cultures failed to grow any organism. Him back and had a second aspiration but again showed white blood cell count in the joint but now down to 17,000 and nothing grew on culture he was given Keflex which he took. His knee pain and effusion unfortunately worsened and he came to the emergency department where [place] at [address]. He was started on echo mycin and ceftriaxone and was taken to the operating room were deeper cultures were obtained.     Past Medical History:  Diagnosis Date  . Allergy   . Asthma    bronchitis  . Colon polyps   . GERD (gastroesophageal reflux disease)   . Hypertension   . Pleurisy    hx-12/13  . Rectal fissure   . Seasonal allergies   . Skin cancer     Past Surgical History:  Procedure Laterality Date  . APPENDECTOMY  2006   lap append  . CHOLECYSTECTOMY    . [place]  2005  . KNEE ARTHROSCOPY  2009   left  . KNEE ARTHROSCOPY Left 06/12/2013   Procedure: LEFT KNEE ARTHROSCOPY PARTIAL MEDIAL MENISECTOMY WITH CHONDROPLASTY;  Surgeon: Hessie Dibble, MD;  Location: [facility];  Service: Orthopedics;  Laterality: Left;  . KNEE SURGERY  2005   lt acl  . NASAL FRACTURE SURGERY  2012   closed reduction   . SHOULDER SURGERY  2010   left    Social History:  reports that he quit smoking about 31 years ago. His smoking use included Cigarettes and Cigars. He has a 1.00 pack-year smoking history. He has never used smokeless tobacco. He reports that he drinks alcohol. He  reports that he uses drugs, including Marijuana, about 2 times per week.   Family History  Problem Relation Age of Onset  . Heart attack Mother   . Skin cancer Mother   . Hypertension Mother   . Hyperlipidemia Mother   . Stroke Mother   . Heart attack Father   . Bipolar disorder Maternal Grandmother   . Arthritis Maternal Grandmother   . Hyperlipidemia Maternal Grandmother   . Hypertension Maternal Grandmother   . Arthritis Maternal Grandfather   . Hyperlipidemia Maternal Grandfather   . Stroke Maternal Grandfather   . Hypertension Maternal Grandfather   . Arthritis Paternal Grandmother   . Hypertension Paternal Grandmother   . Arthritis Paternal Grandfather   . Hypertension Paternal Grandfather   . Hyperlipidemia Brother     Allergies  Allergen Reactions  . Codeine Nausea Only     Medications: I have reviewed patients current medications as documented in Epic Anti-infectives    Start     Dose/Rate Route Frequency Ordered Stop   07/31/16 1300  vancomycin (VANCOCIN) 1,500 mg in sodium chloride 0.9 % 500 mL IVPB     1,500 mg 250 mL/hr over 120 Minutes Intravenous Every 12 hours 07/30/16 1607 08/03/16 1259   07/30/16 1700  cefTRIAXone (ROCEPHIN) 2 g in dextrose 5 % 50 mL IVPB  2 g 100 mL/hr over 30 Minutes Intravenous Every 24 hours 07/30/16 1628     07/30/16 1322  polymyxin B 500,000 Units, bacitracin 50,000 Units in sodium chloride irrigation 0.9 % 500 mL irrigation  Status:  Discontinued       As needed 07/30/16 1322 07/30/16 1347   07/30/16 1215  vancomycin (VANCOCIN) 1,500 mg in sodium chloride 0.9 % 500 mL IVPB     1,500 mg 250 mL/hr over 120 Minutes Intravenous On call to O.R. 07/30/16 1206 07/30/16 1440   07/30/16 0400  vancomycin (VANCOCIN) 1,250 mg in sodium chloride 0.9 % 250 mL IVPB     1,250 mg 166.7 mL/hr over 90 Minutes Intravenous  Once 07/30/16 0346 07/30/16 0746   07/30/16 0400  cefTRIAXone (ROCEPHIN) 1 g in dextrose 5 % 50 mL IVPB     1 g 100  mL/hr over 30 Minutes Intravenous  Once 07/30/16 0350 07/30/16 0448         ROS:  as in HPI otherwise remainder of 12 point Review of Systems is negative  Blood pressure (!) 172/101, pulse 99, temperature 97.9 F (36.6 C), temperature source Oral, resp. rate 18, SpO2 99 %. General: Alert and awake, oriented But groaning in pain  HEENT: anicteric sclera,  EOMI, oropharynx clear and without exudate Cardiovascular cardiac, normal r,  no murmur rubs or gallops Pulmonary: clear to auscultation bilaterally, no wheezing, rales or rhonchi Gastrointestinal: soft nontender, nondistended, normal bowel sounds, Musculoskeletal: Left knee with dressing  Skin, soft tissue: no rashes Neuro: nonfocal, strength and sensation intact   Results for orders placed or performed during the hospital encounter of 07/29/16 (from the past 48 hour(s))  Synovial cell count + diff, w/ crystals     Status: Abnormal   Collection Time: 07/29/16  1:31 AM  Result Value Ref Range   Color, Synovial YELLOW YELLOW   Appearance-Synovial TURBID (A) CLEAR   Crystals, Fluid NO CRYSTALS SEEN    WBC, Synovial 89,250 (H) 0 - 200 /cu mm   Neutrophil, Synovial 95 (H) 0 - 25 %   Monocyte-Macrophage-Synovial Fluid 5 (L) 50 - 90 %  CBC     Status: Abnormal   Collection Time: 07/29/16  8:16 PM  Result Value Ref Range   WBC 21.1 (H) 4.0 - 10.5 K/uL   RBC 4.94 4.22 - 5.81 MIL/uL   Hemoglobin 15.5 13.0 - 17.0 g/dL   HCT 45.1 39.0 - 52.0 %   MCV 91.3 78.0 - 100.0 fL   MCH 31.4 26.0 - 34.0 pg   MCHC 34.4 30.0 - 36.0 g/dL   RDW 13.4 11.5 - 15.5 %   Platelets 307 150 - 400 K/uL  Basic metabolic panel     Status: Abnormal   Collection Time: 07/29/16  8:16 PM  Result Value Ref Range   Sodium 131 (L) 135 - 145 mmol/L   Potassium 4.0 3.5 - 5.1 mmol/L   Chloride 96 (L) 101 - 111 mmol/L   CO2 23 22 - 32 mmol/L   Glucose, Bld 138 (H) 65 - 99 mg/dL   BUN 15 6 - 20 mg/dL   Creatinine, Ser 1.02 0.61 - 1.24 mg/dL   Calcium 9.1 8.9 -  10.3 mg/dL   GFR calc non Af Amer >60 >60 mL/min   GFR calc Af Amer >60 >60 mL/min    Comment: (NOTE) The eGFR has been calculated using the CKD EPI equation. This calculation has not been validated in all clinical situations. eGFR's persistently <60 mL/min signify  possible Chronic Kidney Disease.    Anion gap 12 5 - 15  Body fluid culture     Status: None (Preliminary result)   Collection Time: 07/29/16 11:59 PM  Result Value Ref Range   Specimen Description SYNOVIAL LEFT KNEE    Special Requests NONE    Gram Stain      ABUNDANT WBC PRESENT, PREDOMINANTLY PMN NO ORGANISMS SEEN    Culture PENDING    Report Status PENDING   Lactic acid, plasma     Status: None   Collection Time: 07/30/16 12:30 AM  Result Value Ref Range   Lactic Acid, Venous 0.9 0.5 - 1.9 mmol/L  Surgical pcr screen     Status: Abnormal   Collection Time: 07/30/16 11:18 AM  Result Value Ref Range   MRSA, PCR POSITIVE (A) NEGATIVE    Comment: RESULT CALLED TO, READ BACK BY AND VERIFIED WITH: R. DUNN 13:25 07/30/16 ([email or web address])    Staphylococcus aureus POSITIVE (A) NEGATIVE    Comment:        The Xpert SA Assay (FDA approved for NASAL specimens in patients over 69 years of age), is one component of a comprehensive surveillance program.  Test performance has been validated by [facility] for patients greater than or equal to 68 year old. It is not intended to diagnose infection nor to guide or monitor treatment.    _0 (sdes,specrequest,cult,reptstatus)   ) Recent Results (from the past 720 hour(s))  Body fluid culture     Status: None (Preliminary result)   Collection Time: 07/29/16 11:59 PM  Result Value Ref Range Status   Specimen Description SYNOVIAL LEFT KNEE  Final   Special Requests NONE  Final   Gram Stain   Final    ABUNDANT WBC PRESENT, PREDOMINANTLY PMN NO ORGANISMS SEEN    Culture PENDING  Incomplete   Report Status PENDING  Incomplete  Surgical pcr screen     Status:  Abnormal   Collection Time: 07/30/16 11:18 AM  Result Value Ref Range Status   MRSA, PCR POSITIVE (A) NEGATIVE Final    Comment: RESULT CALLED TO, READ BACK BY AND VERIFIED WITH: R. DUNN 13:25 07/30/16 ([email or web address])    Staphylococcus aureus POSITIVE (A) NEGATIVE Final    Comment:        The Xpert SA Assay (FDA approved for NASAL specimens in patients over 53 years of age), is one component of a comprehensive surveillance program.  Test performance has been validated by [facility] for patients greater than or equal to 20 year old. It is not intended to diagnose infection nor to guide or monitor treatment.      Impression/Recommendation  Active Problems:   Septic joint of left knee joint (HCC)   Left knee pain   [name] is a 55 y.o. male with  Septic knee after arthroscopy  #1 Septic knee:  Agree with vancomycin and ceftriaxone though would have held them to give perioperatively rather than before surgery  Keflex also may have influenced results  followup cultures  Check ESR, CRP  #2 Screening:  Check HIV and HCV  Dr. Megan Salon is covering this weekend and I will be back on Monday.   07/30/2016, 6:24 PM   Thank you so much for this interesting consult  [place] for [place] [phone] (pager) [phone] (office) 07/30/2016, 6:24 PM  Rhina Brackett [place] 07/30/2016, 6:24 PM

## 2016-07-30 NOTE — Op Note (Signed)
STANISLAW MORIARITY [id number] 07/30/2016   PRE-OP DIAGNOSIS: left knee infection   POST-OP DIAGNOSIS: same  PROCEDURE: left knee scope I$D  ANESTHESIA: general  [place]   Dictation #:  [id number]

## 2016-07-30 NOTE — Op Note (Signed)
[name], [name] NO.:  [id number]  MEDICAL RECORD NO.:  [id number]  LOCATION:  [id number]                        FACILITY:  [facility]  PHYSICIAN:  Monico Blitz. Waldine Zenz, M.D.DATE OF BIRTH:  09-Dec-1961  DATE OF PROCEDURE:  07/30/2016 DATE OF DISCHARGE:                              OPERATIVE REPORT   PREOPERATIVE DIAGNOSIS:  Left knee postoperative infection.  POSTOPERATIVE DIAGNOSIS:  Left knee postoperative infection.  PROCEDURE:  Left knee arthroscopic irrigation and debridement.  ANESTHESIA:  General.  SURGEON:  Monico Blitz. Rhona Raider, M.D.  ASSISTANT:  Loni Dolly, PA.  INDICATION FOR PROCEDURE:  The patient is a 55 year old man several weeks from a left knee arthroscopy.  Eventually, he did very well, but about a week after surgery, he started to develop pain.  He has had his knee aspirated serially with cell counts ranging from 20 down to 15, but unfortunately last night, he experienced some significant increase in pain and presented to the emergency room.  An aspirate at that visit was around 85,000.  His cultures have never grown any organisms, but with extreme pain and rising cell count, he is offered arthroscopic irrigation and debridement.  Informed operative consent was obtained after discussion of possible complications including reaction to anesthesia and obviously continued infection.  SUMMARY OF FINDINGS AND PROCEDURE:  Under general anesthesia, a left knee arthroscopic I and D was performed.  He had pus in the knee.  This was not cultured again as a sample had already been done just last night in the emergency room.  We performed a thorough I and D with 6 L of saline followed by 500 mL of antibiotic solution.  We placed 2 drains.  DESCRIPTION OF PROCEDURE:  The patient was taken to the operating suite, where general anesthetic was applied without difficulty.  He was positioned supine and prepped and draped in normal sterile fashion. After the  administration of IV vancomycin and appropriate time out, an arthroscopy was done through 2 old portals.  Findings were as noted above and procedure consisted of thorough irrigation and debridement.  I performed an examination of the knee arthroscopically and findings were identical to the findings 3 weeks ago at arthroscopy.  We ran the aforementioned irrigants through the knee, and at the end of the case, placed 2 limbs of large Hemovac drains through the 2 portals.  These were attached to the suction device.  A sterile dressing was applied along with a loose Ace wrap.  ESTIMATED BLOOD LOSS AND FLUIDS:  Obtain from anesthesia records.  DISPOSITION:  The patient was extubated in the operating room and taken to recovery in stable condition.  PLANS:  Plans were for him to be admitted back to the Orthopedic Surgery Service for appropriate postoperative care.  We will obtain an Infectious Disease consult and probably place a PICC line later today.     Monico Blitz Rhona Raider, M.D.     PGD/MEDQ  D:  07/30/2016  T:  07/30/2016  Job:  [id number]

## 2016-07-30 NOTE — OR Nursing (Signed)
PA called and informed of pt c/o feeling like dressing is too tight. Kerlix and Ace wrap are loose enough to get 2 fingers at least under dressing. Pt also stated that there was a little bit of numbness to lower leg and PA was also informed of that as well. Per PA this is ok for right now and will pass.

## 2016-07-30 NOTE — Progress Notes (Signed)
[name], [place] for Dr. Rhona Raider notified of positive nasal swab for MRSA.

## 2016-07-30 NOTE — Anesthesia Preprocedure Evaluation (Addendum)
Anesthesia Evaluation  Patient identified by MRN, date of birth, ID band Patient awake    Reviewed: Allergy & Precautions, NPO status , Patient's Chart, lab work & pertinent test results  Airway Mallampati: II  TM Distance: >3 FB Neck ROM: Full    Dental  (+) Teeth Intact, Dental Advisory Given   Pulmonary asthma , former smoker,    Pulmonary exam normal breath sounds clear to auscultation       Cardiovascular hypertension, Pt. on medications Normal cardiovascular exam Rhythm:Regular Rate:Normal     Neuro/Psych negative neurological ROS  negative psych ROS   GI/Hepatic Neg liver ROS, GERD  Medicated,  Endo/Other  negative endocrine ROS  Renal/GU negative Renal ROS     Musculoskeletal  (+) Arthritis , Osteoarthritis,    Abdominal   Peds  (+) ATTENTION DEFICIT DISORDER WITHOUT HYPERACTIVITY Hematology negative hematology ROS (+)   Anesthesia Other Findings Day of surgery medications reviewed with the patient.  Reproductive/Obstetrics                             Anesthesia Physical Anesthesia Plan  ASA: II and emergent  Anesthesia Plan: General   Post-op Pain Management:    Induction: Intravenous  Airway Management Planned: Oral ETT  Additional Equipment:   Intra-op Plan:   Post-operative Plan: Extubation in OR  Informed Consent: I have reviewed the patients History and Physical, chart, labs and discussed the procedure including the risks, benefits and alternatives for the proposed anesthesia with the patient or authorized representative who has indicated his/her understanding and acceptance.   Dental advisory given  Plan Discussed with: CRNA  Anesthesia Plan Comments: (Risks/benefits of general anesthesia discussed with patient including risk of damage to teeth, lips, gum, and tongue, nausea/vomiting, allergic reactions to medications, and the possibility of heart attack,  stroke and death.  All patient questions answered.  Patient wishes to proceed.)       Anesthesia Quick Evaluation

## 2016-07-30 NOTE — H&P (Signed)
[name] is an 55 y.o. male.   Chief Complaint: Septic left knee  HPI: [name] is a 55 y.o. male who presents to the Emergency Department complaining of worsening, constant, moderate left knee pain onset two weeks ago. Pt states he had left knee arthroscopy done by Dr. Rhona Raider 07/15/16. Pt states surgeon did cut tissue off. Per pt, he has had fluid build up in left knee which has been drained twice, latest being yesterday. Pt notes associated swelling, draining, nausea, and vomiting 2x. Per pt, drainage is thick and bloody. Pt was prescribed Keflex yesterday and has taken 6 pills with no relief. He was also given a shot of Toradol with no relief. Pt denies any fever.  Pt does mention until he is in extreme pain.   Past Medical History:  Diagnosis Date  . Allergy   . Asthma    bronchitis  . Colon polyps   . GERD (gastroesophageal reflux disease)   . Hypertension   . Pleurisy    hx-12/13  . Rectal fissure   . Seasonal allergies   . Skin cancer     Past Surgical History:  Procedure Laterality Date  . APPENDECTOMY  2006   lap append  . CHOLECYSTECTOMY    . [place]  2005  . KNEE ARTHROSCOPY  2009   left  . KNEE ARTHROSCOPY Left 06/12/2013   Procedure: LEFT KNEE ARTHROSCOPY PARTIAL MEDIAL MENISECTOMY WITH CHONDROPLASTY;  Surgeon: Hessie Dibble, MD;  Location: [facility];  Service: Orthopedics;  Laterality: Left;  . KNEE SURGERY  2005   lt acl  . NASAL FRACTURE SURGERY  2012   closed reduction   . SHOULDER SURGERY  2010   left    Family History  Problem Relation Age of Onset  . Heart attack Mother   . Skin cancer Mother   . Hypertension Mother   . Hyperlipidemia Mother   . Stroke Mother   . Heart attack Father   . Bipolar disorder Maternal Grandmother   . Arthritis Maternal Grandmother   . Hyperlipidemia Maternal Grandmother   . Hypertension Maternal Grandmother   . Arthritis Maternal Grandfather   . Hyperlipidemia Maternal  Grandfather   . Stroke Maternal Grandfather   . Hypertension Maternal Grandfather   . Arthritis Paternal Grandmother   . Hypertension Paternal Grandmother   . Arthritis Paternal Grandfather   . Hypertension Paternal Grandfather   . Hyperlipidemia Brother    Social History:  reports that he quit smoking about 31 years ago. His smoking use included Cigarettes and Cigars. He has a 1.00 pack-year smoking history. He has never used smokeless tobacco. He reports that he drinks alcohol. He reports that he uses drugs, including Marijuana, about 2 times per week.  Allergies:  Allergies  Allergen Reactions  . Codeine Nausea Only     (Not in a hospital admission)  Results for orders placed or performed during the hospital encounter of 07/29/16 (from the past 48 hour(s))  Synovial cell count + diff, w/ crystals     Status: Abnormal   Collection Time: 07/29/16  1:31 AM  Result Value Ref Range   Color, Synovial YELLOW YELLOW   Appearance-Synovial TURBID (A) CLEAR   Crystals, Fluid NO CRYSTALS SEEN    WBC, Synovial 89,250 (H) 0 - 200 /cu mm   Neutrophil, Synovial 95 (H) 0 - 25 %   Monocyte-Macrophage-Synovial Fluid 5 (L) 50 - 90 %  CBC     Status: Abnormal  Collection Time: 07/29/16  8:16 PM  Result Value Ref Range   WBC 21.1 (H) 4.0 - 10.5 K/uL   RBC 4.94 4.22 - 5.81 MIL/uL   Hemoglobin 15.5 13.0 - 17.0 g/dL   HCT 45.1 39.0 - 52.0 %   MCV 91.3 78.0 - 100.0 fL   MCH 31.4 26.0 - 34.0 pg   MCHC 34.4 30.0 - 36.0 g/dL   RDW 13.4 11.5 - 15.5 %   Platelets 307 150 - 400 K/uL  Basic metabolic panel     Status: Abnormal   Collection Time: 07/29/16  8:16 PM  Result Value Ref Range   Sodium 131 (L) 135 - 145 mmol/L   Potassium 4.0 3.5 - 5.1 mmol/L   Chloride 96 (L) 101 - 111 mmol/L   CO2 23 22 - 32 mmol/L   Glucose, Bld 138 (H) 65 - 99 mg/dL   BUN 15 6 - 20 mg/dL   Creatinine, Ser 1.02 0.61 - 1.24 mg/dL   Calcium 9.1 8.9 - 10.3 mg/dL   GFR calc non Af Amer >60 >60 mL/min   GFR calc Af  Amer >60 >60 mL/min    Comment: (NOTE) The eGFR has been calculated using the CKD EPI equation. This calculation has not been validated in all clinical situations. eGFR's persistently <60 mL/min signify possible Chronic Kidney Disease.    Anion gap 12 5 - 15  Body fluid culture     Status: None (Preliminary result)   Collection Time: 07/29/16 11:59 PM  Result Value Ref Range   Specimen Description SYNOVIAL LEFT KNEE    Special Requests NONE    Gram Stain      ABUNDANT WBC PRESENT, PREDOMINANTLY PMN NO ORGANISMS SEEN    Culture PENDING    Report Status PENDING   Lactic acid, plasma     Status: None   Collection Time: 07/30/16 12:30 AM  Result Value Ref Range   Lactic Acid, Venous 0.9 0.5 - 1.9 mmol/L   Dg Knee Complete 4 Views Left  Result Date: 07/29/2016 CLINICAL DATA:  Left knee surgery 2 weeks prior. Fluid build up requiring drainage twice in the last 2 weeks, last drainage yesterday. Increased swelling since that time. EXAM: LEFT KNEE - COMPLETE 4+ VIEW COMPARISON:  None. FINDINGS: Moderate to large suprapatellar joint effusion. No definite internal air. No acute osseous abnormalities. Prior ACL repair, anchors are in appropriate position. Medial tibial femoral joint space narrowing and peripheral spurring. Tiny patellofemoral osteophytes. No bony destructive change. IMPRESSION: Moderate to large suprapatellar joint effusion. No acute osseous abnormality or bony destructive change. Electronically Signed   By: Jeb Levering M.D.   On: 07/29/2016 20:47    Review of Systems  HENT: Negative.   Eyes: Negative.   Respiratory: Positive for cough.   Cardiovascular: Negative.        HTN  Gastrointestinal: Negative.        GERD  Genitourinary: Negative.   Musculoskeletal: Positive for joint pain and myalgias.  Skin: Negative.   Neurological: Negative.   Endo/Heme/Allergies: Negative.     Blood pressure (!) 174/111, pulse 103, temperature 97.9 F (36.6 C), temperature source  Oral, resp. rate 18, SpO2 97 %. Physical Exam  Constitutional: He is oriented to person, place, and time. He appears well-developed and well-nourished.  HENT:  Head: Normocephalic and atraumatic.  Neck: Normal range of motion. Neck supple.  Cardiovascular: Intact distal pulses.   Respiratory: Effort normal.  Musculoskeletal: He exhibits edema and tenderness.  Severe pain with any  motion of the left leg.  Neurological: He is alert and oriented to person, place, and time.  Skin: Skin is warm and dry. There is erythema.  Psychiatric: He has a normal mood and affect. His behavior is normal. Judgment and thought content normal.     Assessment/Plan Assessment:  Septic left knee following arthroscopy July 20th  Plan:  Plan is to admit pt and wash out arthroscopically later this morning with Dr. Rhona Raider.  Continue with IV VANC and IV pain medication dilaudid.  Marylee Belzer R, PA-C 07/30/2016, 6:46 AM

## 2016-07-30 NOTE — ED Notes (Signed)
Attempted report 

## 2016-07-30 NOTE — Anesthesia Procedure Notes (Signed)
Procedure Name: Intubation Date/Time: 07/30/2016 12:54 PM Performed by: Lance Coon Pre-anesthesia Checklist: Patient identified, Suction available, Patient being monitored, Timeout performed and Emergency Drugs available Patient Re-evaluated:Patient Re-evaluated prior to inductionOxygen Delivery Method: Circle system utilized Preoxygenation: Pre-oxygenation with 100% oxygen Intubation Type: IV induction, Rapid sequence and Cricoid Pressure applied Laryngoscope Size: Miller and 2 Grade View: Grade I Tube type: Oral Tube size: 7.5 mm Number of attempts: 1 Airway Equipment and Method: Stylet Placement Confirmation: ETT inserted through vocal cords under direct vision,  positive ETCO2 and breath sounds checked- equal and bilateral Secured at: 22 cm Tube secured with: Tape Dental Injury: Teeth and Oropharynx as per pre-operative assessment

## 2016-07-31 LAB — COMPREHENSIVE METABOLIC PANEL
ALK PHOS: 99 U/L (ref 38–126)
ALT: 72 U/L — ABNORMAL HIGH (ref 17–63)
ANION GAP: 13 (ref 5–15)
AST: 31 U/L (ref 15–41)
Albumin: 2.8 g/dL — ABNORMAL LOW (ref 3.5–5.0)
BILIRUBIN TOTAL: 1.1 mg/dL (ref 0.3–1.2)
BUN: 7 mg/dL (ref 6–20)
CALCIUM: 8.8 mg/dL — AB (ref 8.9–10.3)
CO2: 25 mmol/L (ref 22–32)
Chloride: 97 mmol/L — ABNORMAL LOW (ref 101–111)
Creatinine, Ser: 0.78 mg/dL (ref 0.61–1.24)
GFR calc Af Amer: >60 mL/min (ref >60)
GLUCOSE: 87 mg/dL (ref 65–99)
POTASSIUM: 4 mmol/L (ref 3.5–5.1)
Sodium: 135 mmol/L (ref 135–145)
TOTAL PROTEIN: 6.2 g/dL — AB (ref 6.5–8.1)

## 2016-07-31 LAB — CBC
HEMATOCRIT: 38.7 % — AB (ref 39.0–52.0)
HEMOGLOBIN: 13 g/dL (ref 13.0–17.0)
MCH: 30.7 pg (ref 26.0–34.0)
MCHC: 33.6 g/dL (ref 30.0–36.0)
MCV: 91.5 fL (ref 78.0–100.0)
Platelets: 271 10*3/uL (ref 150–400)
RBC: 4.23 MIL/uL (ref 4.22–5.81)
RDW: 13.4 % (ref 11.5–15.5)
WBC: 11.9 10*3/uL — ABNORMAL HIGH (ref 4.0–10.5)

## 2016-07-31 LAB — HIV ANTIBODY (ROUTINE TESTING W REFLEX): HIV Screen 4th Generation wRfx: NONREACTIVE

## 2016-07-31 LAB — MISC LABCORP TEST (SEND OUT)
LABCORP TEST CODE: 19497
Labcorp test code: 19588

## 2016-07-31 MED ORDER — CYCLOBENZAPRINE HCL 10 MG PO TABS
10.0000 mg | ORAL_TABLET | Freq: Three times a day (TID) | ORAL | Status: DC | PRN
  Administered 2016-07-31 – 2016-08-02 (×6): 10 mg via ORAL
  Filled 2016-07-31 (×6): qty 1

## 2016-07-31 MED ORDER — SODIUM CHLORIDE 0.9% FLUSH: 9.0000 mL | INTRAVENOUS | Status: DC | PRN

## 2016-07-31 MED ORDER — SODIUM CHLORIDE 0.9% FLUSH
10.0000 mL | Freq: Two times a day (BID) | INTRAVENOUS | Status: DC
  Administered 2016-08-01: 10 mL

## 2016-07-31 MED ORDER — SODIUM CHLORIDE 0.9% FLUSH
10.0000 mL | INTRAVENOUS | Status: DC | PRN
  Administered 2016-08-02: 10 mL
  Filled 2016-07-31: qty 40

## 2016-07-31 MED ORDER — HYDROMORPHONE 1 MG/ML IV SOLN
INTRAVENOUS | Status: DC | PRN
  Administered 2016-07-31: 09:00:00 via INTRAVENOUS
  Administered 2016-07-31: 4 mg via INTRAVENOUS
  Administered 2016-08-01 (×2): 3 mg via INTRAVENOUS
  Filled 2016-07-31: qty 25

## 2016-07-31 MED ORDER — ONDANSETRON HCL 4 MG/2ML IJ SOLN: 4.0000 mg | Freq: Four times a day (QID) | INTRAMUSCULAR | Status: DC | PRN

## 2016-07-31 MED ORDER — PNEUMOCOCCAL VAC POLYVALENT 25 MCG/0.5ML IJ INJ
0.5000 mL | INJECTION | INTRAMUSCULAR | Status: AC
  Administered 2016-08-01: 0.5 mL via INTRAMUSCULAR
  Filled 2016-07-31: qty 0.5

## 2016-07-31 MED ORDER — DIPHENHYDRAMINE HCL 12.5 MG/5ML PO ELIX: 12.5000 mg | ORAL_SOLUTION | Freq: Four times a day (QID) | ORAL | Status: DC | PRN

## 2016-07-31 MED ORDER — NALOXONE HCL 0.4 MG/ML IJ SOLN: 0.4000 mg | INTRAMUSCULAR | Status: DC | PRN

## 2016-07-31 MED ORDER — DIPHENHYDRAMINE HCL 50 MG/ML IJ SOLN: 12.5000 mg | Freq: Four times a day (QID) | INTRAMUSCULAR | Status: DC | PRN

## 2016-07-31 NOTE — Progress Notes (Signed)
Peripherally Inserted Central Catheter/Midline Placement  The IV Nurse has discussed with the patient and/or persons authorized to consent for the patient, the purpose of this procedure and the potential benefits and risks involved with this procedure.  The benefits include less needle sticks, lab draws from the catheter,ability to perform a PICC exchange if ordered by the physician and patient may be discharged home with the catheter.  Risks include, but not limited to, infection, bleeding, blood clot (thrombus formation), and puncture of an artery; nerve damage and irregular heat beat.  Alternatives to this procedure were also discussed.  Bard educational information left for pt.  PICC/Midline Placement Documentation  PICC Single Lumen [id number] PICC Right Basilic 37 cm 0 cm (Active)  Indication for Insertion or Continuance of Line Home intravenous therapies (PICC only);Prolonged intravenous therapies 07/31/2016 11:11 AM  Exposed Catheter (cm) 0 cm 07/31/2016 11:11 AM  Site Assessment Clean;Dry;Intact 07/31/2016 11:11 AM  Line Status Flushed;Saline locked;Blood return noted 07/31/2016 11:11 AM  Dressing Type Transparent 07/31/2016 11:11 AM  Dressing Status Clean;Dry;Intact;Antimicrobial disc in place 07/31/2016 11:11 AM  Line Care Connections checked and tightened 07/31/2016 11:11 AM  Line Adjustment (NICU/IV Team Only) No 07/31/2016 11:11 AM  Dressing Intervention New dressing 07/31/2016 11:11 AM  Dressing Change Due 08/07/16 07/31/2016 11:11 AM       Rolena Infante 07/31/2016, 11:12 AM

## 2016-07-31 NOTE — Progress Notes (Signed)
Spoke with Dr. Megan Salon regarding PICC line.  Approval given to place PICC today.

## 2016-07-31 NOTE — Anesthesia Postprocedure Evaluation (Signed)
Anesthesia Post Note  Patient: [name]  Procedure(s) Performed: Procedure(s) (LRB): left knee arthroscopic wash out (Left)  Patient location during evaluation: PACU Anesthesia Type: General Level of consciousness: awake and alert Pain management: pain level controlled Vital Signs Assessment: post-procedure vital signs reviewed and stable Respiratory status: spontaneous breathing, nonlabored ventilation, respiratory function stable and patient connected to nasal cannula oxygen Cardiovascular status: blood pressure returned to baseline and stable Postop Assessment: no signs of nausea or vomiting Anesthetic complications: no    Last Vitals:  Vitals:   07/31/16 0039 07/31/16 0458  BP: (!) 150/90 (!) 148/87  Pulse: (!) 101 96  Resp: 18 18  Temp: 36.8 C 36.6 C    Last Pain:  Vitals:   07/31/16 0901  TempSrc:   PainSc: 10-Worst pain ever                 Catalina Gravel

## 2016-07-31 NOTE — Plan of Care (Signed)
Problem: Pain Managment: Goal: General experience of comfort will improve Outcome: Not Progressing Patient continues to be in severe pain. PA notified this shift. PCA pump initiated so patient can administer his IV pain meds every hour as needed.  PO pills are given for breakthrough pain as ordered. Flexeril added this shift.  Patient has been able to rest but pain is still severe. Nursing will continue to monitor.

## 2016-07-31 NOTE — Progress Notes (Signed)
Subjective: 1 Day Post-Op Procedure(s) (LRB): left knee arthroscopic wash out (Left)   Patietn still in significant pain. Nurse is asking if we can place him on a PCA as it is difficult for them to administer pain meds every hr. He is afebrile this morning and his white count is down. His drain has been emptied once and remains working.   Activity level:  Mostly bed rest and elevation. Diet tolerance:  ok Voiding:  ok Patient reports pain as moderate and severe.    Objective: Vital signs in last 24 hours: Temp:  [97.9 F (36.6 C)-99 F (37.2 C)] 97.9 F (36.6 C) (08/05 0458) Pulse Rate:  [96-108] 96 (08/05 0458) Resp:  [18-20] 18 (08/05 0458) BP: (145-172)/(84-101) 148/87 (08/05 0458) SpO2:  [93 %-100 %] 97 % (08/05 0458)  Labs:  Recent Labs  07/29/16 2016 07/31/16 0602  HGB 15.5 13.0    Recent Labs  07/29/16 2016 07/31/16 0602  WBC 21.1* 11.9*  RBC 4.94 4.23  HCT 45.1 38.7*  PLT 307 271    Recent Labs  07/29/16 2016 07/31/16 0602  NA 131* 135  K 4.0 4.0  CL 96* 97*  CO2 23 25  BUN 15 7  CREATININE 1.02 0.78  GLUCOSE 138* 87  CALCIUM 9.1 8.8*   No results for input(s): LABPT, INR in the last 72 hours.  Physical Exam:  Neurologically intact ABD soft Neurovascular intact Sensation intact distally Intact pulses distally Dorsiflexion/Plantar flexion intact Incision: dressing C/D/I and no drainage No cellulitis present Compartment soft  Assessment/Plan:  1 Day Post-Op Procedure(s) (LRB): left knee arthroscopic wash out (Left) Advance diet  Continue on ABX. We greatly appreciate infectious disease recommendations and management. We will continue to monitor  Him closely. We will place him on a PCA at the same time regimen that he is currently getting his pain meds.   We will DC PRN IV pain meds. Continue to monitor drain output.  Costa Jha, Larwance Sachs 07/31/2016, 8:16 AM

## 2016-07-31 NOTE — Progress Notes (Signed)
Patient ID: [name], male   DOB: 06/12/1961, 55 y.o.   MRN: [id number]          [facility]    Date of Admission:  07/29/2016           Day 2 vancomycin and ceftriaxone  He is having quite a bit of pain in his septic left knee but remains afebrile. He underwent arthroscopic incision and drainage yesterday. Operative Gram stain did not reveal any organisms and cultures have been reintubated for better growth. I will continue current antibiotic therapy pending final cultures. A PICC has been placed.         Michel Bickers, MD [facility] for Infectious [place] [facility] [phone] pager   [phone] cell 12/30/2015, 1:32 PM

## 2016-08-01 LAB — CBC
HEMATOCRIT: 36.8 % — AB (ref 39.0–52.0)
HEMOGLOBIN: 12.7 g/dL — AB (ref 13.0–17.0)
MCH: 31 pg (ref 26.0–34.0)
MCHC: 34.5 g/dL (ref 30.0–36.0)
MCV: 89.8 fL (ref 78.0–100.0)
Platelets: 280 10*3/uL (ref 150–400)
RBC: 4.1 MIL/uL — AB (ref 4.22–5.81)
RDW: 13.2 % (ref 11.5–15.5)
WBC: 10.8 10*3/uL — ABNORMAL HIGH (ref 4.0–10.5)

## 2016-08-01 LAB — COMPREHENSIVE METABOLIC PANEL
ALBUMIN: 2.7 g/dL — AB (ref 3.5–5.0)
ALT: 66 U/L — AB (ref 17–63)
AST: 31 U/L (ref 15–41)
Alkaline Phosphatase: 117 U/L (ref 38–126)
Anion gap: 9 (ref 5–15)
BUN: 8 mg/dL (ref 6–20)
CHLORIDE: 96 mmol/L — AB (ref 101–111)
CO2: 26 mmol/L (ref 22–32)
CREATININE: 0.78 mg/dL (ref 0.61–1.24)
Calcium: 8.7 mg/dL — ABNORMAL LOW (ref 8.9–10.3)
GFR calc Af Amer: >60 mL/min (ref >60)
GFR calc non Af Amer: >60 mL/min (ref >60)
GLUCOSE: 153 mg/dL — AB (ref 65–99)
POTASSIUM: 3.6 mmol/L (ref 3.5–5.1)
SODIUM: 131 mmol/L — AB (ref 135–145)
Total Bilirubin: 0.8 mg/dL (ref 0.3–1.2)
Total Protein: 6.4 g/dL — ABNORMAL LOW (ref 6.5–8.1)

## 2016-08-01 LAB — HEPATITIS C ANTIBODY (REFLEX): HCV Ab: <0.1 s/co ratio (ref 0.0–0.9)

## 2016-08-01 LAB — HCV COMMENT:

## 2016-08-01 MED ORDER — SODIUM CHLORIDE 0.9 % IV BOLUS (SEPSIS)
250.0000 mL | Freq: Once | INTRAVENOUS | Status: AC
  Administered 2016-08-01: 250 mL via INTRAVENOUS

## 2016-08-01 MED ORDER — ZOLPIDEM TARTRATE 5 MG PO TABS: 5.0000 mg | ORAL_TABLET | Freq: Every evening | ORAL | Status: DC | PRN

## 2016-08-01 MED ORDER — HYDROMORPHONE HCL 1 MG/ML IJ SOLN
1.0000 mg | INTRAMUSCULAR | Status: DC | PRN
  Administered 2016-08-01 – 2016-08-02 (×5): 1 mg via INTRAVENOUS
  Filled 2016-08-01 (×5): qty 1

## 2016-08-01 MED ORDER — VANCOMYCIN HCL 10 G IV SOLR
1500.0000 mg | Freq: Two times a day (BID) | INTRAVENOUS | Status: DC
  Administered 2016-08-02: 1500 mg via INTRAVENOUS
  Filled 2016-08-01 (×2): qty 1500

## 2016-08-01 NOTE — Progress Notes (Signed)
Called Bryce Nida Pa-C about patients recheck of bp, Smith stated to encourage fluids and to have patient not drink an stimulants, (patient stated he had Bryce cup of coffee with an extra shot in it before I rechecked his bp)

## 2016-08-01 NOTE — Progress Notes (Signed)
Dilaudid pca discontinued, 2nd RN witnessed the dilaudid syringe was empty

## 2016-08-01 NOTE — Progress Notes (Signed)
Subjective: 2 Days Post-Op Procedure(s) (LRB): left knee arthroscopic wash out (Left)   Patient more comfortable this morning. They emptied the drain again this morning. He has been trying to cut back on the use of the PCA.   Activity level:  Mostly bed/chair rest and elevation. He can get up and move around a little if needed.  Diet tolerance:  ok Voiding:  ok Patient reports pain as moderate.    Objective: Vital signs in last 24 hours: Temp:  [98.1 F (36.7 C)-98.5 F (36.9 C)] 98.5 F (36.9 C) (08/06 0439) Pulse Rate:  [90-106] 98 (08/06 0439) Resp:  [11-16] 13 (08/06 0405) BP: (153-157)/(87-95) 157/95 (08/06 0439) SpO2:  [97 %-99 %] 99 % (08/06 0439)  Labs:  Recent Labs  07/29/16 2016 07/31/16 0602 08/01/16 0104  HGB 15.5 13.0 12.7*    Recent Labs  07/31/16 0602 08/01/16 0104  WBC 11.9* 10.8*  RBC 4.23 4.10*  HCT 38.7* 36.8*  PLT 271 280    Recent Labs  07/31/16 0602 08/01/16 0104  NA 135 131*  K 4.0 3.6  CL 97* 96*  CO2 25 26  BUN 7 8  CREATININE 0.78 0.78  GLUCOSE 87 153*  CALCIUM 8.8* 8.7*   No results for input(s): LABPT, INR in the last 72 hours.  Physical Exam:  Neurologically intact ABD soft Neurovascular intact Sensation intact distally Intact pulses distally Dorsiflexion/Plantar flexion intact Incision: dressing C/D/I and no drainage No cellulitis present Compartment soft  Assessment/Plan:  2 Days Post-Op Procedure(s) (LRB): left knee arthroscopic wash out (Left) Continue on IV ABX. I told him that the plan was to pull the drain tomorrow morning and if no re accumulation and if he is on oral pain meds only then he could go home Tuesday. We would greatly appreciate recommendations of dosing and duration of IV ABX per infectious disease. ' WBC trending in the proper direction and patient is afebrile. We will continue to follow him closely.   Nyhla Mountjoy, Larwance Sachs 08/01/2016, 9:00 AM

## 2016-08-01 NOTE — Progress Notes (Signed)
Patient ID: [name], male   DOB: 17-Apr-1961, 55 y.o.   MRN: [id number]          [facility] for Infectious Disease    Date of Admission:  07/29/2016           Day 3 vancomycin and ceftriaxone  Operative cultures are still pending. I will continue current antibiotic therapy.         Michel Bickers, MD [facility] for Infectious [place] [facility] [phone] pager   [phone] cell 12/30/2015, 1:32 PM

## 2016-08-01 NOTE — Progress Notes (Signed)
pca history cleared patient used 4mg  in 4 hours, respirations 12, heart rate 107, O2 97% on 2L,  etco2 24, pain level 6

## 2016-08-01 NOTE — Progress Notes (Signed)
RN went in patients room to recheck patients blood pressure. Patient upset/angry over the Iv pump peeping and not being able to sleep since he came here. He stated that his blood pressure would still be high because he is pissed off.  RN asked patient if he had called to let anyone know that the pump was beeping. IV  pump had changed over to kvo when vancomycin was suppose to be running. Patient stated he had been pushing the buttons on the iv pump to get it to stop beeping. Rn stated that unless he lets someone know that we may not hear it pumping. And that he needs to let us know so we can fix it. Rn stated that she sorry that he hasn't got in a rest. Patient family had entered the room at this time and patient stated he didn't want to talk about it anymore, He just wanted to talk to his kids.Called A Nida Pa-C about patients situation and bp. New orders recieved

## 2016-08-01 NOTE — Progress Notes (Signed)
Pharmacy Antibiotic Note  [name] is a 55 y.o. male admitted on 07/29/2016 with septic knee.  Pharmacy has been consulted for vanc dosing. Pt recently had a arthroscopy on 7/20. He had and I&D on 8/4. His knee culture has grown out MRSA. He is currently on Vanc/rocephin before the culture came back today. D/w Dr. Megan Salon about Rx to manage vanc.   Plan:  Vanc 1.5g IV q12 for now VT tonight     Temp (24hrs), Avg:98.2 F (36.8 C), Min:98.1 F (36.7 C), Max:98.5 F (36.9 C)   Recent Labs Lab 07/29/16 2016 07/30/16 0030 07/31/16 0602 08/01/16 0104  WBC 21.1*  --  11.9* 10.8*  CREATININE 1.02  --  0.78 0.78  LATICACIDVEN  --  0.9  --   --     CrCl cannot be calculated (Unknown ideal weight.).    Allergies  Allergen Reactions  . Codeine Nausea Only    Antimicrobials this admission: 8/4 Rocephin >>  8/4 Vanc >>   Dose adjustments this admission:   Microbiology results: 8/4 BCx: MRSA UCx:  Sputum:  8/4 MRSA PCR: pos  Onnie Boer, PharmD Pager: [phone] 08/01/2016 2:04 PM

## 2016-08-02 ENCOUNTER — Encounter (HOSPITAL_COMMUNITY): Payer: Self-pay | Admitting: Orthopaedic Surgery

## 2016-08-02 DIAGNOSIS — T8454XA Infection and inflammatory reaction due to internal left knee prosthesis, initial encounter: Secondary | ICD-10-CM | POA: Diagnosis not present

## 2016-08-02 DIAGNOSIS — A4902 Methicillin resistant Staphylococcus aureus infection, unspecified site: Secondary | ICD-10-CM | POA: Diagnosis not present

## 2016-08-02 LAB — BODY FLUID CULTURE

## 2016-08-02 LAB — COMPREHENSIVE METABOLIC PANEL
ALK PHOS: 114 U/L (ref 38–126)
ALT: 54 U/L (ref 17–63)
AST: 22 U/L (ref 15–41)
Albumin: 2.3 g/dL — ABNORMAL LOW (ref 3.5–5.0)
Anion gap: 9 (ref 5–15)
BUN: 7 mg/dL (ref 6–20)
CALCIUM: 8.8 mg/dL — AB (ref 8.9–10.3)
CO2: 28 mmol/L (ref 22–32)
Chloride: 99 mmol/L — ABNORMAL LOW (ref 101–111)
Creatinine, Ser: 0.73 mg/dL (ref 0.61–1.24)
GFR calc Af Amer: >60 mL/min (ref >60)
GLUCOSE: 111 mg/dL — AB (ref 65–99)
POTASSIUM: 3.5 mmol/L (ref 3.5–5.1)
Sodium: 136 mmol/L (ref 135–145)
TOTAL PROTEIN: 5.8 g/dL — AB (ref 6.5–8.1)
Total Bilirubin: 0.9 mg/dL (ref 0.3–1.2)

## 2016-08-02 LAB — CBC
HEMATOCRIT: 34 % — AB (ref 39.0–52.0)
HEMOGLOBIN: 11.7 g/dL — AB (ref 13.0–17.0)
MCH: 30.7 pg (ref 26.0–34.0)
MCHC: 34.4 g/dL (ref 30.0–36.0)
MCV: 89.2 fL (ref 78.0–100.0)
PLATELETS: 308 10*3/uL (ref 150–400)
RBC: 3.81 MIL/uL — AB (ref 4.22–5.81)
RDW: 13.2 % (ref 11.5–15.5)
WBC: 7.6 10*3/uL (ref 4.0–10.5)

## 2016-08-02 LAB — VANCOMYCIN, TROUGH: VANCOMYCIN TR: 8 ug/mL — AB (ref 15–20)

## 2016-08-02 MED ORDER — HEPARIN SOD (PORK) LOCK FLUSH 100 UNIT/ML IV SOLN
250.0000 [IU] | INTRAVENOUS | Status: AC | PRN
  Administered 2016-08-02: 250 [IU]

## 2016-08-02 MED ORDER — VANCOMYCIN HCL IN DEXTROSE 1-5 GM/200ML-% IV SOLN
1000.0000 mg | Freq: Three times a day (TID) | INTRAVENOUS | Status: DC
  Filled 2016-08-02 (×3): qty 200

## 2016-08-02 MED ORDER — HYDROCODONE-ACETAMINOPHEN 10-325 MG PO TABS: 1.0000 | ORAL_TABLET | ORAL | 0 refills | Status: DC | PRN

## 2016-08-02 MED ORDER — VANCOMYCIN HCL 10 G IV SOLR: 1500.0000 mg | Freq: Two times a day (BID) | INTRAVENOUS | 0 refills | Status: DC

## 2016-08-02 MED ORDER — CHLORHEXIDINE GLUCONATE CLOTH 2 % EX PADS
6.0000 | MEDICATED_PAD | Freq: Every day | CUTANEOUS | Status: DC
  Administered 2016-08-02: 6 via TOPICAL

## 2016-08-02 MED ORDER — MUPIROCIN 2 % EX OINT
1.0000 "application " | TOPICAL_OINTMENT | Freq: Two times a day (BID) | CUTANEOUS | Status: DC
  Administered 2016-08-02: 1 via NASAL
  Filled 2016-08-02: qty 22

## 2016-08-02 NOTE — Care Management Note (Signed)
Case Management Note  Patient Details  Name: [name] MRN: [id number] Date of Birth: [birth year]  Subjective/Objective:  55 yr old gentleman s/p I & D of infected left knee with post op infection. Original surgery 07/15/16.   Action/Plan: Case manager spoke with patient and wife concerning Home Health needs. Choice was offered for home health Agency. Patient will go home with PICC line for IV antibiotic infusion for next 4-6 weeks. Referral was called to Carolynn Sayers, RN IV Specialist with [facility]. Patient states he will be taking his family to [facility] as soon as he can get discharged, they had this vacation scheduled prior to hospitalization. CM explained that [facility] will be able to accommodate him with this. Case manager informed Carolynn Sayers of this as well.    Expected Discharge Date:   08/02/16               Expected Discharge Plan:  [place]  In-House Referral:  NA  Discharge planning Services  CM Consult  Post Acute Care Choice:  Home Health Choice offered to:  Patient, Spouse  DME Arranged:  IV pump/equipment DME Agency:  [place] Arranged:  RN [facility] Agency:  [place]  Status of Service:  Completed, signed off  If discussed at [place] of Stay Meetings, dates discussed:    Additional Comments:  Ninfa Meeker, RN 08/02/2016, 10:41 AM

## 2016-08-02 NOTE — Progress Notes (Signed)
Subjective: 3 Days Post-Op Procedure(s) (LRB): left knee arthroscopic wash out (Left)   Patient feeling better this morning. He is ready to get his drain out and hopefully home today.  Activity level:  wbat but mostly rest. Diet tolerance:  ok Voiding:  ok Patient reports pain as mild and moderate.    Objective: Vital signs in last 24 hours: Temp:  [98.1 F (36.7 C)-99.5 F (37.5 C)] 99.5 F (37.5 C) (08/07 0527) Pulse Rate:  [94-107] 94 (08/07 0527) Resp:  [18] 18 (08/07 0527) BP: (144-166)/(89-123) 157/96 (08/07 0527) SpO2:  [97 %-100 %] 97 % (08/07 0527)  Labs:  Recent Labs  07/31/16 0602 08/01/16 0104 08/02/16 0435  HGB 13.0 12.7* 11.7*    Recent Labs  08/01/16 0104 08/02/16 0435  WBC 10.8* 7.6  RBC 4.10* 3.81*  HCT 36.8* 34.0*  PLT 280 308    Recent Labs  08/01/16 0104 08/02/16 0435  NA 131* 136  K 3.6 3.5  CL 96* 99*  CO2 26 28  BUN 8 7  CREATININE 0.78 0.73  GLUCOSE 153* 111*  CALCIUM 8.7* 8.8*   No results for input(s): LABPT, INR in the last 72 hours.  Physical Exam:  Neurologically intact ABD soft Neurovascular intact Sensation intact distally Intact pulses distally Dorsiflexion/Plantar flexion intact Incision: dressing C/D/I and no drainage No cellulitis present Compartment soft  Assessment/Plan:  3 Days Post-Op Procedure(s) (LRB): left knee arthroscopic wash out (Left) Advance diet Discharge home with home health today if ok with infectious disease.  From our standpoint he is clear to go home once ID has made reccomendations for Abx and duration. I removed drain today. We will see him in office 1 week.  Keep new bandage on for 48hrs then ok to remove and shower.  Alexios Keown, Larwance Sachs 08/02/2016, 8:55 AM

## 2016-08-02 NOTE — Progress Notes (Signed)
Pt ready for discharge. Education/instructions reviewed with pt and wife, and all questions/concerns addressed. Belongings gathered and pt will be transported out via wheelchair to wife's car. Will continue to monitor

## 2016-08-02 NOTE — Progress Notes (Signed)
[facility]  Patient Status:  New pt for [facility] this admission  [facility] is providing the following services: HHRN and Home Infusion Pharmacy team to support home IV Vancomycin.  Pt plans to travel to [place] tomorrow a.m. Pt is planned for 1230 PM trough today prior to DC home.  Discussed with Marlowe Shores, PharmD with [facility] regarding best plan to adjust home dosing schedule.  [facility] will review today's trough and adjust accordingly.  If pt does remain on Q 12 hour regimen as expected,  Kaitlin,RN plans to NOT administer today's 1 PM.  [facility] will provide HHRN tonight to his home for teaching and to support first dose at home around 4-6 PM today to allow pt to start home dosing schedule of 8A/8P tomorrow morning.   If patient discharges after hours, please call [phone].   Larry Sierras 08/02/2016, 11:25 AM

## 2016-08-03 DIAGNOSIS — T8454XA Infection and inflammatory reaction due to internal left knee prosthesis, initial encounter: Secondary | ICD-10-CM | POA: Diagnosis not present

## 2016-08-03 DIAGNOSIS — M25561 Pain in right knee: Secondary | ICD-10-CM | POA: Diagnosis not present

## 2016-08-05 ENCOUNTER — Ambulatory Visit
Admission: RE | Admit: 2016-08-05 | Discharge: 2016-08-05 | Disposition: A | Discharge status: Home / Self Care | Payer: BLUE CROSS/BLUE SHIELD | Source: Ambulatory Visit | Attending: Orthopedic Surgery | Admitting: Orthopedic Surgery

## 2016-08-05 ENCOUNTER — Other Ambulatory Visit: Payer: Self-pay | Admitting: Orthopedic Surgery

## 2016-08-05 DIAGNOSIS — M25562 Pain in left knee: Secondary | ICD-10-CM

## 2016-08-05 DIAGNOSIS — M25462 Effusion, left knee: Secondary | ICD-10-CM | POA: Diagnosis not present

## 2016-08-05 DIAGNOSIS — T8454XA Infection and inflammatory reaction due to internal left knee prosthesis, initial encounter: Secondary | ICD-10-CM | POA: Diagnosis not present

## 2016-08-05 MED ORDER — GADOBENATE DIMEGLUMINE 529 MG/ML IV SOLN
18.0000 mL | Freq: Once | INTRAVENOUS | Status: AC | PRN
  Administered 2016-08-05: 18 mL via INTRAVENOUS

## 2016-08-06 ENCOUNTER — Inpatient Hospital Stay (HOSPITAL_COMMUNITY): Payer: BLUE CROSS/BLUE SHIELD | Admitting: Anesthesiology

## 2016-08-06 ENCOUNTER — Other Ambulatory Visit: Payer: Self-pay | Admitting: Orthopedic Surgery

## 2016-08-06 ENCOUNTER — Encounter (HOSPITAL_COMMUNITY): Payer: Self-pay | Admitting: Certified Registered Nurse Anesthetist

## 2016-08-06 ENCOUNTER — Encounter (HOSPITAL_COMMUNITY)
Admission: AD | Disposition: A | Discharge status: Home Health | Payer: Self-pay | Source: Ambulatory Visit | Attending: Orthopaedic Surgery

## 2016-08-06 ENCOUNTER — Inpatient Hospital Stay (HOSPITAL_COMMUNITY)
Admission: AD | Admit: 2016-08-06 | Discharge: 2016-08-09 | DRG: 857 | Disposition: A | Discharge status: Home Health | Payer: BLUE CROSS/BLUE SHIELD | Source: Ambulatory Visit | Attending: Orthopaedic Surgery | Admitting: Orthopaedic Surgery

## 2016-08-06 ENCOUNTER — Other Ambulatory Visit: Payer: Self-pay | Admitting: Orthopaedic Surgery

## 2016-08-06 ENCOUNTER — Ambulatory Visit: Payer: Self-pay | Admitting: Orthopedic Surgery

## 2016-08-06 DIAGNOSIS — G8918 Other acute postprocedural pain: Secondary | ICD-10-CM | POA: Diagnosis not present

## 2016-08-06 DIAGNOSIS — M009 Pyogenic arthritis, unspecified: Secondary | ICD-10-CM | POA: Diagnosis present

## 2016-08-06 DIAGNOSIS — Z87891 Personal history of nicotine dependence: Secondary | ICD-10-CM | POA: Diagnosis not present

## 2016-08-06 DIAGNOSIS — Z885 Allergy status to narcotic agent status: Secondary | ICD-10-CM

## 2016-08-06 DIAGNOSIS — K219 Gastro-esophageal reflux disease without esophagitis: Secondary | ICD-10-CM | POA: Diagnosis present

## 2016-08-06 DIAGNOSIS — I10 Essential (primary) hypertension: Secondary | ICD-10-CM | POA: Diagnosis not present

## 2016-08-06 DIAGNOSIS — M25562 Pain in left knee: Secondary | ICD-10-CM | POA: Diagnosis present

## 2016-08-06 DIAGNOSIS — T8454XA Infection and inflammatory reaction due to internal left knee prosthesis, initial encounter: Secondary | ICD-10-CM | POA: Diagnosis not present

## 2016-08-06 DIAGNOSIS — Z792 Long term (current) use of antibiotics: Secondary | ICD-10-CM | POA: Diagnosis not present

## 2016-08-06 DIAGNOSIS — B9562 Methicillin resistant Staphylococcus aureus infection as the cause of diseases classified elsewhere: Secondary | ICD-10-CM | POA: Diagnosis not present

## 2016-08-06 DIAGNOSIS — J45909 Unspecified asthma, uncomplicated: Secondary | ICD-10-CM | POA: Diagnosis not present

## 2016-08-06 DIAGNOSIS — T814XXA Infection following a procedure, initial encounter: Principal | ICD-10-CM | POA: Diagnosis present

## 2016-08-06 HISTORY — PX: I & D EXTREMITY: SHX5045

## 2016-08-06 SURGERY — IRRIGATION AND DEBRIDEMENT EXTREMITY
Anesthesia: General | Site: Knee | Laterality: Left

## 2016-08-06 MED ORDER — ACETAMINOPHEN 10 MG/ML IV SOLN
INTRAVENOUS | Status: AC
  Administered 2016-08-06: 1000 mg via INTRAVENOUS
  Filled 2016-08-06: qty 100

## 2016-08-06 MED ORDER — POVIDONE-IODINE 10 % EX SWAB: 2.0000 "application " | Freq: Once | CUTANEOUS | Status: DC

## 2016-08-06 MED ORDER — OXYCODONE HCL 5 MG PO TABS: 5.0000 mg | ORAL_TABLET | Freq: Once | ORAL | Status: DC | PRN

## 2016-08-06 MED ORDER — AMLODIPINE BESYLATE 10 MG PO TABS
10.0000 mg | ORAL_TABLET | Freq: Every day | ORAL | Status: DC
  Administered 2016-08-09: 10 mg via ORAL
  Filled 2016-08-06: qty 1

## 2016-08-06 MED ORDER — ACETAMINOPHEN 650 MG RE SUPP: 650.0000 mg | Freq: Four times a day (QID) | RECTAL | Status: DC | PRN

## 2016-08-06 MED ORDER — HYDROCODONE-ACETAMINOPHEN 10-325 MG PO TABS: 1.0000 | ORAL_TABLET | ORAL | Status: DC | PRN

## 2016-08-06 MED ORDER — FENTANYL CITRATE (PF) 250 MCG/5ML IJ SOLN
INTRAMUSCULAR | Status: AC
  Filled 2016-08-06: qty 5

## 2016-08-06 MED ORDER — PROPOFOL 10 MG/ML IV BOLUS
INTRAVENOUS | Status: DC | PRN
  Administered 2016-08-06: 170 mg via INTRAVENOUS

## 2016-08-06 MED ORDER — SODIUM CHLORIDE 0.9 % IR SOLN
Status: DC | PRN
  Administered 2016-08-06 (×2): 3000 mL

## 2016-08-06 MED ORDER — FAMOTIDINE 20 MG PO TABS
20.0000 mg | ORAL_TABLET | Freq: Every day | ORAL | Status: DC
  Administered 2016-08-09: 20 mg via ORAL
  Filled 2016-08-06: qty 1

## 2016-08-06 MED ORDER — HYDROMORPHONE HCL 1 MG/ML IJ SOLN
INTRAMUSCULAR | Status: AC
  Administered 2016-08-06: 0.5 mg via INTRAVENOUS
  Filled 2016-08-06: qty 1

## 2016-08-06 MED ORDER — LACTATED RINGERS IV SOLN
INTRAVENOUS | Status: DC
Start: 2016-08-06 — End: 2016-08-09

## 2016-08-06 MED ORDER — HYDROMORPHONE HCL 1 MG/ML IJ SOLN
1.0000 mg | INTRAMUSCULAR | Status: DC | PRN
  Administered 2016-08-07 (×4): 1 mg via INTRAVENOUS
  Filled 2016-08-06 (×4): qty 1

## 2016-08-06 MED ORDER — METOCLOPRAMIDE HCL 5 MG PO TABS: 5.0000 mg | ORAL_TABLET | Freq: Three times a day (TID) | ORAL | Status: DC | PRN

## 2016-08-06 MED ORDER — ONDANSETRON HCL 4 MG/2ML IJ SOLN
INTRAMUSCULAR | Status: DC | PRN
  Administered 2016-08-06: 4 mg via INTRAVENOUS

## 2016-08-06 MED ORDER — CHLORHEXIDINE GLUCONATE 4 % EX LIQD: 60.0000 mL | Freq: Once | CUTANEOUS | Status: DC

## 2016-08-06 MED ORDER — GENTAMICIN SULFATE 40 MG/ML IJ SOLN
INTRAMUSCULAR | Status: AC
  Filled 2016-08-06: qty 6

## 2016-08-06 MED ORDER — 0.9 % SODIUM CHLORIDE (POUR BTL) OPTIME
TOPICAL | Status: DC | PRN
  Administered 2016-08-06: 1000 mL

## 2016-08-06 MED ORDER — KETOROLAC TROMETHAMINE 30 MG/ML IJ SOLN
INTRAMUSCULAR | Status: AC
  Administered 2016-08-06: 30 mg via INTRAVENOUS
  Filled 2016-08-06: qty 1

## 2016-08-06 MED ORDER — BUPIVACAINE-EPINEPHRINE (PF) 0.5% -1:200000 IJ SOLN
INTRAMUSCULAR | Status: DC | PRN
  Administered 2016-08-06: 30 mL via PERINEURAL

## 2016-08-06 MED ORDER — MEPERIDINE HCL 25 MG/ML IJ SOLN: 6.2500 mg | INTRAMUSCULAR | Status: DC | PRN

## 2016-08-06 MED ORDER — FLUTICASONE PROPIONATE 50 MCG/ACT NA SUSP
2.0000 | Freq: Every day | NASAL | Status: DC | PRN
  Filled 2016-08-06: qty 16

## 2016-08-06 MED ORDER — FENTANYL CITRATE (PF) 100 MCG/2ML IJ SOLN
INTRAMUSCULAR | Status: DC | PRN
  Administered 2016-08-06 (×5): 50 ug via INTRAVENOUS

## 2016-08-06 MED ORDER — LACTATED RINGERS IV SOLN: INTRAVENOUS | Status: DC

## 2016-08-06 MED ORDER — HYDROMORPHONE HCL 1 MG/ML IJ SOLN
INTRAMUSCULAR | Status: AC
  Filled 2016-08-06: qty 1

## 2016-08-06 MED ORDER — MIDAZOLAM HCL 5 MG/5ML IJ SOLN
INTRAMUSCULAR | Status: DC | PRN
  Administered 2016-08-06: 2 mg via INTRAVENOUS

## 2016-08-06 MED ORDER — LACTATED RINGERS IV SOLN
INTRAVENOUS | Status: DC
  Administered 2016-08-06: 15:00:00 via INTRAVENOUS

## 2016-08-06 MED ORDER — CALCIUM CARBONATE ANTACID 500 MG PO CHEW: 1.0000 | CHEWABLE_TABLET | ORAL | Status: DC | PRN

## 2016-08-06 MED ORDER — DIPHENHYDRAMINE HCL 12.5 MG/5ML PO ELIX: 12.5000 mg | ORAL_SOLUTION | ORAL | Status: DC | PRN

## 2016-08-06 MED ORDER — LIDOCAINE HCL (CARDIAC) 20 MG/ML IV SOLN
INTRAVENOUS | Status: DC | PRN
  Administered 2016-08-06: 100 mg via INTRAVENOUS

## 2016-08-06 MED ORDER — HYDROMORPHONE HCL 1 MG/ML IJ SOLN
INTRAMUSCULAR | Status: DC | PRN
  Administered 2016-08-06: .25 mg via INTRAVENOUS
  Administered 2016-08-06: 0.5 mg via INTRAVENOUS
  Administered 2016-08-06: .25 mg via INTRAVENOUS

## 2016-08-06 MED ORDER — LISINOPRIL 20 MG PO TABS
20.0000 mg | ORAL_TABLET | Freq: Every day | ORAL | Status: DC
  Administered 2016-08-09: 20 mg via ORAL
  Filled 2016-08-06: qty 1

## 2016-08-06 MED ORDER — ZOLPIDEM TARTRATE 5 MG PO TABS: 5.0000 mg | ORAL_TABLET | Freq: Every evening | ORAL | Status: DC | PRN

## 2016-08-06 MED ORDER — ONDANSETRON HCL 4 MG/2ML IJ SOLN: 4.0000 mg | Freq: Four times a day (QID) | INTRAMUSCULAR | Status: DC | PRN

## 2016-08-06 MED ORDER — KETOROLAC TROMETHAMINE 30 MG/ML IJ SOLN
30.0000 mg | Freq: Once | INTRAMUSCULAR | Status: AC
  Administered 2016-08-06: 30 mg via INTRAVENOUS

## 2016-08-06 MED ORDER — ONDANSETRON HCL 4 MG PO TABS: 4.0000 mg | ORAL_TABLET | Freq: Four times a day (QID) | ORAL | Status: DC | PRN

## 2016-08-06 MED ORDER — HYDROMORPHONE HCL 1 MG/ML IJ SOLN
0.2500 mg | INTRAMUSCULAR | Status: DC | PRN
  Administered 2016-08-06 (×4): 0.5 mg via INTRAVENOUS

## 2016-08-06 MED ORDER — AMPHETAMINE-DEXTROAMPHETAMINE 10 MG PO TABS: 30.0000 mg | ORAL_TABLET | Freq: Every day | ORAL | Status: DC | PRN

## 2016-08-06 MED ORDER — VANCOMYCIN HCL 1000 MG IV SOLR
INTRAVENOUS | Status: AC
  Filled 2016-08-06: qty 1000

## 2016-08-06 MED ORDER — ACETAMINOPHEN 325 MG PO TABS: 650.0000 mg | ORAL_TABLET | Freq: Four times a day (QID) | ORAL | Status: DC | PRN

## 2016-08-06 MED ORDER — MIDAZOLAM HCL 2 MG/2ML IJ SOLN
INTRAMUSCULAR | Status: AC
  Filled 2016-08-06: qty 2

## 2016-08-06 MED ORDER — CYCLOBENZAPRINE HCL 10 MG PO TABS
10.0000 mg | ORAL_TABLET | Freq: Three times a day (TID) | ORAL | Status: DC | PRN
  Administered 2016-08-07 – 2016-08-08 (×4): 10 mg via ORAL
  Filled 2016-08-06 (×5): qty 1

## 2016-08-06 MED ORDER — VANCOMYCIN HCL 10 G IV SOLR
1500.0000 mg | Freq: Two times a day (BID) | INTRAVENOUS | Status: DC
  Administered 2016-08-07 (×2): 1500 mg via INTRAVENOUS
  Filled 2016-08-06 (×4): qty 1500

## 2016-08-06 MED ORDER — OXYCODONE HCL 5 MG/5ML PO SOLN: 5.0000 mg | Freq: Once | ORAL | Status: DC | PRN

## 2016-08-06 MED ORDER — METHOCARBAMOL 500 MG PO TABS: 500.0000 mg | ORAL_TABLET | Freq: Four times a day (QID) | ORAL | Status: DC | PRN

## 2016-08-06 MED ORDER — METOCLOPRAMIDE HCL 5 MG/ML IJ SOLN: 5.0000 mg | Freq: Three times a day (TID) | INTRAMUSCULAR | Status: DC | PRN

## 2016-08-06 MED ORDER — ALBUTEROL SULFATE (2.5 MG/3ML) 0.083% IN NEBU: 3.0000 mL | INHALATION_SOLUTION | RESPIRATORY_TRACT | Status: DC | PRN

## 2016-08-06 MED ORDER — ONDANSETRON HCL 4 MG/2ML IJ SOLN: 4.0000 mg | Freq: Once | INTRAMUSCULAR | Status: DC | PRN

## 2016-08-06 MED ORDER — GENTAMICIN SULFATE 40 MG/ML IJ SOLN
INTRAMUSCULAR | Status: DC | PRN
  Administered 2016-08-06: 240 mg via INTRAMUSCULAR

## 2016-08-06 MED ORDER — ACETAMINOPHEN 10 MG/ML IV SOLN
1000.0000 mg | Freq: Once | INTRAVENOUS | Status: AC
  Administered 2016-08-06: 1000 mg via INTRAVENOUS

## 2016-08-06 MED ORDER — DOCUSATE SODIUM 100 MG PO CAPS
100.0000 mg | ORAL_CAPSULE | Freq: Two times a day (BID) | ORAL | Status: DC
Start: 2016-08-06 — End: 2016-08-09
  Administered 2016-08-06 – 2016-08-09 (×5): 100 mg via ORAL
  Filled 2016-08-06 (×6): qty 1

## 2016-08-06 MED ORDER — BISACODYL 5 MG PO TBEC: 5.0000 mg | DELAYED_RELEASE_TABLET | Freq: Every day | ORAL | Status: DC | PRN

## 2016-08-06 MED ORDER — ONDANSETRON HCL 4 MG/2ML IJ SOLN
INTRAMUSCULAR | Status: AC
  Filled 2016-08-06: qty 2

## 2016-08-06 MED ORDER — VANCOMYCIN HCL 1000 MG IV SOLR
INTRAVENOUS | Status: DC | PRN
  Administered 2016-08-06: 1000 mg

## 2016-08-06 MED ORDER — METHOCARBAMOL 1000 MG/10ML IJ SOLN: 500.0000 mg | Freq: Four times a day (QID) | INTRAMUSCULAR | Status: DC | PRN

## 2016-08-06 SURGICAL SUPPLY — 56 items
BANDAGE ELASTIC 4 VELCRO ST LF (GAUZE/BANDAGES/DRESSINGS) ×2 IMPLANT
BANDAGE ELASTIC 6 VELCRO ST LF (GAUZE/BANDAGES/DRESSINGS) ×2 IMPLANT
BANDAGE ESMARK 6X9 LF (GAUZE/BANDAGES/DRESSINGS) ×1 IMPLANT
BLADE SURG 10 STRL SS (BLADE) ×3 IMPLANT
BNDG CMPR 9X6 STRL LF SNTH (GAUZE/BANDAGES/DRESSINGS) ×1
BNDG CMPR MED 10X6 ELC LF (GAUZE/BANDAGES/DRESSINGS) ×1
BNDG COHESIVE 4X5 TAN STRL (GAUZE/BANDAGES/DRESSINGS) ×2 IMPLANT
BNDG COHESIVE 6X5 TAN STRL LF (GAUZE/BANDAGES/DRESSINGS) ×2 IMPLANT
BNDG ELASTIC 6X10 VLCR STRL LF (GAUZE/BANDAGES/DRESSINGS) ×1 IMPLANT
BNDG ESMARK 6X9 LF (GAUZE/BANDAGES/DRESSINGS) ×2
BNDG GAUZE ELAST 4 BULKY (GAUZE/BANDAGES/DRESSINGS) ×4 IMPLANT
COVER SURGICAL LIGHT HANDLE (MISCELLANEOUS) ×2 IMPLANT
CUFF TOURNIQUET SINGLE 34IN LL (TOURNIQUET CUFF) IMPLANT
CUFF TOURNIQUET SINGLE 44IN (TOURNIQUET CUFF) IMPLANT
DRSG ADAPTIC 3X8 NADH LF (GAUZE/BANDAGES/DRESSINGS) ×1 IMPLANT
DRSG PAD ABDOMINAL 8X10 ST (GAUZE/BANDAGES/DRESSINGS) ×2 IMPLANT
DURAPREP 26ML APPLICATOR (WOUND CARE) ×2 IMPLANT
ELECT REM PT RETURN 9FT ADLT (ELECTROSURGICAL)
ELECTRODE REM PT RTRN 9FT ADLT (ELECTROSURGICAL) IMPLANT
EVACUATOR 1/8 PVC DRAIN (DRAIN) IMPLANT
FACESHIELD WRAPAROUND (MASK) ×4 IMPLANT
FACESHIELD WRAPAROUND OR TEAM (MASK) ×2 IMPLANT
GAUZE SPONGE 4X4 12PLY STRL (GAUZE/BANDAGES/DRESSINGS) ×2 IMPLANT
GAUZE XEROFORM 1X8 LF (GAUZE/BANDAGES/DRESSINGS) ×2 IMPLANT
GLOVE BIO SURGEON STRL SZ8 (GLOVE) ×8 IMPLANT
GOWN STRL REUS W/ TWL LRG LVL3 (GOWN DISPOSABLE) ×3 IMPLANT
GOWN STRL REUS W/TWL 2XL LVL3 (GOWN DISPOSABLE) ×2 IMPLANT
GOWN STRL REUS W/TWL LRG LVL3 (GOWN DISPOSABLE) ×6
HANDPIECE INTERPULSE COAX TIP (DISPOSABLE) ×2
KIT BASIN OR (CUSTOM PROCEDURE TRAY) ×2 IMPLANT
KIT ROOM TURNOVER OR (KITS) ×2 IMPLANT
KIT STIMULAN RAPID CURE  10CC (Orthopedic Implant) ×1 IMPLANT
KIT STIMULAN RAPID CURE 10CC (Orthopedic Implant) IMPLANT
MANIFOLD NEPTUNE II (INSTRUMENTS) ×2 IMPLANT
NS IRRIG 1000ML POUR BTL (IV SOLUTION) ×2 IMPLANT
PACK ORTHO EXTREMITY (CUSTOM PROCEDURE TRAY) ×2 IMPLANT
PAD ARMBOARD 7.5X6 YLW CONV (MISCELLANEOUS) ×4 IMPLANT
PENCIL BUTTON HOLSTER BLD 10FT (ELECTRODE) IMPLANT
SET HNDPC FAN SPRY TIP SCT (DISPOSABLE) ×1 IMPLANT
SPONGE GAUZE 4X4 12PLY STER LF (GAUZE/BANDAGES/DRESSINGS) ×2 IMPLANT
SPONGE LAP 18X18 X RAY DECT (DISPOSABLE) ×3 IMPLANT
STAPLER VISISTAT 35W (STAPLE) ×1 IMPLANT
STOCKINETTE IMPERVIOUS 9X36 MD (GAUZE/BANDAGES/DRESSINGS) ×2 IMPLANT
SUT ETHILON 3 0 PS 1 (SUTURE) IMPLANT
SUT VIC AB 0 CT1 27 (SUTURE) ×2
SUT VIC AB 0 CT1 27XBRD ANBCTR (SUTURE) IMPLANT
SUT VIC AB 2-0 CT1 27 (SUTURE) ×4
SUT VIC AB 2-0 CT1 TAPERPNT 27 (SUTURE) ×2 IMPLANT
SUT VLOC 180 0 24IN GS25 (SUTURE) ×1 IMPLANT
TOWEL OR 17X24 6PK STRL BLUE (TOWEL DISPOSABLE) ×2 IMPLANT
TOWEL OR 17X26 10 PK STRL BLUE (TOWEL DISPOSABLE) ×2 IMPLANT
TUBE ANAEROBIC SPECIMEN COL (MISCELLANEOUS) IMPLANT
TUBE CONNECTING 12X1/4 (SUCTIONS) ×2 IMPLANT
UNDERPAD 30X30 INCONTINENT (UNDERPADS AND DIAPERS) ×2 IMPLANT
WATER STERILE IRR 1000ML POUR (IV SOLUTION) ×2 IMPLANT
YANKAUER SUCT BULB TIP NO VENT (SUCTIONS) ×2 IMPLANT

## 2016-08-06 NOTE — Progress Notes (Signed)
[facility]  Patient Status: Active pt with [facility] prior to this admission  [facility] is providing the following services: HHRN and Home Infusion Pharmacy team for home IV ABX. [facility] hospital team will follow pt until DC to support transition home.   If patient discharges after hours, please call [phone].   Larry Sierras 08/06/2016, 1:42 PM

## 2016-08-06 NOTE — Anesthesia Procedure Notes (Addendum)
Procedure Name: LMA Insertion Date/Time: 08/06/2016 3:44 PM Performed by: Willeen Cass P Pre-anesthesia Checklist: Patient identified, Emergency Drugs available, Suction available, Patient being monitored and Timeout performed Patient Re-evaluated:Patient Re-evaluated prior to inductionOxygen Delivery Method: Circle system utilized Preoxygenation: Pre-oxygenation with 100% oxygen Intubation Type: IV induction Ventilation: Mask ventilation without difficulty LMA: LMA inserted LMA Size: 4.0 Number of attempts: 1 Placement Confirmation: positive ETCO2 and breath sounds checked- equal and bilateral Tube secured with: Tape Dental Injury: Teeth and Oropharynx as per pre-operative assessment

## 2016-08-06 NOTE — Interval H&P Note (Signed)
History and Physical Interval Note:  08/06/2016 2:18 PM  [name]  has presented today for surgery, with the diagnosis of Left Knee Infection  The various methods of treatment have been discussed with the patient and family. After consideration of risks, benefits and other options for treatment, the patient has consented to  Procedure(s): IRRIGATION AND DEBRIDEMENT EXTREMITY W/ SYNOVECTOMY (Left) as a surgical intervention .  The patient's history has been reviewed, patient examined, no change in status, stable for surgery.  I have reviewed the patient's chart and labs.  Questions were answered to the patient's satisfaction.     Calub Tarnow G

## 2016-08-06 NOTE — H&P (View-Only) (Signed)
[name] is an 55 y.o. male.   Chief Complaint: Left knee pain HPI: [name] has continue with significant left knee pain.He continues with an effusion and pain despite IV antibiotics and prior arthroscopic washout of his knee.  He has undergone an MRI scan since last visit.  He still continues a significant pain.  MRI: An MRI scan performed on 08/05/16 demonstrates a large knee effusion worrisome for septic joint.  No osteomyelitis or abscesses are noted.  Past Medical History:  Diagnosis Date  . Allergy   . Asthma    bronchitis  . Colon polyps   . GERD (gastroesophageal reflux disease)   . Hypertension   . Pleurisy    hx-12/13  . Rectal fissure   . Seasonal allergies   . Skin cancer     Past Surgical History:  Procedure Laterality Date  . APPENDECTOMY  2006   lap append  . CHOLECYSTECTOMY    . [place]  2005  . I&D EXTREMITY Left 07/30/2016   Procedure: left knee arthroscopic wash out;  Surgeon: Melrose Nakayama, MD;  Location: [place];  Service: Orthopedics;  Laterality: Left;  . KNEE ARTHROSCOPY  2009   left  . KNEE ARTHROSCOPY Left 06/12/2013   Procedure: LEFT KNEE ARTHROSCOPY PARTIAL MEDIAL MENISECTOMY WITH CHONDROPLASTY;  Surgeon: Hessie Dibble, MD;  Location: [place];  Service: Orthopedics;  Laterality: Left;  . KNEE SURGERY  2005   lt acl  . NASAL FRACTURE SURGERY  2012   closed reduction   . SHOULDER SURGERY  2010   left    Family History  Problem Relation Age of Onset  . Heart attack Mother   . Skin cancer Mother   . Hypertension Mother   . Hyperlipidemia Mother   . Stroke Mother   . Heart attack Father   . Bipolar disorder Maternal Grandmother   . Arthritis Maternal Grandmother   . Hyperlipidemia Maternal Grandmother   . Hypertension Maternal Grandmother   . Arthritis Maternal Grandfather   . Hyperlipidemia Maternal Grandfather   . Stroke Maternal Grandfather   . Hypertension Maternal Grandfather   . Arthritis Paternal  Grandmother   . Hypertension Paternal Grandmother   . Arthritis Paternal Grandfather   . Hypertension Paternal Grandfather   . Hyperlipidemia Brother    Social History:  reports that he quit smoking about 31 years ago. His smoking use included Cigarettes and Cigars. He has a 1.00 pack-year smoking history. He has never used smokeless tobacco. He reports that he drinks alcohol. He reports that he uses drugs, including Marijuana, about 2 times per week.  Allergies:  Allergies  Allergen Reactions  . Codeine Nausea Only     (Not in a hospital admission)  No results found for this or any previous visit (from the past 48 hour(s)). Mr Knee Left W Wo Contrast  Result Date: 08/05/2016 CLINICAL DATA:  Knee pain and fever.  Muscle spasms. EXAM: MRI OF THE LEFT KNEE WITHOUT AND WITH CONTRAST TECHNIQUE: Multiplanar, multisequence MR imaging of the knee was performed before and after the administration of intravenous contrast. CONTRAST:  47mL MULTIHANCE GADOBENATE DIMEGLUMINE 529 MG/ML IV SOLN COMPARISON:  07/29/2016 FINDINGS: Despite efforts by the technologist and patient, motion artifact is present on today's exam and could not be eliminated. This reduces exam sensitivity and specificity. MENISCI Medial meniscus: Much of the medial meniscus is diminutive. There seems to be a flap of tissue medial to the PCL for example on image 6/5, possibly a meniscal  flap. Lateral meniscus: Grade 3 horizontal signal in the midbody lateral meniscus image 10/4. LIGAMENTS Cruciates:  The ACL graft is intact.  PCL intact. Collaterals:  Proximal popliteus tendinopathy. CARTILAGE Patellofemoral: Prominent degenerative chondral thinning. Marginal spurring in the patella. Medial: Prominent degenerative chondral thinning. Mild marginal spurring. Small degenerative subcortical foci of edema especially along the medial femoral condyle. Lateral: Prominent degenerative chondral thinning. Marginal spurring. Joint: Large knee effusion  with thickened enhancing synovium for example image 13/10. Amount of fluid in the joint approximately 80 cc. Popliteal Fossa: Small Baker's cyst. Mild distal semimembranosus tendinopathy. Extensor Mechanism: Prepatellar edema also tracks along the retinacula. Bones: Low-level edema and mild marrow enhancement in the lateral femoral condyle. Other: No supplemental non-categorized findings. IMPRESSION: 1. Large knee effusion (approximately 80 cc) with considerable synovial thickening and enhancement. Given the patient's recent knee surgery, this raises concern for the possibility of septic knee joint, and arthrocentesis or drainage may be warranted. 2. Small amount of edema and enhancement in the lateral femoral condyle, could well be reactive, not considered specific for osteomyelitis at this point, but may warrant monitoring. 3. Diminutive medial meniscus, questionable flap of tissue medial to the PCL could be a meniscal flap but is technically nonspecific. 4. Grade 3 horizontal signal in the midbody lateral meniscus, could be from tear or could be postoperative. 5. Proximal popliteus tendinopathy. 6. The ACL graft is intact. 7. Prominent degenerative chondral thinning in all 3 compartments of the knee. 8. Small Baker's cyst.  Mild distal semimembranosus tendinopathy. 9. Prepatellar edema. Electronically Signed   By: Van Clines M.D.   On: 08/05/2016 16:15    Review of Systems  Musculoskeletal: Positive for joint pain.       Left knee    There were no vitals taken for this visit. Physical Exam  Constitutional: He is oriented to person, place, and time. He appears well-developed and well-nourished.  HENT:  Head: Normocephalic and atraumatic.  Eyes: Pupils are equal, round, and reactive to light.  Neck: Normal range of motion.  Cardiovascular: Normal rate and regular rhythm.   Respiratory: Effort normal.  GI: Soft.  Musculoskeletal:  Examination left knee shows large effusion.  Limited range  of motion due to significant pain.  Calf is soft and nontender.  He is neurovascularly intact distally.  Neurological: He is alert and oriented to person, place, and time.  Skin: Skin is warm and dry.  Psychiatric: He has a normal mood and affect. His behavior is normal. Judgment and thought content normal.     Assessment/Plan Septic left knee  After discussing with the patient since she has failed IV antibiotics and prior arthroscopic washout we elected to set him up for surgery for an open washout and synovectomy.  We discussed the risks and benefits of surgery including possible infection and anesthesia. He is in agreement with continuing with surgery.  We will get this set up shortly.  Sydelle Sherfield, Larwance Sachs, PA-C 08/06/2016, 1:10 PM

## 2016-08-06 NOTE — Anesthesia Preprocedure Evaluation (Signed)
Anesthesia Evaluation  Patient identified by MRN, date of birth, ID band Patient awake    Reviewed: Allergy & Precautions, NPO status , Patient's Chart, lab work & pertinent test results  Airway Mallampati: I  TM Distance: >3 FB Neck ROM: Full    Dental  (+) Teeth Intact, Dental Advisory Given   Pulmonary asthma , former smoker,    breath sounds clear to auscultation       Cardiovascular hypertension, Pt. on medications  Rhythm:Regular Rate:Normal     Neuro/Psych    GI/Hepatic GERD  Medicated and Controlled,  Endo/Other    Renal/GU      Musculoskeletal   Abdominal   Peds  Hematology   Anesthesia Other Findings   Reproductive/Obstetrics                             Anesthesia Physical Anesthesia Plan  ASA: II  Anesthesia Plan: General   Post-op Pain Management:    Induction: Intravenous  Airway Management Planned: LMA  Additional Equipment:   Intra-op Plan:   Post-operative Plan: Extubation in OR  Informed Consent: I have reviewed the patients History and Physical, chart, labs and discussed the procedure including the risks, benefits and alternatives for the proposed anesthesia with the patient or authorized representative who has indicated his/her understanding and acceptance.   Dental advisory given  Plan Discussed with: CRNA, Anesthesiologist and Surgeon  Anesthesia Plan Comments:         Anesthesia Quick Evaluation

## 2016-08-06 NOTE — H&P (Signed)
[name] is an 56 y.o. male.   Chief Complaint: Left knee pain HPI: [name] has continue with significant left knee pain.He continues with an effusion and pain despite IV antibiotics and prior arthroscopic washout of his knee.  He has undergone an MRI scan since last visit.  He still continues a significant pain.  MRI: An MRI scan performed on 08/05/16 demonstrates a large knee effusion worrisome for septic joint.  No osteomyelitis or abscesses are noted.  Past Medical History:  Diagnosis Date  . Allergy   . Asthma    bronchitis  . Colon polyps   . GERD (gastroesophageal reflux disease)   . Hypertension   . Pleurisy    hx-12/13  . Rectal fissure   . Seasonal allergies   . Skin cancer     Past Surgical History:  Procedure Laterality Date  . APPENDECTOMY  2006   lap append  . CHOLECYSTECTOMY    . [place]  2005  . I&D EXTREMITY Left 07/30/2016   Procedure: left knee arthroscopic wash out;  Surgeon: Melrose Nakayama, MD;  Location: [facility];  Service: Orthopedics;  Laterality: Left;  . KNEE ARTHROSCOPY  2009   left  . KNEE ARTHROSCOPY Left 06/12/2013   Procedure: LEFT KNEE ARTHROSCOPY PARTIAL MEDIAL MENISECTOMY WITH CHONDROPLASTY;  Surgeon: Hessie Dibble, MD;  Location: [place];  Service: Orthopedics;  Laterality: Left;  . KNEE SURGERY  2005   lt acl  . NASAL FRACTURE SURGERY  2012   closed reduction   . SHOULDER SURGERY  2010   left    Family History  Problem Relation Age of Onset  . Heart attack Mother   . Skin cancer Mother   . Hypertension Mother   . Hyperlipidemia Mother   . Stroke Mother   . Heart attack Father   . Bipolar disorder Maternal Grandmother   . Arthritis Maternal Grandmother   . Hyperlipidemia Maternal Grandmother   . Hypertension Maternal Grandmother   . Arthritis Maternal Grandfather   . Hyperlipidemia Maternal Grandfather   . Stroke Maternal Grandfather   . Hypertension Maternal Grandfather   . Arthritis Paternal  Grandmother   . Hypertension Paternal Grandmother   . Arthritis Paternal Grandfather   . Hypertension Paternal Grandfather   . Hyperlipidemia Brother    Social History:  reports that he quit smoking about 31 years ago. His smoking use included Cigarettes and Cigars. He has a 1.00 pack-year smoking history. He has never used smokeless tobacco. He reports that he drinks alcohol. He reports that he uses drugs, including Marijuana, about 2 times per week.  Allergies:  Allergies  Allergen Reactions  . Codeine Nausea Only     (Not in a hospital admission)  No results found for this or any previous visit (from the past 48 hour(s)). Mr Knee Left W Wo Contrast  Result Date: 08/05/2016 CLINICAL DATA:  Knee pain and fever.  Muscle spasms. EXAM: MRI OF THE LEFT KNEE WITHOUT AND WITH CONTRAST TECHNIQUE: Multiplanar, multisequence MR imaging of the knee was performed before and after the administration of intravenous contrast. CONTRAST:  36mL MULTIHANCE GADOBENATE DIMEGLUMINE 529 MG/ML IV SOLN COMPARISON:  07/29/2016 FINDINGS: Despite efforts by the technologist and patient, motion artifact is present on today's exam and could not be eliminated. This reduces exam sensitivity and specificity. MENISCI Medial meniscus: Much of the medial meniscus is diminutive. There seems to be a flap of tissue medial to the PCL for example on image 6/5, possibly a meniscal  flap. Lateral meniscus: Grade 3 horizontal signal in the midbody lateral meniscus image 10/4. LIGAMENTS Cruciates:  The ACL graft is intact.  PCL intact. Collaterals:  Proximal popliteus tendinopathy. CARTILAGE Patellofemoral: Prominent degenerative chondral thinning. Marginal spurring in the patella. Medial: Prominent degenerative chondral thinning. Mild marginal spurring. Small degenerative subcortical foci of edema especially along the medial femoral condyle. Lateral: Prominent degenerative chondral thinning. Marginal spurring. Joint: Large knee effusion  with thickened enhancing synovium for example image 13/10. Amount of fluid in the joint approximately 80 cc. Popliteal Fossa: Small Baker's cyst. Mild distal semimembranosus tendinopathy. Extensor Mechanism: Prepatellar edema also tracks along the retinacula. Bones: Low-level edema and mild marrow enhancement in the lateral femoral condyle. Other: No supplemental non-categorized findings. IMPRESSION: 1. Large knee effusion (approximately 80 cc) with considerable synovial thickening and enhancement. Given the patient's recent knee surgery, this raises concern for the possibility of septic knee joint, and arthrocentesis or drainage may be warranted. 2. Small amount of edema and enhancement in the lateral femoral condyle, could well be reactive, not considered specific for osteomyelitis at this point, but may warrant monitoring. 3. Diminutive medial meniscus, questionable flap of tissue medial to the PCL could be a meniscal flap but is technically nonspecific. 4. Grade 3 horizontal signal in the midbody lateral meniscus, could be from tear or could be postoperative. 5. Proximal popliteus tendinopathy. 6. The ACL graft is intact. 7. Prominent degenerative chondral thinning in all 3 compartments of the knee. 8. Small Baker's cyst.  Mild distal semimembranosus tendinopathy. 9. Prepatellar edema. Electronically Signed   By: Van Clines M.D.   On: 08/05/2016 16:15    Review of Systems  Musculoskeletal: Positive for joint pain.       Left knee    There were no vitals taken for this visit. Physical Exam  Constitutional: He is oriented to person, place, and time. He appears well-developed and well-nourished.  HENT:  Head: Normocephalic and atraumatic.  Eyes: Pupils are equal, round, and reactive to light.  Neck: Normal range of motion.  Cardiovascular: Normal rate and regular rhythm.   Respiratory: Effort normal.  GI: Soft.  Musculoskeletal:  Examination left knee shows large effusion.  Limited range  of motion due to significant pain.  Calf is soft and nontender.  He is neurovascularly intact distally.  Neurological: He is alert and oriented to person, place, and time.  Skin: Skin is warm and dry.  Psychiatric: He has a normal mood and affect. His behavior is normal. Judgment and thought content normal.     Assessment/Plan Septic left knee  After discussing with the patient since she has failed IV antibiotics and prior arthroscopic washout we elected to set him up for surgery for an open washout and synovectomy.  We discussed the risks and benefits of surgery including possible infection and anesthesia. He is in agreement with continuing with surgery.  We will get this set up shortly.  Jilleen Essner, Larwance Sachs, PA-C 08/06/2016, 1:10 PM

## 2016-08-06 NOTE — Transfer of Care (Signed)
Immediate Anesthesia Transfer of Care Note  Patient: [name]  Procedure(s) Performed: Procedure(s): IRRIGATION AND DEBRIDEMENT LEFT KNEE W/ SYNOVECTOMY (Left)  Patient Location: PACU  Anesthesia Type:General  Level of Consciousness: awake and oriented  Airway & Oxygen Therapy: Patient Spontanous Breathing and Patient connected to nasal cannula oxygen  Post-op Assessment: Report given to RN and Patient moving all extremities X 4  Post vital signs: Reviewed and stable  Last Vitals:  Vitals:   08/06/16 1306  BP: (!) 125/91  Pulse: (!) 105  Resp: 18  Temp: 36.8 C    Last Pain:  Vitals:   08/06/16 1306  TempSrc: Oral         Complications: No apparent anesthesia complications

## 2016-08-06 NOTE — Op Note (Signed)
[name] [id number] 08/06/2016   PRE-OP DIAGNOSIS: left knee infection  POST-OP DIAGNOSIS: same  PROCEDURE: left knee open I&D, synovectomy, drilling, placement of stimulin beads  ANESTHESIA: general  Amberrose Friebel G   Dictation #:  [id number]

## 2016-08-06 NOTE — Anesthesia Procedure Notes (Signed)
Anesthesia Regional Block:  Femoral nerve block  Pre-Anesthetic Checklist: ,, timeout performed, Correct Patient, Correct Site, Correct Laterality, Correct Procedure, Correct Position, site marked, Risks and benefits discussed,  Surgical consent,  Pre-op evaluation,  At surgeon's request and post-op pain management  Laterality: Left  Prep: chloraprep       Needles:  Injection technique: Single-shot  Needle Type: Echogenic Needle     Needle Length: 9cm 9 cm Needle Gauge: 21 and 21 G    Additional Needles:  Procedures: ultrasound guided (picture in chart) Femoral nerve block Narrative:  Start time: 08/06/2016 6:00 PM End time: 08/06/2016 6:05 PM Injection made incrementally with aspirations every 5 mL.  Performed by: Personally  Anesthesiologist: Suella Broad D  Additional Notes: No immediate complications noted. Pt tolerated well.

## 2016-08-06 NOTE — Op Note (Signed)
NAMEMIRANDA, MCELHINNY NO.:  [id number]  MEDICAL RECORD NO.:  [id number]  LOCATION:  [id number]                        FACILITY:  [facility]  PHYSICIAN:  Monico Blitz. Sreya Froio, M.D.DATE OF BIRTH:  1961/05/09  DATE OF PROCEDURE:  08/06/2016 DATE OF DISCHARGE:                              OPERATIVE REPORT   PREOPERATIVE DIAGNOSIS:  Left knee infection.  POSTOPERATIVE DIAGNOSIS:  Left knee infection.  PROCEDURE:  Left knee I and D and synovectomy.  ANESTHESIA:  General.  ATTENDING SURGEON:  Monico Blitz. Rhona Raider, M.D.  ASSISTANT:  Loni Dolly, PA.  INDICATION FOR PROCEDURE:  The patient is a 55 year old man a few weeks from a left knee arthroscopy.  About a week or 10 days out, he started to develop some significant pain and swelling.  Serial aspirations showed an increasing white blood cell count in the joint and eventually about a week ago, he was taken to the operating room for arthroscopic irrigation and debridement with placement of drains.  His cultures eventually grew MRSA, sensitive to vancomycin.  He has been on this IV antibiotic for about a week and is not progressing.  It persisted with significant swelling and terrible pain.  Despite the fact that his white count did go down and he has been afebrile, he was presumed to have a continued infection.  He is offered, at this point, open irrigation and debridement with thorough synovectomy and placement of drains.  Informed operative consent was obtained after discussion of possible complications including reaction to anesthesia and obviously continued infection.  SUMMARY OF FINDINGS AND PROCEDURE:  Under general anesthesia through a midline incision, a medial parapatellar arthrotomy was performed followed by thorough irrigation and debridement.  We removed a couple of yellow material consistent with chronic infection.  We then performed a thorough synovectomy for some very inflamed synovium.  I then drilled the  lateral femoral condyle with return of blood alone.  We performed thorough irrigation and debridement with 6 L of saline.  We placed a couple of drains and did place Stimulan beads antibiotic including gentamicin and vancomycin.  He was closed primarily and admitted for appropriate care.  DESCRIPTION OF PROCEDURE:  The patient was taken to the operating suite where general anesthetic was applied without difficulty.  He was positioned supine and prepped and draped in normal sterile fashion. After administration of his baseline antibiotic and an appropriate time out, the left leg was elevated, exsanguinated, and tourniquet inflated about the thigh.  I made a midline and longitudinal incision was sectioned down the extensor mechanism.  I made a medial parapatellar arthrotomy and subluxed the patella.  We drained a great deal of pus and then performed a synovectomy as described above.  I then irrigated with 6 L using a pulsatile lavage system.  We placed 2 medium Hemovac drains limbs through some old arthroscopic portals and then placed the aforementioned Stimulan beads which were created with gentamicin and vancomycin.  It should be noted the tourniquet was deflated prior to closure.  We achieved good hemostasis with pressure and use of a Bovie cautery.  I then reapproximated the extensor mechanism with a running absorbable stitch of V-Loc material.  We then  reapproximated other tissues with 0 and 2-0 undyed Vicryl and skin was closed with staples. Adaptic was applied followed by dry gauze and a loose Ace wrap. Estimated blood loss and fluids obtain from anesthesia records as can accurate tourniquet time.  DISPOSITION:  The patient was extubated in the operating room and taken to recovery room in stable addition.  He is to be admitted to the Orthopedic Surgery Service for appropriate care to include continued IV antibiotics.  We will likely leave his drain for few days.     Monico Blitz  Rhona Raider, M.D.     PGD/MEDQ  D:  08/06/2016  T:  08/06/2016  Job:  [id number]

## 2016-08-07 LAB — COMPREHENSIVE METABOLIC PANEL
ALT: 59 U/L (ref 17–63)
ANION GAP: 11 (ref 5–15)
AST: 26 U/L (ref 15–41)
Albumin: 2.2 g/dL — ABNORMAL LOW (ref 3.5–5.0)
Alkaline Phosphatase: 167 U/L — ABNORMAL HIGH (ref 38–126)
BUN: 9 mg/dL (ref 6–20)
CALCIUM: 9 mg/dL (ref 8.9–10.3)
CHLORIDE: 96 mmol/L — AB (ref 101–111)
CO2: 27 mmol/L (ref 22–32)
Creatinine, Ser: 0.79 mg/dL (ref 0.61–1.24)
Glucose, Bld: 106 mg/dL — ABNORMAL HIGH (ref 65–99)
Potassium: 4.2 mmol/L (ref 3.5–5.1)
SODIUM: 134 mmol/L — AB (ref 135–145)
Total Bilirubin: 0.7 mg/dL (ref 0.3–1.2)
Total Protein: 5.6 g/dL — ABNORMAL LOW (ref 6.5–8.1)

## 2016-08-07 MED ORDER — VANCOMYCIN HCL 10 G IV SOLR
1500.0000 mg | Freq: Two times a day (BID) | INTRAVENOUS | Status: DC
  Administered 2016-08-08 – 2016-08-09 (×3): 1500 mg via INTRAVENOUS
  Filled 2016-08-07 (×5): qty 1500

## 2016-08-07 MED ORDER — HYDROMORPHONE HCL 1 MG/ML IJ SOLN
2.0000 mg | Freq: Once | INTRAMUSCULAR | Status: AC
  Administered 2016-08-07: 2 mg via INTRAVENOUS
  Filled 2016-08-07: qty 2

## 2016-08-07 MED ORDER — HYDROCODONE-ACETAMINOPHEN 10-325 MG PO TABS
2.0000 | ORAL_TABLET | ORAL | Status: DC | PRN
  Administered 2016-08-07 – 2016-08-08 (×5): 2 via ORAL
  Filled 2016-08-07 (×5): qty 2

## 2016-08-07 MED ORDER — OXYCODONE HCL 5 MG PO TABS: 10.0000 mg | ORAL_TABLET | ORAL | Status: DC | PRN

## 2016-08-07 MED ORDER — HYDROMORPHONE HCL 1 MG/ML IJ SOLN
2.0000 mg | INTRAMUSCULAR | Status: DC | PRN
  Administered 2016-08-07 (×6): 2 mg via INTRAVENOUS
  Filled 2016-08-07 (×6): qty 2

## 2016-08-07 NOTE — Progress Notes (Signed)
PT Cancellation Note  Patient Details Name: [name] MRN: [id number] DOB: [birth year]   Cancelled Treatment:    Reason Eval/Treat Not Completed: Patient declined, no reason specified. Pt refusing PT for the second time today stating that he does not wish to have therapy services until tomorrow secondary to pain. PT will continue to f/u with pt as appropriate.   [place] 08/07/2016, 4:49 PM Sherie Don, [place], DPT [phone]

## 2016-08-07 NOTE — Progress Notes (Signed)
PT Cancellation Note  Patient Details Name: [name] MRN: [id number] DOB: 1961/07/08   Cancelled Treatment:    Reason Eval/Treat Not Completed: Patient declined, no reason specified. Pt refusing secondary to severe pain and saturated dressing. PT informed nurse of pt's request for additional pain meds and change of dressing. PT will continue to f/u with pt as appropriate.   [place]den 08/07/2016, 2:48 PM Sherie Don, [place], DPT [phone]

## 2016-08-07 NOTE — Progress Notes (Signed)
  PATIENT ID: MILAGRO TRAYER  MRN: [id number]  DOB/AGE:  Aug 02, 1961 / 55 y.o.  1 Day Post-Op Procedure(s) (LRB): IRRIGATION AND DEBRIDEMENT LEFT KNEE W/ SYNOVECTOMY (Left)  Subjective: Pain is improving with increase in dilaudid and starting of Norco.  No c/o chest pain or SOB.   Tol PO today   Objective: Vital signs in last 24 hours: Temp:  [97.6 F (36.4 C)-98.6 F (37 C)] 98.6 F (37 C) (08/12 0622) Pulse Rate:  [101-114] 102 (08/12 0622) Resp:  [17-23] 17 (08/12 0622) BP: (125-167)/(70-119) 157/92 (08/12 0622) SpO2:  [96 %-100 %] 98 % (08/12 0622) Weight:  [83.9 kg (185 lb)] 83.9 kg (185 lb) (08/11 1306)  Intake/Output from previous day: 08/11 0701 - 08/12 0700 In: 600 [I.V.:600] Out: 285 [Drains:135; Blood:150] Intake/Output this shift: No intake/output data recorded.  No results for input(s): HGB in the last 72 hours. No results for input(s): WBC, RBC, HCT, PLT in the last 72 hours.  Recent Labs  08/07/16 0541  NA 134*  K 4.2  CL 96*  CO2 27  BUN 9  CREATININE 0.79  GLUCOSE 106*  CALCIUM 9.0   No results for input(s): LABPT, INR in the last 72 hours.  Physical Exam: Sensation intact distally Intact pulses distally Dorsiflexion/Plantar flexion intact Incision: moderate drainage Compartment soft SS drainage into dressing, not yet reinforced  Assessment/Plan: 1 Day Post-Op Procedure(s) (LRB): IRRIGATION AND DEBRIDEMENT LEFT KNEE W/ SYNOVECTOMY (Left)   Up with therapy Continue ABX therapy due to Post-op infection Weight Bearing as Tolerated (WBAT)  Continue VTE prophylaxis Plan is to pull drain on Monday.   Rayvon Char Grandville Silos, [place], [place]  [id number] Office: [phone] Mobile: [phone]  08/07/2016, 12:41 PM

## 2016-08-07 NOTE — Care Management Note (Addendum)
Case Management Note  Patient Details  Name: [name] MRN: [id number] Date of Birth: 03-21-1961  Subjective/Objective:   Irrigation and Debridement left knee w/ Synovectomy                Action/Plan: Discharge Planning:  Patient is active with [facility] for IV abx. NCM will continue to follow up with dc planning. Will need resumption of care orders.     Expected Discharge Date:                  Expected Discharge Plan:  [place]  In-House Referral:  NA  Discharge planning Services  CM Consult  Post Acute Care Choice:  Home Health, Resumption of Svcs/PTA Provider Choice offered to:  Patient  DME Arranged:  N/A DME Agency:  NA  HH Arranged:  RN, IV Antibiotics HH Agency:  [facility]  Status of Service:  In process, will continue to follow  If discussed at Long Length of Stay Meetings, dates discussed:    Additional Comments:  Erenest Rasher, RN 08/07/2016, 4:10 PM

## 2016-08-08 LAB — COMPREHENSIVE METABOLIC PANEL
ALBUMIN: 2.2 g/dL — AB (ref 3.5–5.0)
ALT: 47 U/L (ref 17–63)
AST: 21 U/L (ref 15–41)
Alkaline Phosphatase: 151 U/L — ABNORMAL HIGH (ref 38–126)
Anion gap: 11 (ref 5–15)
BUN: 10 mg/dL (ref 6–20)
CHLORIDE: 94 mmol/L — AB (ref 101–111)
CO2: 29 mmol/L (ref 22–32)
Calcium: 8.9 mg/dL (ref 8.9–10.3)
Creatinine, Ser: 0.78 mg/dL (ref 0.61–1.24)
GFR calc Af Amer: >60 mL/min (ref >60)
GFR calc non Af Amer: >60 mL/min (ref >60)
GLUCOSE: 113 mg/dL — AB (ref 65–99)
POTASSIUM: 4 mmol/L (ref 3.5–5.1)
Sodium: 134 mmol/L — ABNORMAL LOW (ref 135–145)
Total Bilirubin: 0.6 mg/dL (ref 0.3–1.2)
Total Protein: 5.7 g/dL — ABNORMAL LOW (ref 6.5–8.1)

## 2016-08-08 MED ORDER — DIPHENHYDRAMINE HCL 12.5 MG/5ML PO ELIX: 12.5000 mg | ORAL_SOLUTION | Freq: Four times a day (QID) | ORAL | Status: DC | PRN

## 2016-08-08 MED ORDER — HYDROCODONE-ACETAMINOPHEN 5-325 MG PO TABS
2.0000 | ORAL_TABLET | ORAL | Status: DC
Start: 2016-08-08 — End: 2016-08-09
  Administered 2016-08-08 – 2016-08-09 (×7): 2 via ORAL
  Filled 2016-08-08 (×5): qty 2

## 2016-08-08 MED ORDER — HYDROMORPHONE 1 MG/ML IV SOLN
INTRAVENOUS | Status: DC
  Administered 2016-08-08: 0.5 mg via INTRAVENOUS
  Administered 2016-08-08: 5 mg via INTRAVENOUS
  Administered 2016-08-08: 4 mg via INTRAVENOUS
  Administered 2016-08-08: 3.5 mg via INTRAVENOUS
  Administered 2016-08-08: 6 mg via INTRAVENOUS
  Administered 2016-08-08: 16:00:00 via INTRAVENOUS
  Administered 2016-08-09: 2 mg via INTRAVENOUS
  Filled 2016-08-08 (×2): qty 25

## 2016-08-08 MED ORDER — OXYCODONE HCL ER 20 MG PO T12A
20.0000 mg | EXTENDED_RELEASE_TABLET | Freq: Two times a day (BID) | ORAL | Status: DC
  Administered 2016-08-08 – 2016-08-09 (×3): 20 mg via ORAL
  Filled 2016-08-08 (×3): qty 1

## 2016-08-08 MED ORDER — SODIUM CHLORIDE 0.9% FLUSH: 9.0000 mL | INTRAVENOUS | Status: DC | PRN

## 2016-08-08 MED ORDER — DIPHENHYDRAMINE HCL 50 MG/ML IJ SOLN: 12.5000 mg | Freq: Four times a day (QID) | INTRAMUSCULAR | Status: DC | PRN

## 2016-08-08 MED ORDER — NALOXONE HCL 0.4 MG/ML IJ SOLN: 0.4000 mg | INTRAMUSCULAR | Status: DC | PRN

## 2016-08-08 MED ORDER — ONDANSETRON HCL 4 MG/2ML IJ SOLN: 4.0000 mg | Freq: Four times a day (QID) | INTRAMUSCULAR | Status: DC | PRN

## 2016-08-08 NOTE — Progress Notes (Addendum)
  PATIENT ID: [name]  MRN: [id number]  DOB/AGE:  August 28, 1961 / 55 y.o.  2 Days Post-Op Procedure(s) (LRB): IRRIGATION AND DEBRIDEMENT LEFT KNEE W/ SYNOVECTOMY (Left)  Subjective: Pain seems better with current regimen, which is Oxycontin, Norco, and Dilaudid PCA.  No c/o chest pain or SOB.  Tolerating PO, reports + flatus.  Reports trying to get up yesterday with PT, but "it didn't go well." Today, was up and walking with PT down the hall-way.  Objective: Vital signs in last 24 hours: Temp:  [97.7 F (36.5 C)-98.5 F (36.9 C)] 97.7 F (36.5 C) (08/13 0627) Pulse Rate:  [97-103] 97 (08/13 0627) Resp:  [16-20] 16 (08/13 0853) BP: (148-156)/(81-102) 156/102 (08/13 0627) SpO2:  [97 %-100 %] 99 % (08/13 0853)  Intake/Output from previous day: 08/12 0701 - 08/13 0700 In: 1000 [I.V.:500; IV Piggyback:500] Out: -  Intake/Output this shift: No intake/output data recorded.  No results for input(s): HGB in the last 72 hours. No results for input(s): WBC, RBC, HCT, PLT in the last 72 hours.  Recent Labs  08/07/16 0541 08/08/16 0542  NA 134* 134*  K 4.2 4.0  CL 96* 94*  CO2 27 29  BUN 9 10  CREATININE 0.79 0.78  GLUCOSE 106* 113*  CALCIUM 9.0 8.9   No results for input(s): LABPT, INR in the last 72 hours.  Physical Exam: Sensation intact distally Intact pulses distally Dorsiflexion/Plantar flexion intact Incision: moderate drainage Compartment soft SS drainage into dressing, fairly clean externally now after being reinforced yesterday  Assessment/Plan: 2 Days Post-Op Procedure(s) (LRB): IRRIGATION AND DEBRIDEMENT LEFT KNEE W/ SYNOVECTOMY (Left)   Up with therapy Continue ABX therapy due to Post-op infection Weight Bearing as Tolerated (WBAT) Continue VTE prophylaxis (SCDs) Current Pain management regimen:  Dilaudid PCA 0.5mg  q 15 min, Oxycontin 20 BID, Norco 5/325 2 Q4 standing Explained the pros and cons of using Oxycontin, and the demand it places upon absolute  compliance with dosing schedule. Plan is to pull drain on Monday and Dr. Rhona Raider to decide upon further treatment plan(s) at that time.   [name], [place], [place]  [address] Office: [phone] Mobile: [phone]  08/08/2016, 11:13 AM

## 2016-08-08 NOTE — Evaluation (Signed)
Physical Therapy Evaluation Patient Details Name: [name] MRN: [id number] DOB: [birth year] Today's Date: 08/08/2016   History of Present Illness  Admitted with ongoing knee pain, septic joint; Now s/p left knee open I&D, synovectomy, drilling, placement of stimulin beads;  has a past medical history of Allergy; Asthma; Colon polyps; GERD (gastroesophageal reflux disease); Hypertension; Pleurisy; Rectal fissure; Seasonal allergies; and Skin cancer.  Clinical Impression   Patient is s/p above surgery resulting in functional limitations due to the deficits listed below (see PT Problem List).  Patient will benefit from skilled PT to increase their independence and safety with mobility to allow discharge to the venue listed below.       Follow Up Recommendations Home health PT;Other (comment) (Also HHRN for possible need for antibiotics)    Equipment Recommendations  None recommended by PT (perhaps RW if unstable on crutches)    Recommendations for Other Services       Precautions / Restrictions Precautions Precautions: Knee Precaution Comments: Educated pt on optimal positioning for healing Restrictions LLE Weight Bearing: Weight bearing as tolerated      Mobility  Bed Mobility Overal bed mobility: Needs Assistance Bed Mobility: Supine to Sit     Supine to sit: Supervision     General bed mobility comments: Supervision mostly for line management and occasional cues for technique; uses RLE to assist LLE  Transfers Overall transfer level: Needs assistance Equipment used: Rolling walker (2 wheeled) Transfers: Sit to/from Stand Sit to Stand: Supervision         General transfer comment: Cues for technqiue and supervision for line management  Ambulation/Gait Ambulation/Gait assistance: Supervision Ambulation Distance (Feet): 110 Feet Assistive device: Rolling walker (2 wheeled) Gait Pattern/deviations: Step-through pattern (emerging)     General Gait Details:  Cues for gait sequence and to self-monitor for activity tolerance; heavy dependence on RW to [employer] painful knee  Stairs            Wheelchair Mobility    Modified Rankin (Stroke Patients Only)       Balance                                             Pertinent Vitals/Pain Pain Assessment: 0-10 Pain Score: 9  Pain Location: L knee Pain Descriptors / Indicators: Aching Pain Intervention(s): Limited activity within patient's tolerance;Monitored during session;PCA encouraged;Repositioned    Home Living Family/patient expects to be discharged to:: Private residence Living Arrangements: Spouse/significant other;Children Available Help at Discharge: Available PRN/intermittently Type of Home: House Home Access: Stairs to enter Entrance Stairs-Rails: Psychiatric nurse of Steps: 3 Home Layout: One level Home Equipment: Crutches      Prior Function Level of Independence: Independent         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[employer], forensic        Extremity/Trunk Assessment   Upper Extremity Assessment: Overall WFL for tasks assessed           Lower Extremity Assessment: LLE deficits/detail   LLE Deficits / Details: Grossly decr AROM and strength, limited by pain  Cervical / Trunk Assessment: Normal  Communication   Communication: No difficulties  Cognition Arousal/Alertness: Awake/alert Behavior During Therapy: WFL for tasks assessed/performed;Impulsive Overall Cognitive Status: Within Functional Limits for tasks assessed  General Comments General comments (skin integrity, edema, etc.): Pt educated in the need to get full knee extension for optimal healing; He tolerated ankle roll    Exercises General Exercises - Lower Extremity Quad Sets: AROM;Left;10 reps      Assessment/Plan    PT Assessment Patient needs continued PT services  PT Diagnosis Difficulty walking;Acute pain    PT Problem List Decreased strength;Decreased range of motion;Decreased activity tolerance;Decreased mobility;Decreased knowledge of use of DME;Pain;Decreased knowledge of precautions  PT Treatment Interventions DME instruction;Gait training;Stair training;Functional mobility training;Therapeutic activities;Therapeutic exercise;Patient/family education   PT Goals (Current goals can be found in the Care Plan section) Acute Rehab PT Goals Patient Stated Goal: wants to get back to work in [place] PT Goal Formulation: With patient Time For Goal Achievement: 08/22/16 Potential to Achieve Goals: Good    Frequency Min 5X/week   Barriers to discharge        Co-evaluation               End of Session   Activity Tolerance: Patient tolerated treatment well Patient left: in chair;with call bell/phone within reach;with family/visitor present Nurse Communication: Mobility status         Time: [id number] PT Time Calculation (min) (ACUTE ONLY): 32 min   Charges:   PT Evaluation $PT Eval Moderate Complexity: 1 Procedure PT Treatments $Gait Training: 8-22 mins   PT G Codes:        Quin Hoop 08/08/2016, 12:49 PM  Roney Marion, [place] Pager [phone] Office [phone]

## 2016-08-08 NOTE — Progress Notes (Signed)
MD Grandville Silos was contacted due to patients pain not being managed effectively. MD ordered to discontinue Oxcodone PRN. Start Oxycontine 20 mg P.O Q12, Narco 2 tab q4 scheduled and start patient on PCA pump of Hydromorphone 0.5 mg every 15 mins for breakthrough pain.

## 2016-08-09 ENCOUNTER — Encounter (HOSPITAL_COMMUNITY): Payer: Self-pay | Admitting: Orthopaedic Surgery

## 2016-08-09 LAB — COMPREHENSIVE METABOLIC PANEL
ALT: 44 U/L (ref 17–63)
ANION GAP: 8 (ref 5–15)
AST: 23 U/L (ref 15–41)
Albumin: 2.2 g/dL — ABNORMAL LOW (ref 3.5–5.0)
Alkaline Phosphatase: 185 U/L — ABNORMAL HIGH (ref 38–126)
BILIRUBIN TOTAL: 0.6 mg/dL (ref 0.3–1.2)
BUN: 7 mg/dL (ref 6–20)
CHLORIDE: 95 mmol/L — AB (ref 101–111)
CO2: 31 mmol/L (ref 22–32)
Calcium: 9.1 mg/dL (ref 8.9–10.3)
Creatinine, Ser: 0.76 mg/dL (ref 0.61–1.24)
Glucose, Bld: 108 mg/dL — ABNORMAL HIGH (ref 65–99)
POTASSIUM: 4 mmol/L (ref 3.5–5.1)
Sodium: 134 mmol/L — ABNORMAL LOW (ref 135–145)
TOTAL PROTEIN: 6 g/dL — AB (ref 6.5–8.1)

## 2016-08-09 MED ORDER — ALTEPLASE 2 MG IJ SOLR: 2.0000 mg | Freq: Once | INTRAMUSCULAR | Status: DC

## 2016-08-09 MED ORDER — OXYCODONE HCL ER 20 MG PO T12A: 20.0000 mg | EXTENDED_RELEASE_TABLET | Freq: Two times a day (BID) | ORAL | 0 refills | Status: DC

## 2016-08-09 MED ORDER — SODIUM CHLORIDE 0.9% FLUSH: 10.0000 mL | INTRAVENOUS | Status: DC | PRN

## 2016-08-09 MED ORDER — VANCOMYCIN HCL 10 G IV SOLR: 1500.0000 mg | Freq: Two times a day (BID) | INTRAVENOUS | 0 refills | Status: DC

## 2016-08-09 NOTE — Progress Notes (Signed)
Subjective: 3 Days Post-Op Procedure(s) (LRB): IRRIGATION AND DEBRIDEMENT LEFT KNEE W/ SYNOVECTOMY (Left)   Patient is doing much better this morning. Was up with PT yesterday. Has significantly cut back on IV pain meds.   Activity level:  wbat Diet tolerance:  ok Voiding:  ok Patient reports pain as mild and moderate.    Objective: Vital signs in last 24 hours: Temp:  [98.7 F (37.1 C)-98.8 F (37.1 C)] 98.7 F (37.1 C) (08/13 2252) Pulse Rate:  [102-110] 102 (08/13 2252) Resp:  [12-16] 16 (08/14 0523) BP: (155-157)/(88-101) 155/88 (08/13 2252) SpO2:  [97 %-100 %] 97 % (08/13 2348)  Labs: No results for input(s): HGB in the last 72 hours. No results for input(s): WBC, RBC, HCT, PLT in the last 72 hours.  Recent Labs  08/08/16 0542 08/09/16 0451  NA 134* 134*  K 4.0 4.0  CL 94* 95*  CO2 29 31  BUN 10 7  CREATININE 0.78 0.76  GLUCOSE 113* 108*  CALCIUM 8.9 9.1   No results for input(s): LABPT, INR in the last 72 hours.  Physical Exam:  Neurologically intact ABD soft Neurovascular intact Sensation intact distally Intact pulses distally Dorsiflexion/Plantar flexion intact Incision: dressing C/D/I and no drainage No cellulitis present Compartment soft  Assessment/Plan:  3 Days Post-Op Procedure(s) (LRB): IRRIGATION AND DEBRIDEMENT LEFT KNEE W/ SYNOVECTOMY (Left) Advance diet Up with therapy Discharge home with home health this afternoon if doing well and off IV pain meds.  I changed dressing this morning. We have D/C the PCA. We will see him in office Friday morning.  We will make sure that PICC line is working properly and that home health for IV antibiotics is set up.   Prisila Dlouhy, [place] Sachs 08/09/2016, 9:20 AM

## 2016-08-09 NOTE — Progress Notes (Signed)
During exchange procedure as introducer was being removed patient felt pain to his fingertips.  Once introducer was removed he stated that pain had lessened but still remained.  Once procedure complete patient instructed to move fingers and bend elbow to increase circulation to arm.  Patient was able to feel light touches to all finger tips.  Patient states that there is still slight pain but much improved from initial.  Instructed patient to continue to move arm and fingers as needed and notify RN if this worsens as we will need to remove PICC and replace on left arm.  Carolynn Sayers, RN [facility] and bedside nurse updated on this occurrence.  Carolee Rota, RN VAST

## 2016-08-09 NOTE — Progress Notes (Signed)
Physical Therapy Treatment Patient Details Name: [name] MRN: [id number] DOB: 1961/04/18 Today's Date: 08/09/2016    History of Present Illness Admitted with ongoing knee pain, septic joint; Now s/p left knee open I&D, synovectomy, drilling, placement of stimulin beads;  has a past medical history of Allergy; Asthma; Colon polyps; GERD (gastroesophageal reflux disease); Hypertension; Pleurisy; Rectal fissure; Seasonal allergies; and Skin cancer.    PT Comments    Pt performed increased mobility and reviewed stair training in prep for d/c.  Pt initially impulsive and required cues to slow down and process technique to ensure safety and smooth transition at home.  Pt ready for d/c at this time and educated to continue to allow weight bearing as much as possible to L knee.     Follow Up Recommendations  Home health PT;Other (comment) (Also HHRN for antibiotics.  )     Equipment Recommendations  None recommended by PT    Recommendations for Other Services       Precautions / Restrictions Precautions Precautions: Knee Precaution Comments: Educated pt on optimal positioning for healing Restrictions Weight Bearing Restrictions: Yes LLE Weight Bearing: Weight bearing as tolerated Other Position/Activity Restrictions: Pt refuses to allow weight bearing.      Mobility  Bed Mobility Overal bed mobility: Modified Independent Bed Mobility: Supine to Sit     Supine to sit: Modified independent (Device/Increase time)     General bed mobility comments: No assist needed.    Transfers Overall transfer level: Modified independent Equipment used: Crutches Transfers: Sit to/from Stand Sit to Stand: Supervision         General transfer comment: Pt impulsive and already standing before crutches can be brought to patient.  Pt unsteady in standing and required re-education for safety with transfers.    Ambulation/Gait Ambulation/Gait assistance: Supervision Ambulation Distance  (Feet): 250 Feet Assistive device: Crutches Gait Pattern/deviations: Step-through pattern;Decreased stance time - left;Shuffle;Trunk flexed   Gait velocity interpretation: Below normal speed for age/gender General Gait Details: Cues for gait sequence and to self-monitor for activity tolerance; heavy dependence on crutches to unweigh painful knee   Stairs Stairs: Yes Stairs assistance: Min guard Stair Management: One rail Left;One rail Right Number of Stairs: 14 (2 sideways with L rail and 12 forwards with R rail.  ) General stair comments: Cues for sequencing and crutch placement, hand placement and foot placement.  Pt performed NWB and refused to add weight to surical limb.  Pt impulsive and would benefit from slowing down movement.    Wheelchair Mobility    Modified Rankin (Stroke Patients Only)       Balance Overall balance assessment: Needs assistance   Sitting balance-Leahy Scale: Normal       Standing balance-Leahy Scale: Fair                      Cognition Arousal/Alertness: Awake/alert Behavior During Therapy: WFL for tasks assessed/performed;Impulsive;Anxious Overall Cognitive Status: Within Functional Limits for tasks assessed                      Exercises      General Comments        Pertinent Vitals/Pain Pain Assessment: 0-10 Pain Score: 9  Pain Location: L knee Pain Descriptors / Indicators: Aching Pain Intervention(s): Limited activity within patient's tolerance;Repositioned;Ice applied    Home Living                      Prior Function  PT Goals (current goals can now be found in the care plan section) Acute Rehab PT Goals Patient Stated Goal: wants to get back to work in [employer] to Achieve Goals: Good Progress towards PT goals: Progressing toward goals    Frequency  Min 5X/week    PT Plan Current plan remains appropriate    Co-evaluation             End of Session Equipment  Utilized During Treatment: Gait belt Activity Tolerance: Patient tolerated treatment well Patient left: in chair;with call bell/phone within reach;with family/visitor present     Time: [id number] PT Time Calculation (min) (ACUTE ONLY): 16 min  Charges:  $Gait Training: 8-22 mins                    G Codes:      Cristela Blue 09/05/16, 3:44 PM  Governor Rooks, PTA pager [phone]

## 2016-08-09 NOTE — Progress Notes (Signed)
Arrived at pts room for assessment. PICC drsg loose and noted to be out approx 16 cm. Secured drsg and notified nurse that she needs to order a PICC line exchange. Placing order as soon as possible d/t pt plan for DC today. Updated pt and wife. VU. Fran Lowes, RN VAST

## 2016-08-09 NOTE — Progress Notes (Signed)
PT Cancellation Note  Patient Details Name: [name] MRN: [id number] DOB: [birth year]   Cancelled Treatment:    Reason Eval/Treat Not Completed: Patient at procedure or test/unavailable (sterile procedure to place PICC line vs unclot PICC line.  )   Brenya Taulbee Eli Hose 08/09/2016, 1:46 PM Governor Rooks, PTA pager [phone]

## 2016-08-09 NOTE — Progress Notes (Signed)
[name] to be D/C'd Home per MD order.  Discussed with the patient and all questions fully answered.  VSS, Skin clean, dry and intact without evidence of skin break down, no evidence of skin tears noted. IV catheter discontinued intact. Site without signs and symptoms of complications. Dressing and pressure applied.  An After Visit Summary was printed and given to the patient. Patient received prescription.  D/c education completed with patient/family including follow up instructions, medication list, d/c activities limitations if indicated, with other d/c instructions as indicated by MD - patient able to verbalize understanding, all questions fully answered.   Patient instructed to return to ED, call 911, or call MD for any changes in condition.   Patient escorted via [place], and D/C home via private auto.  Jerry Caras 08/09/2016 7:20 PM

## 2016-08-09 NOTE — Anesthesia Postprocedure Evaluation (Signed)
Anesthesia Post Note  Patient: [name]  Procedure(s) Performed: Procedure(s) (LRB): IRRIGATION AND DEBRIDEMENT LEFT KNEE W/ SYNOVECTOMY (Left)  Patient location during evaluation: PACU Anesthesia Type: General Level of consciousness: sedated and patient cooperative Pain management: pain level controlled Vital Signs Assessment: post-procedure vital signs reviewed and stable Respiratory status: spontaneous breathing Cardiovascular status: stable Anesthetic complications: no    Last Vitals:  Vitals:   08/09/16 0523 08/09/16 0800  BP:    Pulse:    Resp: 16 16  Temp:      Last Pain:  Vitals:   08/09/16 0815  TempSrc:   PainSc: [name]

## 2016-08-09 NOTE — Progress Notes (Signed)
Peripherally Inserted Central Catheter/Midline Placement  The IV Nurse has discussed with the patient and/or persons authorized to consent for the patient, the purpose of this procedure and the potential benefits and risks involved with this procedure.  The benefits include less needle sticks, lab draws from the catheter and patient may be discharged home with the catheter.  Risks include, but not limited to, infection, bleeding, blood clot (thrombus formation), and puncture of an artery; nerve damage and irregular heat beat.  Alternatives to this procedure were also discussed.  PICC/Midline Placement Documentation  PICC Single Lumen [id number] PICC Right Basilic 37 cm 0 cm (Active)  Indication for Insertion or Continuance of Line Administration of hyperosmolar/irritating solutions (i.e. TPN, Vancomycin, etc.) 08/08/2016  8:00 AM  Exposed Catheter (cm) 16 cm 08/09/2016 12:29 PM  Site Assessment Clean;Dry;Intact 08/09/2016  8:15 AM  Line Status Infusing 08/09/2016  8:15 AM  Dressing Type Transparent 08/09/2016  8:15 AM  Dressing Status Clean;Dry;Intact 08/08/2016 11:56 PM  Line Care Connections checked and tightened 07/31/2016 11:11 AM  Line Adjustment (NICU/IV Team Only) No 07/31/2016 11:11 AM  Dressing Intervention Dressing reinforced 08/09/2016 12:29 PM  Dressing Change Due 08/07/16 08/02/2016  1:01 PM       Henderson [place] 08/09/2016, 1:34 PM  Consent obtained by Carolee Rota, RN for PICC exchange.

## 2016-08-11 DIAGNOSIS — T8454XA Infection and inflammatory reaction due to internal left knee prosthesis, initial encounter: Secondary | ICD-10-CM | POA: Diagnosis not present

## 2016-08-11 DIAGNOSIS — M25562 Pain in left knee: Secondary | ICD-10-CM | POA: Diagnosis not present

## 2016-08-11 DIAGNOSIS — Z452 Encounter for adjustment and management of vascular access device: Secondary | ICD-10-CM | POA: Diagnosis not present

## 2016-08-11 DIAGNOSIS — Z5181 Encounter for therapeutic drug level monitoring: Secondary | ICD-10-CM | POA: Diagnosis not present

## 2016-08-15 DIAGNOSIS — T8454XA Infection and inflammatory reaction due to internal left knee prosthesis, initial encounter: Secondary | ICD-10-CM | POA: Diagnosis not present

## 2016-08-15 DIAGNOSIS — Z792 Long term (current) use of antibiotics: Secondary | ICD-10-CM | POA: Diagnosis not present

## 2016-08-16 DIAGNOSIS — M25562 Pain in left knee: Secondary | ICD-10-CM | POA: Diagnosis not present

## 2016-08-19 DIAGNOSIS — M25462 Effusion, left knee: Secondary | ICD-10-CM | POA: Diagnosis not present

## 2016-08-19 DIAGNOSIS — T8454XA Infection and inflammatory reaction due to internal left knee prosthesis, initial encounter: Secondary | ICD-10-CM | POA: Diagnosis not present

## 2016-08-20 DIAGNOSIS — T8454XA Infection and inflammatory reaction due to internal left knee prosthesis, initial encounter: Secondary | ICD-10-CM | POA: Diagnosis not present

## 2016-08-20 DIAGNOSIS — A4902 Methicillin resistant Staphylococcus aureus infection, unspecified site: Secondary | ICD-10-CM | POA: Diagnosis not present

## 2016-08-20 DIAGNOSIS — M01X62 Direct infection of left knee in infectious and parasitic diseases classified elsewhere: Secondary | ICD-10-CM | POA: Diagnosis not present

## 2016-08-20 DIAGNOSIS — M25462 Effusion, left knee: Secondary | ICD-10-CM | POA: Diagnosis not present

## 2016-08-22 DIAGNOSIS — Z792 Long term (current) use of antibiotics: Secondary | ICD-10-CM | POA: Diagnosis not present

## 2016-08-22 DIAGNOSIS — T8454XA Infection and inflammatory reaction due to internal left knee prosthesis, initial encounter: Secondary | ICD-10-CM | POA: Diagnosis not present

## 2016-08-23 DIAGNOSIS — M25562 Pain in left knee: Secondary | ICD-10-CM | POA: Diagnosis not present

## 2016-08-23 NOTE — Discharge Summary (Signed)
Patient ID: [name] MRN: [id number] DOB/AGE: 1961-09-28 55 y.o.  Admit date: 07/29/2016 Discharge date: 08/02/16  Admission Diagnoses:  Active Problems:   Septic joint of left knee joint (HCC)   Left knee pain   Routine screening for STI (sexually transmitted infection)   Discharge Diagnoses:  Same  Past Medical History:  Diagnosis Date  . Allergy   . Asthma    bronchitis  . Colon polyps   . GERD (gastroesophageal reflux disease)   . Hypertension   . Pleurisy    hx-12/13  . Rectal fissure   . Seasonal allergies   . Skin cancer     Surgeries: Procedure(s): left knee arthroscopic wash out on 07/29/2016 - 07/30/2016   Consultants: Treatment Team:  Frederik Pear, MD  Discharged Condition: Improved  Hospital Course: [name] is an 55 y.o. male who was admitted 07/29/2016 for operative treatment of<principal problem not specified>. Patient has severe unremitting pain that affects sleep, daily activities, and work/hobbies. After pre-op clearance the patient was taken to the operating room on 07/29/2016 - 07/30/2016 and underwent  Procedure(s): left knee arthroscopic wash out.    Patient was given perioperative antibiotics:  Anti-infectives    Start     Dose/Rate Route Frequency Ordered Stop   08/02/16 1400  vancomycin (VANCOCIN) IVPB 1000 mg/200 mL premix  Status:  Discontinued     1,000 mg 200 mL/hr over 60 Minutes Intravenous Every 8 hours 08/02/16 1340 08/02/16 1857   08/02/16 0100  vancomycin (VANCOCIN) 1,500 mg in sodium chloride 0.9 % 500 mL IVPB  Status:  Discontinued     1,500 mg 250 mL/hr over 120 Minutes Intravenous Every 12 hours 08/01/16 1403 08/02/16 1340   08/02/16 0000  vancomycin 1,500 mg in sodium chloride 0.9 % 500 mL  Status:  Discontinued    Comments:  Patient will need 6 week duration for BID dosing   1,500 mg 250 mL/hr over 120 Minutes Intravenous Every 12 hours 08/02/16 1211 08/09/16    07/31/16 1300  vancomycin (VANCOCIN) 1,500 mg in sodium  chloride 0.9 % 500 mL IVPB  Status:  Discontinued     1,500 mg 250 mL/hr over 120 Minutes Intravenous Every 12 hours 07/30/16 1607 08/01/16 1403   07/30/16 1700  cefTRIAXone (ROCEPHIN) 2 g in dextrose 5 % 50 mL IVPB  Status:  Discontinued     2 g 100 mL/hr over 30 Minutes Intravenous Every 24 hours 07/30/16 1628 08/01/16 1621   07/30/16 1322  polymyxin B 500,000 Units, bacitracin 50,000 Units in sodium chloride irrigation 0.9 % 500 mL irrigation  Status:  Discontinued       As needed 07/30/16 1322 07/30/16 1347   07/30/16 1215  vancomycin (VANCOCIN) 1,500 mg in sodium chloride 0.9 % 500 mL IVPB     1,500 mg 250 mL/hr over 120 Minutes Intravenous On call to O.R. 07/30/16 1206 07/30/16 1440   07/30/16 0400  vancomycin (VANCOCIN) 1,250 mg in sodium chloride 0.9 % 250 mL IVPB     1,250 mg 166.7 mL/hr over 90 Minutes Intravenous  Once 07/30/16 0346 07/30/16 0746   07/30/16 0400  cefTRIAXone (ROCEPHIN) 1 g in dextrose 5 % 50 mL IVPB     1 g 100 mL/hr over 30 Minutes Intravenous  Once 07/30/16 0350 07/30/16 0448       Patient was given sequential compression devices, early ambulation, and chemoprophylaxis to prevent DVT.  Patient benefited maximally from hospital stay and there were no complications.    Recent  vital signs: No data found.    Recent laboratory studies: No results for input(s): WBC, HGB, HCT, PLT, NA, K, CL, CO2, BUN, CREATININE, GLUCOSE, INR, CALCIUM in the last 72 hours.  Invalid input(s): PT, 2   Discharge Medications:     Medication List    STOP taking these medications   cephALEXin 500 MG capsule Commonly known as:  KEFLEX   HYDROcodone-acetaminophen 7.5-325 MG tablet Commonly known as:  NORCO   HYDROmorphone 2 MG tablet Commonly known as:  DILAUDID   ibuprofen 200 MG tablet Commonly known as:  ADVIL,MOTRIN     TAKE these medications   amLODipine 10 MG tablet Commonly known as:  NORVASC Take 1 tablet (10 mg total) by mouth daily.    amphetamine-dextroamphetamine 30 MG tablet Commonly known as:  ADDERALL Take 30 mg by mouth daily as needed (ADHD).   calcium carbonate 500 MG chewable tablet Commonly known as:  TUMS - dosed in mg elemental calcium Chew 1-2 tablets by mouth as needed for heartburn.   cyclobenzaprine 10 MG tablet Commonly known as:  FLEXERIL Take 10 mg by mouth every 6 (six) hours as needed for muscle spasms.   fluticasone 50 MCG/ACT nasal spray Commonly known as:  FLONASE Place 2 sprays into both nostrils daily as needed for allergies.   lisinopril 20 MG tablet Commonly known as:  PRINIVIL,ZESTRIL Take 1 tablet (20 mg total) by mouth daily.   OSTEO BI-FLEX JOINT SHIELD Tabs Take 1 tablet by mouth daily. Reported on 05/13/2016   ranitidine 150 MG tablet Commonly known as:  ZANTAC Take 150 mg by mouth daily.       Diagnostic Studies: Mr Knee Left W Wo Contrast  Result Date: 08/05/2016 CLINICAL DATA:  Knee pain and fever.  Muscle spasms. EXAM: MRI OF THE LEFT KNEE WITHOUT AND WITH CONTRAST TECHNIQUE: Multiplanar, multisequence MR imaging of the knee was performed before and after the administration of intravenous contrast. CONTRAST:  11mL MULTIHANCE GADOBENATE DIMEGLUMINE 529 MG/ML IV SOLN COMPARISON:  07/29/2016 FINDINGS: Despite efforts by the technologist and patient, motion artifact is present on today's exam and could not be eliminated. This reduces exam sensitivity and specificity. MENISCI Medial meniscus: Much of the medial meniscus is diminutive. There seems to be a flap of tissue medial to the PCL for example on image 6/5, possibly a meniscal flap. Lateral meniscus: Grade 3 horizontal signal in the midbody lateral meniscus image 10/4. LIGAMENTS Cruciates:  The ACL graft is intact.  PCL intact. Collaterals:  Proximal popliteus tendinopathy. CARTILAGE Patellofemoral: Prominent degenerative chondral thinning. Marginal spurring in the patella. Medial: Prominent degenerative chondral thinning. Mild  marginal spurring. Small degenerative subcortical foci of edema especially along the medial femoral condyle. Lateral: Prominent degenerative chondral thinning. Marginal spurring. Joint: Large knee effusion with thickened enhancing synovium for example image 13/10. Amount of fluid in the joint approximately 80 cc. Popliteal Fossa: Small Baker's cyst. Mild distal semimembranosus tendinopathy. Extensor Mechanism: Prepatellar edema also tracks along the retinacula. Bones: Low-level edema and mild marrow enhancement in the lateral femoral condyle. Other: No supplemental non-categorized findings. IMPRESSION: 1. Large knee effusion (approximately 80 cc) with considerable synovial thickening and enhancement. Given the patient's recent knee surgery, this raises concern for the possibility of septic knee joint, and arthrocentesis or drainage may be warranted. 2. Small amount of edema and enhancement in the lateral femoral condyle, could well be reactive, not considered specific for osteomyelitis at this point, but may warrant monitoring. 3. Diminutive medial meniscus, questionable flap of tissue medial  to the PCL could be a meniscal flap but is technically nonspecific. 4. Grade 3 horizontal signal in the midbody lateral meniscus, could be from tear or could be postoperative. 5. Proximal popliteus tendinopathy. 6. The ACL graft is intact. 7. Prominent degenerative chondral thinning in all 3 compartments of the knee. 8. Small Baker's cyst.  Mild distal semimembranosus tendinopathy. 9. Prepatellar edema. Electronically Signed   By: Van Clines M.D.   On: 08/05/2016 16:15   Dg Knee Complete 4 Views Left  Result Date: 07/29/2016 CLINICAL DATA:  Left knee surgery 2 weeks prior. Fluid build up requiring drainage twice in the last 2 weeks, last drainage yesterday. Increased swelling since that time. EXAM: LEFT KNEE - COMPLETE 4+ VIEW COMPARISON:  None. FINDINGS: Moderate to large suprapatellar joint effusion. No definite  internal air. No acute osseous abnormalities. Prior ACL repair, anchors are in appropriate position. Medial tibial femoral joint space narrowing and peripheral spurring. Tiny patellofemoral osteophytes. No bony destructive change. IMPRESSION: Moderate to large suprapatellar joint effusion. No acute osseous abnormality or bony destructive change. Electronically Signed   By: Jeb Levering M.D.   On: 07/29/2016 20:47    Disposition: 01-Home or Self Care  Discharge Instructions    Call MD / Call 911    Complete by:  As directed   If you experience chest pain or shortness of breath, CALL 911 and be transported to the hospital emergency room.  If you develope a fever above 101 F, pus (white drainage) or increased drainage or redness at the wound, or calf pain, call your surgeon's office.   Constipation Prevention    Complete by:  As directed   Drink plenty of fluids.  Prune juice may be helpful.  You may use a stool softener, such as Colace (over the counter) 100 mg twice a day.  Use MiraLax (over the counter) for constipation as needed.   Diet - low sodium heart healthy    Complete by:  As directed   Discharge instructions    Complete by:  As directed   Ice, elevate, keep bandage clean and dry for 48hrs then ok to remove and shower, Vicodin for pain, Must continue with Daily IV antibiotics. Follow up in office 1 week.   Increase activity slowly as tolerated    Complete by:  As directed      Follow-up Information    DALLDORF,PETER G, MD. Schedule an appointment as soon as possible for a visit on 08/09/2016.   Specialty:  Orthopedic Surgery Contact information: [place] [address] [place] .   Why:  Someone from [facility] will contact you to arrange start date and time for RN to come to your home for PICC Care.   Contact information: [address] [place] [address] [phone]            Signed: Rich Fuchs 08/23/2016, 2:42 PM

## 2016-08-23 NOTE — Discharge Summary (Signed)
Patient ID: [name] MRN: [id number] DOB/AGE: [birth year] 55 y.o.  Admit date: 08/06/2016 Discharge date: 08/09/2016  Admission Diagnoses:  Active Problems:   Septic joint of left knee joint (HCC)   Left knee pain   Discharge Diagnoses:  Same  Past Medical History:  Diagnosis Date  . Allergy   . Asthma    bronchitis  . Colon polyps   . GERD (gastroesophageal reflux disease)   . Hypertension   . Pleurisy    hx-12/13  . Rectal fissure   . Seasonal allergies   . Skin cancer     Surgeries: Procedure(s): IRRIGATION AND DEBRIDEMENT LEFT KNEE W/ SYNOVECTOMY on 08/06/2016   Consultants:   Discharged Condition: Improved  Hospital Course: [name] is an 55 y.o. male who was admitted 08/06/2016 for operative treatment of<principal problem not specified>. Patient has severe unremitting pain that affects sleep, daily activities, and work/hobbies. After pre-op clearance the patient was taken to the operating room on 08/06/2016 and underwent  Procedure(s): IRRIGATION AND DEBRIDEMENT LEFT KNEE W/ SYNOVECTOMY.    Patient was given perioperative antibiotics:  Anti-infectives    Start     Dose/Rate Route Frequency Ordered Stop   08/09/16 0000  vancomycin 1,500 mg in sodium chloride 0.9 % 500 mL    Comments:  Patient will need 6 week duration for BID dosing   1,500 mg 250 mL/hr over 120 Minutes Intravenous Every 12 hours 08/09/16 0946     08/08/16 0430  vancomycin (VANCOCIN) 1,500 mg in sodium chloride 0.9 % 500 mL IVPB  Status:  Discontinued     1,500 mg 250 mL/hr over 120 Minutes Intravenous Every 12 hours 08/07/16 1725 08/09/16 1916   08/06/16 2100  vancomycin (VANCOCIN) 1,500 mg in sodium chloride 0.9 % 500 mL IVPB  Status:  Discontinued     1,500 mg 250 mL/hr over 120 Minutes Intravenous Every 12 hours 08/06/16 1839 08/07/16 1725   08/06/16 1529  vancomycin (VANCOCIN) powder  Status:  Discontinued       As needed 08/06/16 1529 08/06/16 1704   08/06/16 1528  gentamicin  (GARAMYCIN) injection  Status:  Discontinued       As needed 08/06/16 1529 08/06/16 1704       Patient was given sequential compression devices, early ambulation, and chemoprophylaxis to prevent DVT.  Patient benefited maximally from hospital stay and there were no complications.    Recent vital signs: No data found.    Recent laboratory studies: No results for input(s): WBC, HGB, HCT, PLT, NA, K, CL, CO2, BUN, CREATININE, GLUCOSE, INR, CALCIUM in the last 72 hours.  Invalid input(s): PT, 2   Discharge Medications:     Medication List    TAKE these medications   amLODipine 10 MG tablet Commonly known as:  NORVASC Take 1 tablet (10 mg total) by mouth daily.   amphetamine-dextroamphetamine 30 MG tablet Commonly known as:  ADDERALL Take 30 mg by mouth daily as needed (ADHD).   bisacodyl 5 MG EC tablet Commonly known as:  DULCOLAX Take 5 mg by mouth 3 (three) times daily as needed for mild constipation.   calcium carbonate 500 MG chewable tablet Commonly known as:  TUMS - dosed in mg elemental calcium Chew 1-2 tablets by mouth as needed for heartburn.   cyclobenzaprine 10 MG tablet Commonly known as:  FLEXERIL Take 10 mg by mouth every 6 (six) hours as needed for muscle spasms.   fluticasone 50 MCG/ACT nasal spray Commonly known as:  FLONASE Place 2  sprays into both nostrils daily as needed for allergies.   hydrocortisone 2.5 % rectal cream Commonly known as:  ANUSOL-HC Place 1 application rectally daily as needed for hemorrhoids or itching.   lisinopril 20 MG tablet Commonly known as:  PRINIVIL,ZESTRIL Take 1 tablet (20 mg total) by mouth daily.   mupirocin cream 2 % Commonly known as:  BACTROBAN 1 application See admin instructions. Into both nostrils two times daily   OSTEO BI-FLEX JOINT SHIELD Tabs Take 1 tablet by mouth daily. Reported on 05/13/2016   oxyCODONE 20 mg 12 hr tablet Commonly known as:  OXYCONTIN Take 1 tablet (20 mg total) by mouth every 12  (twelve) hours.   oxyCODONE-acetaminophen 5-325 MG tablet Commonly known as:  PERCOCET/ROXICET Take 1-2 tablets by mouth every 6 (six) hours as needed for severe pain.   ranitidine 150 MG tablet Commonly known as:  ZANTAC Take 150 mg by mouth daily.   tetrahydrozoline-zinc 0.05-0.25 % ophthalmic solution Commonly known as:  VISINE-AC Place 2 drops into both eyes 3 (three) times daily as needed (for irritation or dry eyes).   vancomycin 1,500 mg in sodium chloride 0.9 % 500 mL Inject 1,500 mg into the vein every 12 (twelve) hours. Over a period of one hour What changed:  additional instructions       Diagnostic Studies: Mr Knee Left W Wo Contrast  Result Date: 08/05/2016 CLINICAL DATA:  Knee pain and fever.  Muscle spasms. EXAM: MRI OF THE LEFT KNEE WITHOUT AND WITH CONTRAST TECHNIQUE: Multiplanar, multisequence MR imaging of the knee was performed before and after the administration of intravenous contrast. CONTRAST:  92mL MULTIHANCE GADOBENATE DIMEGLUMINE 529 MG/ML IV SOLN COMPARISON:  07/29/2016 FINDINGS: Despite efforts by the technologist and patient, motion artifact is present on today's exam and could not be eliminated. This reduces exam sensitivity and specificity. MENISCI Medial meniscus: Much of the medial meniscus is diminutive. There seems to be a flap of tissue medial to the PCL for example on image 6/5, possibly a meniscal flap. Lateral meniscus: Grade 3 horizontal signal in the midbody lateral meniscus image 10/4. LIGAMENTS Cruciates:  The ACL graft is intact.  PCL intact. Collaterals:  Proximal popliteus tendinopathy. CARTILAGE Patellofemoral: Prominent degenerative chondral thinning. Marginal spurring in the patella. Medial: Prominent degenerative chondral thinning. Mild marginal spurring. Small degenerative subcortical foci of edema especially along the medial femoral condyle. Lateral: Prominent degenerative chondral thinning. Marginal spurring. Joint: Large knee effusion with  thickened enhancing synovium for example image 13/10. Amount of fluid in the joint approximately 80 cc. Popliteal Fossa: Small Baker's cyst. Mild distal semimembranosus tendinopathy. Extensor Mechanism: Prepatellar edema also tracks along the retinacula. Bones: Low-level edema and mild marrow enhancement in the lateral femoral condyle. Other: No supplemental non-categorized findings. IMPRESSION: 1. Large knee effusion (approximately 80 cc) with considerable synovial thickening and enhancement. Given the patient's recent knee surgery, this raises concern for the possibility of septic knee joint, and arthrocentesis or drainage may be warranted. 2. Small amount of edema and enhancement in the lateral femoral condyle, could well be reactive, not considered specific for osteomyelitis at this point, but may warrant monitoring. 3. Diminutive medial meniscus, questionable flap of tissue medial to the PCL could be a meniscal flap but is technically nonspecific. 4. Grade 3 horizontal signal in the midbody lateral meniscus, could be from tear or could be postoperative. 5. Proximal popliteus tendinopathy. 6. The ACL graft is intact. 7. Prominent degenerative chondral thinning in all 3 compartments of the knee. 8. Small Baker's cyst.  Mild distal semimembranosus tendinopathy. 9. Prepatellar edema. Electronically Signed   By: Van Clines M.D.   On: 08/05/2016 16:15   Dg Knee Complete 4 Views Left  Result Date: 07/29/2016 CLINICAL DATA:  Left knee surgery 2 weeks prior. Fluid build up requiring drainage twice in the last 2 weeks, last drainage yesterday. Increased swelling since that time. EXAM: LEFT KNEE - COMPLETE 4+ VIEW COMPARISON:  None. FINDINGS: Moderate to large suprapatellar joint effusion. No definite internal air. No acute osseous abnormalities. Prior ACL repair, anchors are in appropriate position. Medial tibial femoral joint space narrowing and peripheral spurring. Tiny patellofemoral osteophytes. No bony  destructive change. IMPRESSION: Moderate to large suprapatellar joint effusion. No acute osseous abnormality or bony destructive change. Electronically Signed   By: Jeb Levering M.D.   On: 07/29/2016 20:47    Disposition: 01-Home or Self Care  Discharge Instructions    Call MD / Call 911    Complete by:  As directed   If you experience chest pain or shortness of breath, CALL 911 and be transported to the hospital emergency room.  If you develope a fever above 101 F, pus (white drainage) or increased drainage or redness at the wound, or calf pain, call your surgeon's office.   Constipation Prevention    Complete by:  As directed   Drink plenty of fluids.  Prune juice may be helpful.  You may use a stool softener, such as Colace (over the counter) 100 mg twice a day.  Use MiraLax (over the counter) for constipation as needed.   Diet - low sodium heart healthy    Complete by:  As directed   Discharge instructions    Complete by:  As directed   Ice, elevate, weightbearing as tolerated, continue on 6 weeks or IV antibiotics. Follow up in office 08/13/16. Keep bandage clean and dry until follow up.   Increase activity slowly as tolerated    Complete by:  As directed      Follow-up Information    DALLDORF,PETER G, MD Follow up on 08/13/2016.   Specialty:  Orthopedic Surgery Contact information: [place] [address] [phone]            Signed: Rich Fuchs 08/23/2016, 2:43 PM

## 2016-08-24 DIAGNOSIS — M25662 Stiffness of left knee, not elsewhere classified: Secondary | ICD-10-CM | POA: Diagnosis not present

## 2016-08-24 DIAGNOSIS — M25562 Pain in left knee: Secondary | ICD-10-CM | POA: Diagnosis not present

## 2016-08-24 DIAGNOSIS — R262 Difficulty in walking, not elsewhere classified: Secondary | ICD-10-CM | POA: Diagnosis not present

## 2016-08-26 ENCOUNTER — Other Ambulatory Visit (HOSPITAL_COMMUNITY)
Admission: AD | Admit: 2016-08-26 | Discharge: 2016-08-26 | Disposition: A | Discharge status: Home / Self Care | Payer: BLUE CROSS/BLUE SHIELD | Source: Skilled Nursing Facility | Attending: Internal Medicine | Admitting: Internal Medicine

## 2016-08-26 DIAGNOSIS — Y831 Surgical operation with implant of artificial internal device as the cause of abnormal reaction of the patient, or of later complication, without mention of misadventure at the time of the procedure: Secondary | ICD-10-CM | POA: Insufficient documentation

## 2016-08-26 DIAGNOSIS — R262 Difficulty in walking, not elsewhere classified: Secondary | ICD-10-CM | POA: Diagnosis not present

## 2016-08-26 DIAGNOSIS — T8454XA Infection and inflammatory reaction due to internal left knee prosthesis, initial encounter: Secondary | ICD-10-CM | POA: Diagnosis not present

## 2016-08-26 DIAGNOSIS — M25662 Stiffness of left knee, not elsewhere classified: Secondary | ICD-10-CM | POA: Diagnosis not present

## 2016-08-26 DIAGNOSIS — M25562 Pain in left knee: Secondary | ICD-10-CM | POA: Diagnosis not present

## 2016-08-26 LAB — CBC WITH DIFFERENTIAL/PLATELET
BASOS PCT: 1 %
Basophils Absolute: 0.1 10*3/uL (ref 0.0–0.1)
Eosinophils Absolute: 0.4 10*3/uL (ref 0.0–0.7)
Eosinophils Relative: 4 %
HEMATOCRIT: 34.2 % — AB (ref 39.0–52.0)
Hemoglobin: 11.4 g/dL — ABNORMAL LOW (ref 13.0–17.0)
LYMPHS PCT: 22 %
Lymphs Abs: 2 10*3/uL (ref 0.7–4.0)
MCH: 28.8 pg (ref 26.0–34.0)
MCHC: 33.3 g/dL (ref 30.0–36.0)
MCV: 86.4 fL (ref 78.0–100.0)
MONO ABS: 0.9 10*3/uL (ref 0.1–1.0)
MONOS PCT: 10 %
NEUTROS ABS: 5.8 10*3/uL (ref 1.7–7.7)
Neutrophils Relative %: 63 %
Platelets: 322 10*3/uL (ref 150–400)
RBC: 3.96 MIL/uL — ABNORMAL LOW (ref 4.22–5.81)
RDW: 13.3 % (ref 11.5–15.5)
WBC: 9.1 10*3/uL (ref 4.0–10.5)

## 2016-08-26 LAB — BASIC METABOLIC PANEL
Anion gap: 11 (ref 5–15)
BUN: 16 mg/dL (ref 6–20)
CALCIUM: 10 mg/dL (ref 8.9–10.3)
CO2: 24 mmol/L (ref 22–32)
CREATININE: 0.97 mg/dL (ref 0.61–1.24)
Chloride: 99 mmol/L — ABNORMAL LOW (ref 101–111)
GFR calc Af Amer: >60 mL/min (ref >60)
GFR calc non Af Amer: >60 mL/min (ref >60)
GLUCOSE: 95 mg/dL (ref 65–99)
Potassium: 4.5 mmol/L (ref 3.5–5.1)
Sodium: 134 mmol/L — ABNORMAL LOW (ref 135–145)

## 2016-08-26 LAB — SEDIMENTATION RATE: Sed Rate: 45 mm/hr — ABNORMAL HIGH (ref 0–16)

## 2016-08-26 LAB — VANCOMYCIN, TROUGH: Vancomycin Tr: 23 ug/mL (ref 15–20)

## 2016-08-27 LAB — C-REACTIVE PROTEIN: CRP: 1.4 mg/dL — ABNORMAL HIGH (ref <1.0)

## 2016-08-28 DIAGNOSIS — T8454XA Infection and inflammatory reaction due to internal left knee prosthesis, initial encounter: Secondary | ICD-10-CM | POA: Diagnosis not present

## 2016-08-28 DIAGNOSIS — A4902 Methicillin resistant Staphylococcus aureus infection, unspecified site: Secondary | ICD-10-CM | POA: Diagnosis not present

## 2016-08-28 DIAGNOSIS — M25462 Effusion, left knee: Secondary | ICD-10-CM | POA: Diagnosis not present

## 2016-08-28 DIAGNOSIS — M01X62 Direct infection of left knee in infectious and parasitic diseases classified elsewhere: Secondary | ICD-10-CM | POA: Diagnosis not present

## 2016-08-31 ENCOUNTER — Encounter: Payer: Self-pay | Admitting: Internal Medicine

## 2016-08-31 DIAGNOSIS — Z452 Encounter for adjustment and management of vascular access device: Secondary | ICD-10-CM | POA: Diagnosis not present

## 2016-08-31 DIAGNOSIS — T8454XA Infection and inflammatory reaction due to internal left knee prosthesis, initial encounter: Secondary | ICD-10-CM | POA: Diagnosis not present

## 2016-09-01 DIAGNOSIS — M25562 Pain in left knee: Secondary | ICD-10-CM | POA: Diagnosis not present

## 2016-09-01 DIAGNOSIS — R262 Difficulty in walking, not elsewhere classified: Secondary | ICD-10-CM | POA: Diagnosis not present

## 2016-09-01 DIAGNOSIS — M25662 Stiffness of left knee, not elsewhere classified: Secondary | ICD-10-CM | POA: Diagnosis not present

## 2016-09-02 ENCOUNTER — Telehealth: Payer: Self-pay | Admitting: Internal Medicine

## 2016-09-02 NOTE — Telephone Encounter (Signed)
I received a phone call from the advanced home care nurse. [name] is receiving IV vancomycin for MRSA septic arthritis. He is having no problems infusing his vancomycin but his PICC has become clotted and they have been unable to draw blood today. He had blood work done earlier this week that is pending. [name] is apparently very frustrated and does not want to be stuck for a blood draw. He is due to see Dr. Tommy Medal in our clinic on 09/07/2016. I gave an order to to hold off on a blood draw today. If his blood work done earlier this week looks okay he can wait until he is seen in clinic next week.

## 2016-09-03 DIAGNOSIS — T8454XA Infection and inflammatory reaction due to internal left knee prosthesis, initial encounter: Secondary | ICD-10-CM | POA: Diagnosis not present

## 2016-09-04 DIAGNOSIS — A4902 Methicillin resistant Staphylococcus aureus infection, unspecified site: Secondary | ICD-10-CM | POA: Diagnosis not present

## 2016-09-04 DIAGNOSIS — M25462 Effusion, left knee: Secondary | ICD-10-CM | POA: Diagnosis not present

## 2016-09-04 DIAGNOSIS — T8454XA Infection and inflammatory reaction due to internal left knee prosthesis, initial encounter: Secondary | ICD-10-CM | POA: Diagnosis not present

## 2016-09-04 DIAGNOSIS — M01X62 Direct infection of left knee in infectious and parasitic diseases classified elsewhere: Secondary | ICD-10-CM | POA: Diagnosis not present

## 2016-09-06 DIAGNOSIS — M25562 Pain in left knee: Secondary | ICD-10-CM | POA: Diagnosis not present

## 2016-09-06 DIAGNOSIS — T8454XA Infection and inflammatory reaction due to internal left knee prosthesis, initial encounter: Secondary | ICD-10-CM | POA: Diagnosis not present

## 2016-09-06 DIAGNOSIS — Z452 Encounter for adjustment and management of vascular access device: Secondary | ICD-10-CM | POA: Diagnosis not present

## 2016-09-06 DIAGNOSIS — M25662 Stiffness of left knee, not elsewhere classified: Secondary | ICD-10-CM | POA: Diagnosis not present

## 2016-09-06 DIAGNOSIS — B999 Unspecified infectious disease: Secondary | ICD-10-CM | POA: Diagnosis not present

## 2016-09-06 DIAGNOSIS — R262 Difficulty in walking, not elsewhere classified: Secondary | ICD-10-CM | POA: Diagnosis not present

## 2016-09-07 ENCOUNTER — Ambulatory Visit (INDEPENDENT_AMBULATORY_CARE_PROVIDER_SITE_OTHER): Payer: BLUE CROSS/BLUE SHIELD | Admitting: Infectious Disease

## 2016-09-07 ENCOUNTER — Encounter: Payer: Self-pay | Admitting: Infectious Disease

## 2016-09-07 VITALS — BP 130/89 | HR 92 | Temp 98.2°F | Wt 185.6 lb

## 2016-09-07 DIAGNOSIS — M00062 Staphylococcal arthritis, left knee: Secondary | ICD-10-CM

## 2016-09-07 DIAGNOSIS — A4902 Methicillin resistant Staphylococcus aureus infection, unspecified site: Secondary | ICD-10-CM | POA: Diagnosis not present

## 2016-09-07 HISTORY — DX: Methicillin resistant Staphylococcus aureus infection, unspecified site: A49.02

## 2016-09-07 NOTE — Patient Instructions (Signed)
Lets do iV vancomycin through  September 21st and then DC the PICC

## 2016-09-07 NOTE — Progress Notes (Signed)
Subjective:   Chief complaint: shooting knee pains that occur at rest from time to time   Patient ID: [name], male    DOB: February 02, 1961, 55 y.o.   MRN: [id number]  HPI  55 year old Caucasian male with hx of prior knee surgery with two nails placed (that are still present) prior likely MRSA infection when he had "spider bite" that required I and D admitted with septic knee with MRSA sp arthroscopic surgery but with failure despite this surgery and abx sp open arthrotomy with placment of abx beads but not removal of these screws which he mentioned. He has largely felt better post surgery and is progressing with rehab. He has knee pain when working hard with rehab that then dissappears with rest. He also does have sharp fleeting pain in his knee that happens at rest and has been going on for the past three weeks.  His vancomycin became supra therapeutic yesterday and is being held. ESR and CRP are normal.  Past Medical History:  Diagnosis Date  . Allergy   . Asthma    bronchitis  . Colon polyps   . GERD (gastroesophageal reflux disease)   . Hypertension   . MRSA infection 09/07/2016  . Pleurisy    hx-12/13  . Rectal fissure   . Seasonal allergies   . Skin cancer     Past Surgical History:  Procedure Laterality Date  . APPENDECTOMY  2006   lap append  . CHOLECYSTECTOMY    . [place]  2005  . I&D EXTREMITY Left 07/30/2016   Procedure: left knee arthroscopic wash out;  Surgeon: Melrose Nakayama, MD;  Location: [place];  Service: Orthopedics;  Laterality: Left;  . I&D EXTREMITY Left 08/06/2016   Procedure: IRRIGATION AND DEBRIDEMENT LEFT KNEE W/ SYNOVECTOMY;  Surgeon: Melrose Nakayama, MD;  Location: [place];  Service: Orthopedics;  Laterality: Left;  . KNEE ARTHROSCOPY  2009   left  . KNEE ARTHROSCOPY Left 06/12/2013   Procedure: LEFT KNEE ARTHROSCOPY PARTIAL MEDIAL MENISECTOMY WITH CHONDROPLASTY;  Surgeon: Hessie Dibble, MD;  Location: [place];  Service:  Orthopedics;  Laterality: Left;  . KNEE SURGERY  2005   lt acl  . NASAL FRACTURE SURGERY  2012   closed reduction   . SHOULDER SURGERY  2010   left    Family History  Problem Relation Age of Onset  . Heart attack Mother   . Skin cancer Mother   . Hypertension Mother   . Hyperlipidemia Mother   . Stroke Mother   . Heart attack Father   . Bipolar disorder Maternal Grandmother   . Arthritis Maternal Grandmother   . Hyperlipidemia Maternal Grandmother   . Hypertension Maternal Grandmother   . Arthritis Maternal Grandfather   . Hyperlipidemia Maternal Grandfather   . Stroke Maternal Grandfather   . Hypertension Maternal Grandfather   . Arthritis Paternal Grandmother   . Hypertension Paternal Grandmother   . Arthritis Paternal Grandfather   . Hypertension Paternal Grandfather   . Hyperlipidemia Brother       Social History   Social History  . Marital status: Married    Spouse name: N/A  . Number of children: 5  . Years of education: 66   Occupational History  . landscaping [employer]   Social History Main Topics  . Smoking status: Former Smoker    Packs/day: 0.25    Years: 4.00    Types: Cigarettes, Cigars    Quit date: 12/27/1984  .  Smokeless tobacco: Never Used     Comment: occasional  . Alcohol use Yes     Comment: occ  . Drug use:     Frequency: 2.0 times per week    Types: Marijuana  . Sexual activity: Not Asked     Comment: occ cigars-quit cigs   Other Topics Concern  . None   Social History Narrative   Former Company secretary     Allergies  Allergen Reactions  . Codeine Itching, Nausea Only and Rash     Current Outpatient Prescriptions:  .  amLODipine (NORVASC) 10 MG tablet, Take 1 tablet (10 mg total) by mouth daily., Disp: 90 tablet, Rfl: 2 .  amphetamine-dextroamphetamine (ADDERALL) 30 MG tablet, Take 30 mg by mouth daily as needed (ADHD). , Disp: , Rfl:  .  calcium carbonate (TUMS - DOSED IN MG ELEMENTAL CALCIUM) 500 MG chewable tablet, Chew 1-2  tablets by mouth as needed for heartburn., Disp: , Rfl:  .  cyclobenzaprine (FLEXERIL) 10 MG tablet, Take 10 mg by mouth every 6 (six) hours as needed for muscle spasms. , Disp: , Rfl:  .  lisinopril (PRINIVIL,ZESTRIL) 20 MG tablet, Take 1 tablet (20 mg total) by mouth daily., Disp: 90 tablet, Rfl: 2 .  Misc Natural Products (OSTEO BI-FLEX JOINT SHIELD) TABS, Take 1 tablet by mouth daily. Reported on 05/13/2016, Disp: , Rfl:  .  mupirocin cream (BACTROBAN) 2 %, 1 application See admin instructions. Into both nostrils two times daily, Disp: , Rfl:  .  oxyCODONE (OXYCONTIN) 20 mg 12 hr tablet, Take 1 tablet (20 mg total) by mouth every 12 (twelve) hours., Disp: 20 tablet, Rfl: 0 .  oxyCODONE-acetaminophen (PERCOCET/ROXICET) 5-325 MG tablet, Take 1-2 tablets by mouth every 6 (six) hours as needed for severe pain., Disp: , Rfl:  .  ranitidine (ZANTAC) 150 MG tablet, Take 150 mg by mouth daily., Disp: , Rfl:  .  vancomycin 1,500 mg in sodium chloride 0.9 % 500 mL, Inject 1,500 mg into the vein every 12 (twelve) hours. Over a period of one hour, Disp: 84 Dose, Rfl: 0 .  bisacodyl (DULCOLAX) 5 MG EC tablet, Take 5 mg by mouth 3 (three) times daily as needed for mild constipation., Disp: , Rfl:  .  fluticasone (FLONASE) 50 MCG/ACT nasal spray, Place 2 sprays into both nostrils daily as needed for allergies. , Disp: , Rfl:  .  hydrocortisone (ANUSOL-HC) 2.5 % rectal cream, Place 1 application rectally daily as needed for hemorrhoids or itching., Disp: , Rfl:  .  tetrahydrozoline-zinc (VISINE-AC) 0.05-0.25 % ophthalmic solution, Place 2 drops into both eyes 3 (three) times daily as needed (for irritation or dry eyes). , Disp: , Rfl:     Review of Systems  Constitutional: Negative for chills, diaphoresis and fever.  HENT: Negative for congestion, hearing loss, sore throat and tinnitus.   Respiratory: Negative for cough, shortness of breath and wheezing.   Cardiovascular: Negative for chest pain, palpitations  and leg swelling.  Gastrointestinal: Negative for abdominal pain, blood in stool, constipation, diarrhea, nausea and vomiting.  Genitourinary: Negative for dysuria, flank pain and hematuria.  Musculoskeletal: Positive for joint swelling and myalgias. Negative for back pain.  Skin: Positive for color change. Negative for rash.  Neurological: Negative for dizziness, weakness and headaches.  Hematological: Does not bruise/bleed easily.  Psychiatric/Behavioral: Negative for suicidal ideas. The patient is not nervous/anxious.        Objective:   Physical Exam  Constitutional: He is oriented to person, place, and time.  He appears well-developed and well-nourished. No distress.  HENT:  Head: Normocephalic and atraumatic.  Mouth/Throat: No oropharyngeal exudate.  Eyes: Conjunctivae and EOM are normal. No scleral icterus.  Neck: Normal range of motion. Neck supple.  Cardiovascular: Normal rate and regular rhythm.   Pulmonary/Chest: Effort normal. No respiratory distress. He has no wheezes.  Abdominal: He exhibits no distension.  Musculoskeletal: He exhibits no edema.  Neurological: He is alert and oriented to person, place, and time. He exhibits normal muscle tone. Coordination normal.  Skin: Skin is warm and dry. No rash noted. He is not diaphoretic. No erythema.  Psychiatric: He has a normal mood and affect. His behavior is normal. Judgment and thought content normal.   Left knee 09/07/16: incision healed, slight warmth present     PICC line with minimal erythema surrounding entrance point 09/07/16:           Assessment & Plan:   MRSA septic arthritis that failed arthroscopic surgery and required open I and D.  I am bothered by 2 things;  Presence of these 2 screws that he mentioned have been there for past 10 years  And secondly his pain at rest  His clinical picture otherwise is re-assuring  I will have him finish almost 6 weeks of IV abx by letting him stop the day  before his birthday  He is to then rtc to see me in November   I spent greater than 40 minutes with the patient including greater than 50% of time in face to face counsel of the patient and his wife re his MRSA septic knee, hx of MRSA colonization and in coordination of his care.

## 2016-09-08 ENCOUNTER — Encounter: Payer: Self-pay | Admitting: Internal Medicine

## 2016-09-08 DIAGNOSIS — R262 Difficulty in walking, not elsewhere classified: Secondary | ICD-10-CM | POA: Diagnosis not present

## 2016-09-08 DIAGNOSIS — B999 Unspecified infectious disease: Secondary | ICD-10-CM | POA: Diagnosis not present

## 2016-09-08 DIAGNOSIS — T8454XA Infection and inflammatory reaction due to internal left knee prosthesis, initial encounter: Secondary | ICD-10-CM | POA: Diagnosis not present

## 2016-09-08 DIAGNOSIS — Z452 Encounter for adjustment and management of vascular access device: Secondary | ICD-10-CM | POA: Diagnosis not present

## 2016-09-08 DIAGNOSIS — M25662 Stiffness of left knee, not elsewhere classified: Secondary | ICD-10-CM | POA: Diagnosis not present

## 2016-09-08 DIAGNOSIS — M25562 Pain in left knee: Secondary | ICD-10-CM | POA: Diagnosis not present

## 2016-09-10 DIAGNOSIS — A4902 Methicillin resistant Staphylococcus aureus infection, unspecified site: Secondary | ICD-10-CM | POA: Diagnosis not present

## 2016-09-10 DIAGNOSIS — Z452 Encounter for adjustment and management of vascular access device: Secondary | ICD-10-CM | POA: Diagnosis not present

## 2016-09-10 DIAGNOSIS — M25462 Effusion, left knee: Secondary | ICD-10-CM | POA: Diagnosis not present

## 2016-09-10 DIAGNOSIS — T8454XA Infection and inflammatory reaction due to internal left knee prosthesis, initial encounter: Secondary | ICD-10-CM | POA: Diagnosis not present

## 2016-09-10 DIAGNOSIS — M01X62 Direct infection of left knee in infectious and parasitic diseases classified elsewhere: Secondary | ICD-10-CM | POA: Diagnosis not present

## 2016-09-11 DIAGNOSIS — A4902 Methicillin resistant Staphylococcus aureus infection, unspecified site: Secondary | ICD-10-CM | POA: Diagnosis not present

## 2016-09-11 DIAGNOSIS — T8454XA Infection and inflammatory reaction due to internal left knee prosthesis, initial encounter: Secondary | ICD-10-CM | POA: Diagnosis not present

## 2016-09-11 DIAGNOSIS — M25462 Effusion, left knee: Secondary | ICD-10-CM | POA: Diagnosis not present

## 2016-09-11 DIAGNOSIS — M01X62 Direct infection of left knee in infectious and parasitic diseases classified elsewhere: Secondary | ICD-10-CM | POA: Diagnosis not present

## 2016-09-13 ENCOUNTER — Encounter: Payer: Self-pay | Admitting: Internal Medicine

## 2016-09-13 DIAGNOSIS — D509 Iron deficiency anemia, unspecified: Secondary | ICD-10-CM | POA: Diagnosis not present

## 2016-09-13 DIAGNOSIS — M25662 Stiffness of left knee, not elsewhere classified: Secondary | ICD-10-CM | POA: Diagnosis not present

## 2016-09-13 DIAGNOSIS — M25562 Pain in left knee: Secondary | ICD-10-CM | POA: Diagnosis not present

## 2016-09-13 DIAGNOSIS — T8454XA Infection and inflammatory reaction due to internal left knee prosthesis, initial encounter: Secondary | ICD-10-CM | POA: Diagnosis not present

## 2016-09-13 DIAGNOSIS — R262 Difficulty in walking, not elsewhere classified: Secondary | ICD-10-CM | POA: Diagnosis not present

## 2016-09-15 DIAGNOSIS — M25562 Pain in left knee: Secondary | ICD-10-CM | POA: Diagnosis not present

## 2016-09-15 DIAGNOSIS — M25662 Stiffness of left knee, not elsewhere classified: Secondary | ICD-10-CM | POA: Diagnosis not present

## 2016-09-15 DIAGNOSIS — T8454XA Infection and inflammatory reaction due to internal left knee prosthesis, initial encounter: Secondary | ICD-10-CM | POA: Diagnosis not present

## 2016-09-15 DIAGNOSIS — R262 Difficulty in walking, not elsewhere classified: Secondary | ICD-10-CM | POA: Diagnosis not present

## 2016-09-22 DIAGNOSIS — M25562 Pain in left knee: Secondary | ICD-10-CM | POA: Diagnosis not present

## 2016-09-22 DIAGNOSIS — M25662 Stiffness of left knee, not elsewhere classified: Secondary | ICD-10-CM | POA: Diagnosis not present

## 2016-09-22 DIAGNOSIS — R262 Difficulty in walking, not elsewhere classified: Secondary | ICD-10-CM | POA: Diagnosis not present

## 2016-09-24 DIAGNOSIS — M25662 Stiffness of left knee, not elsewhere classified: Secondary | ICD-10-CM | POA: Diagnosis not present

## 2016-09-24 DIAGNOSIS — M25562 Pain in left knee: Secondary | ICD-10-CM | POA: Diagnosis not present

## 2016-09-24 DIAGNOSIS — R262 Difficulty in walking, not elsewhere classified: Secondary | ICD-10-CM | POA: Diagnosis not present

## 2016-09-27 DIAGNOSIS — M25562 Pain in left knee: Secondary | ICD-10-CM | POA: Diagnosis not present

## 2016-09-27 DIAGNOSIS — R262 Difficulty in walking, not elsewhere classified: Secondary | ICD-10-CM | POA: Diagnosis not present

## 2016-09-27 DIAGNOSIS — M25662 Stiffness of left knee, not elsewhere classified: Secondary | ICD-10-CM | POA: Diagnosis not present

## 2016-09-29 DIAGNOSIS — R262 Difficulty in walking, not elsewhere classified: Secondary | ICD-10-CM | POA: Diagnosis not present

## 2016-09-29 DIAGNOSIS — M25662 Stiffness of left knee, not elsewhere classified: Secondary | ICD-10-CM | POA: Diagnosis not present

## 2016-09-29 DIAGNOSIS — M25562 Pain in left knee: Secondary | ICD-10-CM | POA: Diagnosis not present

## 2016-10-11 DIAGNOSIS — R262 Difficulty in walking, not elsewhere classified: Secondary | ICD-10-CM | POA: Diagnosis not present

## 2016-10-11 DIAGNOSIS — M25562 Pain in left knee: Secondary | ICD-10-CM | POA: Diagnosis not present

## 2016-10-11 DIAGNOSIS — M25662 Stiffness of left knee, not elsewhere classified: Secondary | ICD-10-CM | POA: Diagnosis not present

## 2016-10-14 DIAGNOSIS — M25662 Stiffness of left knee, not elsewhere classified: Secondary | ICD-10-CM | POA: Diagnosis not present

## 2016-10-14 DIAGNOSIS — R262 Difficulty in walking, not elsewhere classified: Secondary | ICD-10-CM | POA: Diagnosis not present

## 2016-10-14 DIAGNOSIS — M25562 Pain in left knee: Secondary | ICD-10-CM | POA: Diagnosis not present

## 2016-10-18 DIAGNOSIS — M25562 Pain in left knee: Secondary | ICD-10-CM | POA: Diagnosis not present

## 2016-10-27 DIAGNOSIS — Z9889 Other specified postprocedural states: Secondary | ICD-10-CM | POA: Diagnosis not present

## 2016-10-27 DIAGNOSIS — R262 Difficulty in walking, not elsewhere classified: Secondary | ICD-10-CM | POA: Diagnosis not present

## 2016-10-27 DIAGNOSIS — M25562 Pain in left knee: Secondary | ICD-10-CM | POA: Diagnosis not present

## 2016-10-27 DIAGNOSIS — M25662 Stiffness of left knee, not elsewhere classified: Secondary | ICD-10-CM | POA: Diagnosis not present

## 2016-11-02 DIAGNOSIS — F902 Attention-deficit hyperactivity disorder, combined type: Secondary | ICD-10-CM | POA: Diagnosis not present

## 2016-11-02 DIAGNOSIS — F411 Generalized anxiety disorder: Secondary | ICD-10-CM | POA: Diagnosis not present

## 2016-11-02 DIAGNOSIS — Z79899 Other long term (current) drug therapy: Secondary | ICD-10-CM | POA: Diagnosis not present

## 2016-11-02 DIAGNOSIS — I10 Essential (primary) hypertension: Secondary | ICD-10-CM | POA: Diagnosis not present

## 2016-11-03 ENCOUNTER — Ambulatory Visit: Payer: BLUE CROSS/BLUE SHIELD | Admitting: Infectious Disease

## 2016-11-08 ENCOUNTER — Ambulatory Visit (INDEPENDENT_AMBULATORY_CARE_PROVIDER_SITE_OTHER): Payer: BLUE CROSS/BLUE SHIELD | Admitting: Infectious Disease

## 2016-11-08 VITALS — BP 141/89 | HR 79 | Temp 97.9°F | Ht 69.0 in | Wt 192.0 lb

## 2016-11-08 DIAGNOSIS — M00062 Staphylococcal arthritis, left knee: Secondary | ICD-10-CM | POA: Diagnosis not present

## 2016-11-08 DIAGNOSIS — Z23 Encounter for immunization: Secondary | ICD-10-CM

## 2016-11-08 DIAGNOSIS — A4902 Methicillin resistant Staphylococcus aureus infection, unspecified site: Secondary | ICD-10-CM

## 2016-11-08 DIAGNOSIS — M25562 Pain in left knee: Secondary | ICD-10-CM | POA: Diagnosis not present

## 2016-11-08 LAB — BASIC METABOLIC PANEL WITH GFR
BUN: 13 mg/dL (ref 7–25)
CALCIUM: 9.6 mg/dL (ref 8.6–10.3)
CHLORIDE: 105 mmol/L (ref 98–110)
CO2: 29 mmol/L (ref 20–31)
CREATININE: 1.02 mg/dL (ref 0.70–1.33)
GFR, Est African American: >89 mL/min (ref >=60)
GFR, Est Non African American: 82 mL/min (ref >=60)
GLUCOSE: 109 mg/dL — AB (ref 65–99)
Potassium: 5.3 mmol/L (ref 3.5–5.3)
Sodium: 140 mmol/L (ref 135–146)

## 2016-11-08 LAB — CBC WITH DIFFERENTIAL/PLATELET
BASOS ABS: 0 {cells}/uL (ref 0–200)
Basophils Relative: 0 %
EOS ABS: 168 {cells}/uL (ref 15–500)
Eosinophils Relative: 3 %
HEMATOCRIT: 43.7 % (ref 38.5–50.0)
HEMOGLOBIN: 14.4 g/dL (ref 13.2–17.1)
LYMPHS ABS: 2072 {cells}/uL (ref 850–3900)
Lymphocytes Relative: 37 %
MCH: 28.9 pg (ref 27.0–33.0)
MCHC: 33 g/dL (ref 32.0–36.0)
MCV: 87.6 fL (ref 80.0–100.0)
MONO ABS: 448 {cells}/uL (ref 200–950)
MPV: 9.1 fL (ref 7.5–12.5)
Monocytes Relative: 8 %
NEUTROS PCT: 52 %
Neutro Abs: 2912 cells/uL (ref 1500–7800)
Platelets: 271 10*3/uL (ref 140–400)
RBC: 4.99 MIL/uL (ref 4.20–5.80)
RDW: 16.5 % — ABNORMAL HIGH (ref 11.0–15.0)
WBC: 5.6 10*3/uL (ref 3.8–10.8)

## 2016-11-08 NOTE — Progress Notes (Signed)
Subjective:   Chief complaint: Follow-up for knee infection   Patient ID: [name], male    DOB: 11/20/1961, 55 y.o.   MRN: [id number]  HPI  54 year old Caucasian male with hx of prior knee surgery with two nails placed (that are still present) prior likely MRSA infection when he had "spider bite" that required I and D admitted with septic knee with MRSA sp arthroscopic surgery but with failure despite this surgery and a  bx sp open arthrotomy with placment of abx beads but not removal of these screws which he mentioned. He has largely felt better post surgery and is progressing with rehab  I last saw him he was having. knee pain when working hard with rehab that then dissappears with rest. He also was having sharp fleeting pain in his knee that happens at rest .   He has since finished his intravenous antibiotics and been off antibiotics for at least 3 weeks. His pain has continued to improve he occasionally does have pain with weightbearing and exertion but no pain at rest and he says he feels much much better.   Past Medical History:  Diagnosis Date  . Allergy   . Asthma    bronchitis  . Colon polyps   . GERD (gastroesophageal reflux disease)   . Hypertension   . MRSA infection 09/07/2016  . Pleurisy    hx-12/13  . Rectal fissure   . Seasonal allergies   . Skin cancer     Past Surgical History:  Procedure Laterality Date  . APPENDECTOMY  2006   lap append  . CHOLECYSTECTOMY    . [place]  2005  . I&D EXTREMITY Left 07/30/2016   Procedure: left knee arthroscopic wash out;  Surgeon: Melrose Nakayama, MD;  Location: [place];  Service: Orthopedics;  Laterality: Left;  . I&D EXTREMITY Left 08/06/2016   Procedure: IRRIGATION AND DEBRIDEMENT LEFT KNEE W/ SYNOVECTOMY;  Surgeon: Melrose Nakayama, MD;  Location: [place];  Service: Orthopedics;  Laterality: Left;  . KNEE ARTHROSCOPY  2009   left  . KNEE ARTHROSCOPY Left 06/12/2013   Procedure: LEFT KNEE ARTHROSCOPY  PARTIAL MEDIAL MENISECTOMY WITH CHONDROPLASTY;  Surgeon: Hessie Dibble, MD;  Location: [place];  Service: Orthopedics;  Laterality: Left;  . KNEE SURGERY  2005   lt acl  . NASAL FRACTURE SURGERY  2012   closed reduction   . SHOULDER SURGERY  2010   left    Family History  Problem Relation Age of Onset  . Heart attack Mother   . Skin cancer Mother   . Hypertension Mother   . Hyperlipidemia Mother   . Stroke Mother   . Heart attack Father   . Bipolar disorder Maternal Grandmother   . Arthritis Maternal Grandmother   . Hyperlipidemia Maternal Grandmother   . Hypertension Maternal Grandmother   . Arthritis Maternal Grandfather   . Hyperlipidemia Maternal Grandfather   . Stroke Maternal Grandfather   . Hypertension Maternal Grandfather   . Arthritis Paternal Grandmother   . Hypertension Paternal Grandmother   . Arthritis Paternal Grandfather   . Hypertension Paternal Grandfather   . Hyperlipidemia Brother       Social History   Social History  . Marital status: Married    Spouse name: N/A  . Number of children: 5  . Years of education: 32   Occupational History  . landscaping [employer]   Social History Main Topics  . Smoking status: Former Smoker  Packs/day: 0.25    Years: 4.00    Types: Cigarettes, Cigars    Quit date: 12/27/1984  . Smokeless tobacco: Never Used     Comment: occasional  . Alcohol use Yes     Comment: occ  . Drug use:     Frequency: 2.0 times per week    Types: Marijuana  . Sexual activity: Not on file     Comment: occ cigars-quit cigs   Other Topics Concern  . Not on file   Social History Narrative   Former Company secretary     Allergies  Allergen Reactions  . Codeine Itching, Nausea Only and Rash     Current Outpatient Prescriptions:  .  amLODipine (NORVASC) 10 MG tablet, Take 1 tablet (10 mg total) by mouth daily., Disp: 90 tablet, Rfl: 2 .  amphetamine-dextroamphetamine (ADDERALL) 30 MG tablet, Take 30 mg by mouth  daily as needed (ADHD). , Disp: , Rfl:  .  bisacodyl (DULCOLAX) 5 MG EC tablet, Take 5 mg by mouth 3 (three) times daily as needed for mild constipation., Disp: , Rfl:  .  calcium carbonate (TUMS - DOSED IN MG ELEMENTAL CALCIUM) 500 MG chewable tablet, Chew 1-2 tablets by mouth as needed for heartburn., Disp: , Rfl:  .  cyclobenzaprine (FLEXERIL) 10 MG tablet, Take 10 mg by mouth every 6 (six) hours as needed for muscle spasms. , Disp: , Rfl:  .  fluticasone (FLONASE) 50 MCG/ACT nasal spray, Place 2 sprays into both nostrils daily as needed for allergies. , Disp: , Rfl:  .  hydrocortisone (ANUSOL-HC) 2.5 % rectal cream, Place 1 application rectally daily as needed for hemorrhoids or itching., Disp: , Rfl:  .  lisinopril (PRINIVIL,ZESTRIL) 20 MG tablet, Take 1 tablet (20 mg total) by mouth daily., Disp: 90 tablet, Rfl: 2 .  Misc Natural Products (OSTEO BI-FLEX JOINT SHIELD) TABS, Take 1 tablet by mouth daily. Reported on 05/13/2016, Disp: , Rfl:  .  mupirocin cream (BACTROBAN) 2 %, 1 application See admin instructions. Into both nostrils two times daily, Disp: , Rfl:  .  oxyCODONE (OXYCONTIN) 20 mg 12 hr tablet, Take 1 tablet (20 mg total) by mouth every 12 (twelve) hours., Disp: 20 tablet, Rfl: 0 .  oxyCODONE-acetaminophen (PERCOCET/ROXICET) 5-325 MG tablet, Take 1-2 tablets by mouth every 6 (six) hours as needed for severe pain., Disp: , Rfl:  .  ranitidine (ZANTAC) 150 MG tablet, Take 150 mg by mouth daily., Disp: , Rfl:  .  tetrahydrozoline-zinc (VISINE-AC) 0.05-0.25 % ophthalmic solution, Place 2 drops into both eyes 3 (three) times daily as needed (for irritation or dry eyes). , Disp: , Rfl:  .  vancomycin 1,500 mg in sodium chloride 0.9 % 500 mL, Inject 1,500 mg into the vein every 12 (twelve) hours. Over a period of one hour, Disp: 84 Dose, Rfl: 0    Review of Systems  Constitutional: Negative for chills, diaphoresis and fever.  HENT: Negative for congestion, hearing loss, sore throat and  tinnitus.   Respiratory: Negative for cough, shortness of breath and wheezing.   Cardiovascular: Negative for chest pain, palpitations and leg swelling.  Gastrointestinal: Negative for abdominal pain, blood in stool, constipation, diarrhea, nausea and vomiting.  Genitourinary: Negative for dysuria, flank pain and hematuria.  Musculoskeletal: Positive for myalgias. Negative for back pain and joint swelling.  Skin: Negative for color change and rash.  Neurological: Negative for dizziness, weakness and headaches.  Hematological: Does not bruise/bleed easily.  Psychiatric/Behavioral: Negative for suicidal ideas. The patient is not  nervous/anxious.        Objective:   Physical Exam  Constitutional: He is oriented to person, place, and time. He appears well-developed and well-nourished. No distress.  HENT:  Head: Normocephalic and atraumatic.  Mouth/Throat: No oropharyngeal exudate.  Eyes: Conjunctivae and EOM are normal. No scleral icterus.  Neck: Normal range of motion. Neck supple.  Cardiovascular: Normal rate and regular rhythm.   Pulmonary/Chest: Effort normal. No respiratory distress. He has no wheezes.  Abdominal: He exhibits no distension.  Musculoskeletal: He exhibits no edema.  Neurological: He is alert and oriented to person, place, and time. He exhibits normal muscle tone. Coordination normal.  Skin: Skin is warm and dry. No rash noted. He is not diaphoretic. No erythema.  Psychiatric: He has a normal mood and affect. His behavior is normal. Judgment and thought content normal.   Left knee 09/07/16: incision healed, slight warmth present     Knee is nontender not warm and without an effusion today.       Assessment & Plan:   MRSA septic arthritis that failed arthroscopic surgery and required open I and D.  His clinical picture has improved since I last saw him we will check inflammatory markers today and if they are encouraging he can follow-up as needed.

## 2016-11-09 LAB — C-REACTIVE PROTEIN: CRP: 1.2 mg/L (ref <8.0)

## 2016-11-09 LAB — SEDIMENTATION RATE: SED RATE: 1 mm/h (ref 0–20)

## 2016-11-11 ENCOUNTER — Ambulatory Visit: Payer: BLUE CROSS/BLUE SHIELD | Admitting: Internal Medicine

## 2016-11-15 DIAGNOSIS — M25562 Pain in left knee: Secondary | ICD-10-CM | POA: Diagnosis not present

## 2016-12-06 DIAGNOSIS — M25562 Pain in left knee: Secondary | ICD-10-CM | POA: Diagnosis not present

## 2016-12-08 DIAGNOSIS — M1712 Unilateral primary osteoarthritis, left knee: Secondary | ICD-10-CM | POA: Diagnosis not present

## 2017-01-13 ENCOUNTER — Encounter: Payer: BLUE CROSS/BLUE SHIELD | Admitting: Family

## 2017-01-17 ENCOUNTER — Encounter: Payer: Self-pay | Admitting: Family

## 2017-01-17 ENCOUNTER — Ambulatory Visit (INDEPENDENT_AMBULATORY_CARE_PROVIDER_SITE_OTHER): Payer: BLUE CROSS/BLUE SHIELD | Admitting: Family

## 2017-01-17 VITALS — BP 168/100 | HR 95 | Temp 99.4°F | Resp 18 | Ht 69.0 in | Wt 194.0 lb

## 2017-01-17 DIAGNOSIS — J40 Bronchitis, not specified as acute or chronic: Secondary | ICD-10-CM

## 2017-01-17 DIAGNOSIS — I10 Essential (primary) hypertension: Secondary | ICD-10-CM | POA: Diagnosis not present

## 2017-01-17 LAB — POCT INFLUENZA A/B
INFLUENZA A, POC: NEGATIVE
Influenza B, POC: NEGATIVE

## 2017-01-17 MED ORDER — METHYLPREDNISOLONE ACETATE 80 MG/ML IJ SUSP
80.0000 mg | Freq: Once | INTRAMUSCULAR | Status: AC
  Administered 2017-01-17: 80 mg via INTRAMUSCULAR

## 2017-01-17 MED ORDER — AZITHROMYCIN 250 MG PO TABS: ORAL_TABLET | ORAL | 0 refills | Status: DC

## 2017-01-17 NOTE — Assessment & Plan Note (Signed)
Symptoms and exam consistent with bronchitis. In office influenza test negative. In office injection of 80 mg of Depo-Medrol provided. Continue supportive care with over-the-counter medications as needed for symptom relief. If symptoms worsen written prescription for azithromycin provided with instructions for watchful waiting and starting antibiotic.

## 2017-01-17 NOTE — Assessment & Plan Note (Signed)
Blood pressure elevated today above goal 140/90 with current regimen. Notes has not taken medication in the last 2 days secondary to not feeling well and nausea. Denies worst headache of life with no new symptoms of end organ damage. Continue current dosage of amlodipine and lisinopril. Monitor blood pressure at home and follow sodium diet.

## 2017-01-17 NOTE — Patient Instructions (Signed)
Thank you for choosing [facility].  SUMMARY AND INSTRUCTIONS:  Continue with the Mucinex.   Drink plenty of fluids.   If your symptoms worsen start the antibiotic.   Medication:  Your prescription(s) have been submitted to your pharmacy or been printed and provided for you. Please take as directed and contact our office if you believe you are having problem(s) with the medication(s) or have any questions.   Follow up:  If your symptoms worsen or fail to improve, please contact our office for further instruction, or in case of emergency go directly to the emergency room at the closest medical facility.    General Recommendations:    Please drink plenty of fluids.  Get plenty of rest   Sleep in humidified air  Use saline nasal sprays  Netti pot   OTC Medications:  Decongestants - helps relieve congestion   Flonase (generic fluticasone) or Nasacort (generic triamcinolone) - please make sure to use the "cross-over" technique at a 45 degree angle towards the opposite eye as opposed to straight up the nasal passageway.   Sudafed (generic pseudoephedrine - Note this is the one that is available behind the pharmacy counter); Products with phenylephrine (-PE) may also be used but is often not as effective as pseudoephedrine.   If you have HIGH BLOOD PRESSURE - Coricidin HBP; AVOID any product that is -D as this contains pseudoephedrine which may increase your blood pressure.  Afrin (oxymetazoline) every 6-8 hours for up to 3 days.   Allergies - helps relieve runny nose, itchy eyes and sneezing   Claritin (generic loratidine), Allegra (fexofenidine), or Zyrtec (generic cyrterizine) for runny nose. These medications should not cause drowsiness.  Note - Benadryl (generic diphenhydramine) may be used however may cause drowsiness  Cough -   Delsym or Robitussin (generic dextromethorphan)  Expectorants - helps loosen mucus to ease removal   Mucinex (generic  guaifenesin) as directed on the package.  Headaches / General Aches   Tylenol (generic acetaminophen) - DO NOT EXCEED 3 grams (3,000 mg) in a 24 hour time period  Advil/Motrin (generic ibuprofen)   Sore Throat -   Salt water gargle   Chloraseptic (generic benzocaine) spray or lozenges / Sucrets (generic dyclonine)

## 2017-01-17 NOTE — Progress Notes (Signed)
Subjective:    Patient ID: [name], male    DOB: [birth year], 56 y.o.   MRN: [id number]  Chief Complaint  Patient presents with  . Nasal Congestion    congestion, cough, body aches, weak and no energy    HPI:  [name] is a 56 y.o. male who  has a past medical history of Allergy; Asthma; Colon polyps; GERD (gastroesophageal reflux disease); Hypertension; MRSA infection (09/07/2016); Pleurisy; Rectal fissure; Seasonal allergies; and Skin cancer. and presents today for an acute office visit.  This is a new problem. Associated symptoms of congestion, cough, body aches, weakness and decreased energy has been going on for about 4-5 days. No fevers at home. Modifying factors include Alkaseltzer cold/flu and Mucinex which have helped a little. Over the course of the 4 days reports that he was feeling a little better and then started feeling worse this morning.   Allergies  Allergen Reactions  . Codeine Itching, Nausea Only and Rash      Outpatient Medications Prior to Visit  Medication Sig Dispense Refill  . amLODipine (NORVASC) 10 MG tablet Take 1 tablet (10 mg total) by mouth daily. 90 tablet 2  . calcium carbonate (TUMS - DOSED IN MG ELEMENTAL CALCIUM) 500 MG chewable tablet Chew 1-2 tablets by mouth as needed for heartburn.    . fluticasone (FLONASE) 50 MCG/ACT nasal spray Place 2 sprays into both nostrils daily as needed for allergies.     [name] lisinopril (PRINIVIL,ZESTRIL) 20 MG tablet Take 1 tablet (20 mg total) by mouth daily. 90 tablet 2  . mupirocin cream (BACTROBAN) 2 % 1 application See admin instructions. Into both nostrils two times daily    . ranitidine (ZANTAC) 150 MG tablet Take 150 mg by mouth daily.    [name] tetrahydrozoline-zinc (VISINE-AC) 0.05-0.25 % ophthalmic solution Place 2 drops into both eyes 3 (three) times daily as needed (for irritation or dry eyes).     [name] amphetamine-dextroamphetamine (ADDERALL) 30 MG tablet Take 30 mg by mouth daily as needed (ADHD).      [name] bisacodyl (DULCOLAX) 5 MG EC tablet Take 5 mg by mouth 3 (three) times daily as needed for mild constipation.    . cyclobenzaprine (FLEXERIL) 10 MG tablet Take 10 mg by mouth every 6 (six) hours as needed for muscle spasms.     . hydrocortisone (ANUSOL-HC) 2.5 % rectal cream Place 1 application rectally daily as needed for hemorrhoids or itching.    . Misc Natural Products (OSTEO BI-FLEX JOINT SHIELD) TABS Take 1 tablet by mouth daily. Reported on 05/13/2016    . oxyCODONE (OXYCONTIN) 20 mg 12 hr tablet Take 1 tablet (20 mg total) by mouth every 12 (twelve) hours. 20 tablet 0  . oxyCODONE-acetaminophen (PERCOCET/ROXICET) 5-325 MG tablet Take 1-2 tablets by mouth every 6 (six) hours as needed for severe pain.     No facility-administered medications prior to visit.       Past Surgical History:  Procedure Laterality Date  . APPENDECTOMY  2006   lap append  . CHOLECYSTECTOMY    . [place]  2005  . I&D EXTREMITY Left 07/30/2016   Procedure: left knee arthroscopic wash out;  Surgeon: Melrose Nakayama, MD;  Location: [facility];  Service: Orthopedics;  Laterality: Left;  . I&D EXTREMITY Left 08/06/2016   Procedure: IRRIGATION AND DEBRIDEMENT LEFT KNEE W/ SYNOVECTOMY;  Surgeon: Melrose Nakayama, MD;  Location: [facility];  Service: Orthopedics;  Laterality: Left;  . KNEE ARTHROSCOPY  2009  left  . KNEE ARTHROSCOPY Left 06/12/2013   Procedure: LEFT KNEE ARTHROSCOPY PARTIAL MEDIAL MENISECTOMY WITH CHONDROPLASTY;  Surgeon: Hessie Dibble, MD;  Location: [facility];  Service: Orthopedics;  Laterality: Left;  . KNEE SURGERY  2005   lt acl  . NASAL FRACTURE SURGERY  2012   closed reduction   . SHOULDER SURGERY  2010   left      Past Medical History:  Diagnosis Date  . Allergy   . Asthma    bronchitis  . Colon polyps   . GERD (gastroesophageal reflux disease)   . Hypertension   . MRSA infection 09/07/2016  . Pleurisy    hx-12/13  . Rectal fissure   . Seasonal allergies    . Skin cancer       Review of Systems  Constitutional: Positive for chills and fatigue. Negative for fever.  HENT: Positive for congestion, sinus pain and sinus pressure. Negative for sore throat.   Respiratory: Positive for cough, chest tightness and shortness of breath.   Gastrointestinal: Positive for nausea.  Neurological: Positive for weakness. Negative for headaches.      Objective:    BP (!) 168/100 (BP Location: Left Arm, Patient Position: Sitting, Cuff Size: Large)   Pulse 95   Temp 99.4 F (37.4 C) (Oral)   Resp 18   Ht 5\' 9"  (1.753 m)   Wt 194 lb (88 kg)   SpO2 97%   BMI 28.65 kg/m  Nursing note and vital signs reviewed.  Physical Exam  Constitutional: He is oriented to person, place, and time. He appears well-developed and well-nourished. No distress.  HENT:  Right Ear: Hearing, tympanic membrane, external ear and ear canal normal.  Left Ear: Hearing, tympanic membrane, external ear and ear canal normal.  Nose: Right sinus exhibits no maxillary sinus tenderness and no frontal sinus tenderness. Left sinus exhibits no maxillary sinus tenderness and no frontal sinus tenderness.  Mouth/Throat: Uvula is midline, oropharynx is clear and moist and mucous membranes are normal.  Cardiovascular: Normal rate, regular rhythm, normal heart sounds and intact distal pulses.   Pulmonary/Chest: Effort normal and breath sounds normal. No respiratory distress. He has no wheezes. He has no rales. He exhibits no tenderness.  Neurological: He is alert and oriented to person, place, and time.  Skin: Skin is warm and dry.  Psychiatric: He has a normal mood and affect. His behavior is normal. Judgment and thought content normal.       Assessment & Plan:   Problem List Items Addressed This Visit      Cardiovascular and Mediastinum   Essential hypertension    Blood pressure elevated today above goal 140/90 with current regimen. Notes has not taken medication in the last 2 days  secondary to not feeling well and nausea. Denies worst headache of life with no new symptoms of end organ damage. Continue current dosage of amlodipine and lisinopril. Monitor blood pressure at home and follow sodium diet.        Respiratory   Bronchitis - Primary    Symptoms and exam consistent with bronchitis. In office influenza test negative. In office injection of 80 mg of Depo-Medrol provided. Continue supportive care with over-the-counter medications as needed for symptom relief. If symptoms worsen written prescription for azithromycin provided with instructions for watchful waiting and starting antibiotic.      Relevant Medications   methylPREDNISolone acetate (DEPO-MEDROL) injection 80 mg (Completed)   Other Relevant Orders   POCT Influenza A/B (Completed)  I have discontinued Mr. Palin OSTEO BI-FLEX JOINT SHIELD, amphetamine-dextroamphetamine, cyclobenzaprine, bisacodyl, oxyCODONE-acetaminophen, hydrocortisone, and oxyCODONE. I am also having him start on azithromycin. Additionally, I am having him maintain his calcium carbonate, fluticasone, amLODipine, lisinopril, ranitidine, mupirocin cream, and tetrahydrozoline-zinc. We administered methylPREDNISolone acetate.   Meds ordered this encounter  Medications  . azithromycin (ZITHROMAX) 250 MG tablet    Sig: Take 2 tablets by mouth for 1 day and then 1 tablet by mouth daily for 4 days.    Dispense:  6 tablet    Refill:  0    Order Specific Question:   Supervising Provider    Answer:   Pricilla Holm A [id number]  . methylPREDNISolone acetate (DEPO-MEDROL) injection 80 mg     Follow-up: Return if symptoms worsen or fail to improve.  Mauricio Po, FNP

## 2017-02-23 ENCOUNTER — Other Ambulatory Visit: Payer: Self-pay | Admitting: *Deleted

## 2017-02-23 DIAGNOSIS — I10 Essential (primary) hypertension: Secondary | ICD-10-CM

## 2017-02-23 MED ORDER — LISINOPRIL 20 MG PO TABS: 20.0000 mg | ORAL_TABLET | Freq: Every day | ORAL | 1 refills | Status: DC

## 2017-02-23 MED ORDER — AMLODIPINE BESYLATE 10 MG PO TABS
10.0000 mg | ORAL_TABLET | Freq: Every day | ORAL | 1 refills | Status: DC
Start: 2017-02-23 — End: 2017-08-30

## 2017-07-07 ENCOUNTER — Encounter: Payer: Self-pay | Admitting: Family

## 2017-07-07 ENCOUNTER — Ambulatory Visit (INDEPENDENT_AMBULATORY_CARE_PROVIDER_SITE_OTHER): Payer: BLUE CROSS/BLUE SHIELD | Admitting: Family

## 2017-07-07 ENCOUNTER — Other Ambulatory Visit (INDEPENDENT_AMBULATORY_CARE_PROVIDER_SITE_OTHER): Payer: BLUE CROSS/BLUE SHIELD

## 2017-07-07 VITALS — BP 128/84 | HR 90 | Temp 98.1°F | Resp 16 | Ht 64.0 in | Wt 184.0 lb

## 2017-07-07 DIAGNOSIS — Z0001 Encounter for general adult medical examination with abnormal findings: Secondary | ICD-10-CM | POA: Diagnosis not present

## 2017-07-07 LAB — COMPREHENSIVE METABOLIC PANEL
ALBUMIN: 4.8 g/dL (ref 3.5–5.2)
ALT: 25 U/L (ref 0–53)
AST: 16 U/L (ref 0–37)
Alkaline Phosphatase: 42 U/L (ref 39–117)
BUN: 18 mg/dL (ref 6–23)
CALCIUM: 10.4 mg/dL (ref 8.4–10.5)
CHLORIDE: 101 meq/L (ref 96–112)
CO2: 29 mEq/L (ref 19–32)
Creatinine, Ser: 1.03 mg/dL (ref 0.40–1.50)
GFR: 79.17 mL/min (ref >60.00)
Glucose, Bld: 124 mg/dL — ABNORMAL HIGH (ref 70–99)
Potassium: 4.6 mEq/L (ref 3.5–5.1)
Sodium: 137 mEq/L (ref 135–145)
Total Bilirubin: 0.6 mg/dL (ref 0.2–1.2)
Total Protein: 7.2 g/dL (ref 6.0–8.3)

## 2017-07-07 LAB — CBC
HEMATOCRIT: 43.1 % (ref 39.0–52.0)
Hemoglobin: 14.6 g/dL (ref 13.0–17.0)
MCHC: 33.8 g/dL (ref 30.0–36.0)
MCV: 90.3 fl (ref 78.0–100.0)
PLATELETS: 328 10*3/uL (ref 150.0–400.0)
RBC: 4.78 Mil/uL (ref 4.22–5.81)
RDW: 13.8 % (ref 11.5–15.5)
WBC: 11.8 10*3/uL — ABNORMAL HIGH (ref 4.0–10.5)

## 2017-07-07 LAB — LIPID PANEL
CHOLESTEROL: 194 mg/dL (ref 0–200)
HDL: 63 mg/dL (ref >39.00)
LDL CALC: 91 mg/dL (ref 0–99)
NonHDL: 130.62
TRIGLYCERIDES: 197 mg/dL — AB (ref 0.0–149.0)
Total CHOL/HDL Ratio: 3
VLDL: 39.4 mg/dL (ref 0.0–40.0)

## 2017-07-07 LAB — PSA: PSA: 1.02 ng/mL (ref 0.10–4.00)

## 2017-07-07 MED ORDER — HYDROCORTISONE 2.5 % RE CREA: 1.0000 "application " | TOPICAL_CREAM | Freq: Two times a day (BID) | RECTAL | 0 refills | Status: DC

## 2017-07-07 MED ORDER — PANTOPRAZOLE SODIUM 40 MG PO TBEC: 40.0000 mg | DELAYED_RELEASE_TABLET | Freq: Every day | ORAL | 3 refills | Status: DC

## 2017-07-07 NOTE — Patient Instructions (Signed)
Thank you for choosing [facility].  SUMMARY AND INSTRUCTIONS:  Please continue to take your medications as prescribed.   We will follow up on your blood work.  Work on cutting back the caffeine intake.   Food choices for reflux are below.   Start the pantoprazole for reflux and continue the Zantac as needed.   Good luck selling your house!  Medication:  Your prescription(s) have been submitted to your pharmacy or been printed and provided for you. Please take as directed and contact our office if you believe you are having problem(s) with the medication(s) or have any questions.  Labs:  Please stop by the lab on the lower level of the building for your blood work. Your results will be released to [facility] (or called to you) after review, usually within 72 hours after test completion. If any changes need to be made, you will be notified at that same time.  1.) The lab is open from 7:30am to 5:30 pm Monday-Friday 2.) No appointment is necessary 3.) Fasting (if needed) is 6-8 hours after food and drink; black coffee and water are okay   Follow up:  If your symptoms worsen or fail to improve, please contact our office for further instruction, or in case of emergency go directly to the emergency room at the closest medical facility.    Health Maintenance, Male A healthy lifestyle and preventive care is important for your health and wellness. Ask your health care provider about what schedule of regular examinations is right for you. What should I know about weight and diet? Eat a Healthy Diet  Eat plenty of vegetables, fruits, whole grains, low-fat dairy products, and lean protein.  Do not eat a lot of foods high in solid fats, added sugars, or salt.  Maintain a Healthy Weight Regular exercise can help you achieve or maintain a healthy weight. You should:  Do at least 150 minutes of exercise each week. The exercise should increase your heart rate and make you sweat  (moderate-intensity exercise).  Do strength-training exercises at least twice a week.  Watch Your Levels of Cholesterol and Blood Lipids  Have your blood tested for lipids and cholesterol every 5 years starting at 57 years of age. If you are at high risk for heart disease, you should start having your blood tested when you are 57 years old. You may need to have your cholesterol levels checked more often if: ? Your lipid or cholesterol levels are high. ? You are older than 57 years of age. ? You are at high risk for heart disease.  What should I know about cancer screening? Many types of cancers can be detected early and may often be prevented. Lung Cancer  You should be screened every year for lung cancer if: ? You are a current smoker who has smoked for at least 30 years. ? You are a former smoker who has quit within the past 15 years.  Talk to your health care provider about your screening options, when you should start screening, and how often you should be screened.  Colorectal Cancer  Routine colorectal cancer screening usually begins at 57 years of age and should be repeated every 5-10 years until you are 57 years old. You may need to be screened more often if early forms of precancerous polyps or small growths are found. Your health care provider may recommend screening at an earlier age if you have risk factors for colon cancer.  Your health care provider may  recommend using home test kits to check for hidden blood in the stool.  A small camera at the end of a tube can be used to examine your colon (sigmoidoscopy or colonoscopy). This checks for the earliest forms of colorectal cancer.  Prostate and Testicular Cancer  Depending on your age and overall health, your health care provider may do certain tests to screen for prostate and testicular cancer.  Talk to your health care provider about any symptoms or concerns you have about testicular or prostate cancer.  Skin  Cancer  Check your skin from head to toe regularly.  Tell your health care provider about any new moles or changes in moles, especially if: ? There is a change in a mole's size, shape, or color. ? You have a mole that is larger than a pencil eraser.  Always use sunscreen. Apply sunscreen liberally and repeat throughout the day.  Protect yourself by wearing long sleeves, pants, a wide-brimmed hat, and sunglasses when outside.  What should I know about heart disease, diabetes, and high blood pressure?  If you are 11-66 years of age, have your blood pressure checked every 3-5 years. If you are 37 years of age or older, have your blood pressure checked every year. You should have your blood pressure measured twice-once when you are at a hospital or clinic, and once when you are not at a hospital or clinic. Record the average of the two measurements. To check your blood pressure when you are not at a hospital or clinic, you can use: ? An automated blood pressure machine at a pharmacy. ? A home blood pressure monitor.  Talk to your health care provider about your target blood pressure.  If you are between 27-38 years old, ask your health care provider if you should take aspirin to prevent heart disease.  Have regular diabetes screenings by checking your fasting blood sugar level. ? If you are at a normal weight and have a low risk for diabetes, have this test once every three years after the age of 74. ? If you are overweight and have a high risk for diabetes, consider being tested at a younger age or more often.  A one-time screening for abdominal aortic aneurysm (AAA) by ultrasound is recommended for men aged 56-75 years who are current or former smokers. What should I know about preventing infection? Hepatitis B If you have a higher risk for hepatitis B, you should be screened for this virus. Talk with your health care provider to find out if you are at risk for hepatitis B  infection. Hepatitis C Blood testing is recommended for:  Everyone born from [birth year] through 1966.  Anyone with known risk factors for hepatitis C.  Sexually Transmitted Diseases (STDs)  You should be screened each year for STDs including gonorrhea and chlamydia if: ? You are sexually active and are younger than 57 years of age. ? You are older than 57 years of age and your health care provider tells you that you are at risk for this type of infection. ? Your sexual activity has changed since you were last screened and you are at an increased risk for chlamydia or gonorrhea. Ask your health care provider if you are at risk.  Talk with your health care provider about whether you are at high risk of being infected with HIV. Your health care provider may recommend a prescription medicine to help prevent HIV infection.  What else can I do?  Schedule regular health, dental,  and eye exams.  Stay current with your vaccines (immunizations).  Do not use any tobacco products, such as cigarettes, chewing tobacco, and e-cigarettes. If you need help quitting, ask your health care provider.  Limit alcohol intake to no more than 2 drinks per day. One drink equals 12 ounces of beer, 5 ounces of wine, or 1 ounces of hard liquor.  Do not use street drugs.  Do not share needles.  Ask your health care provider for help if you need support or information about quitting drugs.  Tell your health care provider if you often feel depressed.  Tell your health care provider if you have ever been abused or do not feel safe at home. This information is not intended to replace advice given to you by your health care provider. Make sure you discuss any questions you have with your health care provider. Document Released: 06/10/2008 Document Revised: 08/11/2016 Document Reviewed: 09/16/2015 Elsevier Interactive Patient Education  2018 [employer] for Gastroesophageal Reflux Disease,  Adult When you have gastroesophageal reflux disease (GERD), the foods you eat and your eating habits are very important. Choosing the right foods can help ease your discomfort. What guidelines do I need to follow?  Choose fruits, vegetables, whole grains, and low-fat dairy products.  Choose low-fat meat, fish, and poultry.  Limit fats such as oils, salad dressings, butter, nuts, and avocado.  Keep a food diary. This helps you identify foods that cause symptoms.  Avoid foods that cause symptoms. These may be different for everyone.  Eat small meals often instead of 3 large meals a day.  Eat your meals slowly, in a place where you are relaxed.  Limit fried foods.  Cook foods using methods other than frying.  Avoid drinking alcohol.  Avoid drinking large amounts of liquids with your meals.  Avoid bending over or lying down until 2-3 hours after eating. What foods are not recommended? These are some foods and drinks that may make your symptoms worse: Vegetables Tomatoes. Tomato juice. Tomato and spaghetti sauce. Chili peppers. Onion and garlic. Horseradish. Fruits Oranges, grapefruit, and lemon (fruit and juice). Meats High-fat meats, fish, and poultry. This includes hot dogs, ribs, ham, sausage, salami, and bacon. Dairy Whole milk and chocolate milk. Sour cream. Cream. Butter. Ice cream. Cream cheese. Drinks Coffee and tea. Bubbly (carbonated) drinks or energy drinks. Condiments Hot sauce. Barbecue sauce. Sweets/Desserts Chocolate and cocoa. Donuts. Peppermint and spearmint. Fats and Oils High-fat foods. This includes [place] fries and potato chips. Other Vinegar. Strong spices. This includes black pepper, white pepper, red pepper, cayenne, curry powder, cloves, ginger, and chili powder. The items listed above may not be a complete list of foods and drinks to avoid. Contact your dietitian for more information. This information is not intended to replace advice given to you  by your health care provider. Make sure you discuss any questions you have with your health care provider. Document Released: 06/13/2012 Document Revised: 05/20/2016 Document Reviewed: 10/17/2013 Elsevier Interactive Patient Education  2017 [employer].

## 2017-07-07 NOTE — Progress Notes (Signed)
Subjective:    Patient ID: [name], male    DOB: Jul 13, 1961, 56 y.o.   MRN: [id number]  Chief Complaint  Patient presents with  . CPE    not fasting    HPI:  HELAMAN Smith is a 56 y.o. male who presents today for an annual wellness visit.   1) Health Maintenance -   Diet - Averages about 2-3 meals per day consisting of a regular diet; Caffeine intake of 4-6 cups daily.   Exercise - Work outside and working to be back in [employer]   2) Publishing rights manager / Immunizations:  Dental -- Up to date  Vision -- Due for exam   Health Maintenance  Topic Date Due  . INFLUENZA VACCINE  07/27/2017  . COLONOSCOPY  09/14/2024  . TETANUS/TDAP  12/14/2024  . Hepatitis C Screening  Completed  . HIV Screening  Completed    Immunization History  Administered Date(s) Administered  . Influenza,inj,Quad PF,36+ Mos 11/08/2016  . Pneumococcal Polysaccharide-23 08/01/2016     Allergies  Allergen Reactions  . Codeine Itching, Nausea Only and Rash     Outpatient Medications Prior to Visit  Medication Sig Dispense Refill  . amLODipine (NORVASC) 10 MG tablet Take 1 tablet (10 mg total) by mouth daily. 90 tablet 1  . calcium carbonate (TUMS - DOSED IN MG ELEMENTAL CALCIUM) 500 MG chewable tablet Chew 1-2 tablets by mouth as needed for heartburn.    . fluticasone (FLONASE) 50 MCG/ACT nasal spray Place 2 sprays into both nostrils daily as needed for allergies.     [name] lisinopril (PRINIVIL,ZESTRIL) 20 MG tablet Take 1 tablet (20 mg total) by mouth daily. 90 tablet 1  . mupirocin cream (BACTROBAN) 2 % 1 application See admin instructions. Into both nostrils two times daily    . ranitidine (ZANTAC) 150 MG tablet Take 150 mg by mouth daily.    [name] tetrahydrozoline-zinc (VISINE-AC) 0.05-0.25 % ophthalmic solution Place 2 drops into both eyes 3 (three) times daily as needed (for irritation or dry eyes).     [name] azithromycin (ZITHROMAX) 250 MG tablet Take 2 tablets by mouth for 1 day and then  1 tablet by mouth daily for 4 days. 6 tablet 0   No facility-administered medications prior to visit.      Past Medical History:  Diagnosis Date  . Allergy   . Asthma    bronchitis  . Colon polyps   . GERD (gastroesophageal reflux disease)   . Hypertension   . MRSA infection 09/07/2016  . Pleurisy    hx-12/13  . Rectal fissure   . Seasonal allergies   . Skin cancer      Past Surgical History:  Procedure Laterality Date  . APPENDECTOMY  2006   lap append  . CHOLECYSTECTOMY    . [place]  2005  . I&D EXTREMITY Left 07/30/2016   Procedure: left knee arthroscopic wash out;  Surgeon: Melrose Nakayama, MD;  Location: [place];  Service: Orthopedics;  Laterality: Left;  . I&D EXTREMITY Left 08/06/2016   Procedure: IRRIGATION AND DEBRIDEMENT LEFT KNEE W/ SYNOVECTOMY;  Surgeon: Melrose Nakayama, MD;  Location: [place];  Service: Orthopedics;  Laterality: Left;  . KNEE ARTHROSCOPY  2009   left  . KNEE ARTHROSCOPY Left 06/12/2013   Procedure: LEFT KNEE ARTHROSCOPY PARTIAL MEDIAL MENISECTOMY WITH CHONDROPLASTY;  Surgeon: Hessie Dibble, MD;  Location: [place];  Service: Orthopedics;  Laterality: Left;  . KNEE SURGERY  2005   lt  acl  . NASAL FRACTURE SURGERY  2012   closed reduction   . SHOULDER SURGERY  2010   left     Family History  Problem Relation Age of Onset  . Heart attack Mother   . Skin cancer Mother   . Hypertension Mother   . Hyperlipidemia Mother   . Stroke Mother   . Heart attack Father   . Bipolar disorder Maternal Grandmother   . Arthritis Maternal Grandmother   . Hyperlipidemia Maternal Grandmother   . Hypertension Maternal Grandmother   . Arthritis Maternal Grandfather   . Hyperlipidemia Maternal Grandfather   . Stroke Maternal Grandfather   . Hypertension Maternal Grandfather   . Arthritis Paternal Grandmother   . Hypertension Paternal Grandmother   . Arthritis Paternal Grandfather   . Hypertension Paternal Grandfather   .  Hyperlipidemia Brother      Social History   Social History  . Marital status: Married    Spouse name: N/A  . Number of children: 5  . Years of education: 78   Occupational History  . landscaping [employer]   Social History Main Topics  . Smoking status: Former Smoker    Packs/day: 0.25    Years: 4.00    Types: Cigarettes, Cigars    Quit date: 12/27/1984  . Smokeless tobacco: Never Used     Comment: occasional  . Alcohol use Yes     Comment: occ  . Drug use: Yes    Frequency: 2.0 times per week    Types: Marijuana  . Sexual activity: Not on file     Comment: occ cigars-quit cigs   Other Topics Concern  . Not on file   Social History Narrative   Former Company secretary       Review of Systems  Constitutional: Denies fever, chills, fatigue, or significant weight gain/loss. HENT: Head: Denies headache or neck pain Ears: Denies changes in hearing, ringing in ears, earache, drainage Nose: Denies discharge, stuffiness, itching, nosebleed, sinus pain Throat: Denies sore throat, hoarseness, dry mouth, sores, thrush Eyes: Denies loss/changes in vision, pain, redness, blurry/double vision, flashing lights Cardiovascular: Denies chest pain/discomfort, tightness, palpitations, shortness of breath with activity, difficulty lying down, swelling, sudden awakening with shortness of breath Respiratory: Denies shortness of breath, cough, sputum production, wheezing Gastrointestinal: Denies dysphasia, heartburn, change in appetite, nausea, change in bowel habits, rectal bleeding, constipation, diarrhea, yellow skin or eyes Genitourinary: Denies frequency, urgency, burning/pain, blood in urine, incontinence, change in urinary strength. Musculoskeletal: Denies muscle/joint pain, stiffness, back pain, redness or swelling of joints, trauma Skin: Denies rashes, lumps, itching, dryness, color changes, or hair/nail changes Neurological: Denies dizziness, fainting, seizures, weakness, numbness,  tingling, tremor Psychiatric - Denies nervousness, stress, depression or memory loss Endocrine: Denies heat or cold intolerance, sweating, frequent urination, excessive thirst, changes in appetite Hematologic: Denies ease of bruising or bleeding     Objective:     BP 128/84 (BP Location: Left Arm, Patient Position: Sitting, Cuff Size: Large)   Pulse 90   Temp 98.1 F (36.7 C) (Oral)   Resp 16   Ht 5\' 4"  (1.626 m)   Wt 184 lb (83.5 kg)   SpO2 97%   BMI 31.58 kg/m  Nursing note and vital signs reviewed.  Physical Exam  Constitutional: He is oriented to person, place, and time. He appears well-developed and well-nourished. No distress.  HENT:  Head: Normocephalic.  Right Ear: Hearing, tympanic membrane, external ear and ear canal normal.  Left Ear: Hearing, tympanic membrane, external ear  and ear canal normal.  Nose: Nose normal.  Mouth/Throat: Uvula is midline, oropharynx is clear and moist and mucous membranes are normal.  Eyes: Pupils are equal, round, and reactive to light. Conjunctivae and EOM are normal.  Neck: Neck supple. No JVD present. No tracheal deviation present. No thyromegaly present.  Cardiovascular: Normal rate, regular rhythm, normal heart sounds and intact distal pulses.   Pulmonary/Chest: Effort normal and breath sounds normal.  Abdominal: Soft. Bowel sounds are normal. He exhibits no distension and no mass. There is no tenderness. There is no rebound and no guarding.  Musculoskeletal: Normal range of motion. He exhibits no edema or tenderness.  Lymphadenopathy:    He has no cervical adenopathy.  Neurological: He is alert and oriented to person, place, and time. He has normal reflexes. No cranial nerve deficit. He exhibits normal muscle tone. Coordination normal.  Skin: Skin is warm and dry.  Psychiatric: He has a normal mood and affect. His behavior is normal. Judgment and thought content normal.       Assessment & Plan:   Problem List Items Addressed  This Visit      Other   Encounter for general adult medical examination with abnormal findings - Primary    1) Anticipatory Guidance: Discussed importance of wearing a seatbelt while driving and not texting while driving; changing batteries in smoke detector at least once annually; wearing suntan lotion when outside; eating a balanced and moderate diet; getting physical activity at least 30 minutes per day.  2) Immunizations / Screenings / Labs:  All immunizations are up to date per recommendations. Due for a vision screen encouraged to be completed independently. Obtain PSA for prostate cancer screening. All other screenings are up to date per recommendations. Obtain CBC, CMET, and lipid profile.     Overall well exam with risk factors for cardiovascular disease including obesity and hypertension. Chronic conditions appear adequately controlled with exacerbation of GERD. Start pantoprazole. Recommend weight loss of 5-10% of current body weight through nutrition and physical activity. Continue other healthy lifestyle behaviors and choices. Follow up prevention exam in 1 year. Follow up office visit pending blood work and for chronic conditions.      Relevant Orders   PSA   CBC   Comprehensive metabolic panel   Lipid panel       I have discontinued Mr. Wrenn's azithromycin. I am also having him start on hydrocortisone and pantoprazole. Additionally, I am having him maintain his calcium carbonate, fluticasone, ranitidine, mupirocin cream, tetrahydrozoline-zinc, amLODipine, and lisinopril.   Meds ordered this encounter  Medications  . hydrocortisone (ANUSOL-HC) 2.5 % rectal cream    Sig: Place 1 application rectally 2 (two) times daily.    Dispense:  30 g    Refill:  0    Order Specific Question:   Supervising Provider    Answer:   Pricilla Holm A [[id number]]  . pantoprazole (PROTONIX) 40 MG tablet    Sig: Take 1 tablet (40 mg total) by mouth daily.    Dispense:  30 tablet     Refill:  3    Order Specific Question:   Supervising Provider    Answer:   Pricilla Holm A [[id number]]     Follow-up: Return if symptoms worsen or fail to improve.   Mauricio Po, FNP

## 2017-07-07 NOTE — Assessment & Plan Note (Signed)
1) Anticipatory Guidance: Discussed importance of wearing a seatbelt while driving and not texting while driving; changing batteries in smoke detector at least once annually; wearing suntan lotion when outside; eating a balanced and moderate diet; getting physical activity at least 30 minutes per day.  2) Immunizations / Screenings / Labs:  All immunizations are up to date per recommendations. Due for a vision screen encouraged to be completed independently. Obtain PSA for prostate cancer screening. All other screenings are up to date per recommendations. Obtain CBC, CMET, and lipid profile.     Overall well exam with risk factors for cardiovascular disease including obesity and hypertension. Chronic conditions appear adequately controlled with exacerbation of GERD. Start pantoprazole. Recommend weight loss of 5-10% of current body weight through nutrition and physical activity. Continue other healthy lifestyle behaviors and choices. Follow up prevention exam in 1 year. Follow up office visit pending blood work and for chronic conditions.

## 2017-08-04 IMAGING — CR DG KNEE COMPLETE 4+V*L*
4 series · 4 of 4 positions shown · non-contrast
Comparison: None.

CLINICAL DATA: Left knee surgery 2 weeks prior. Fluid build up
requiring drainage twice in the last 2 weeks, last drainage
yesterday. Increased swelling since that time.

EXAM:
LEFT KNEE - COMPLETE 4+ VIEW

[knee ap]
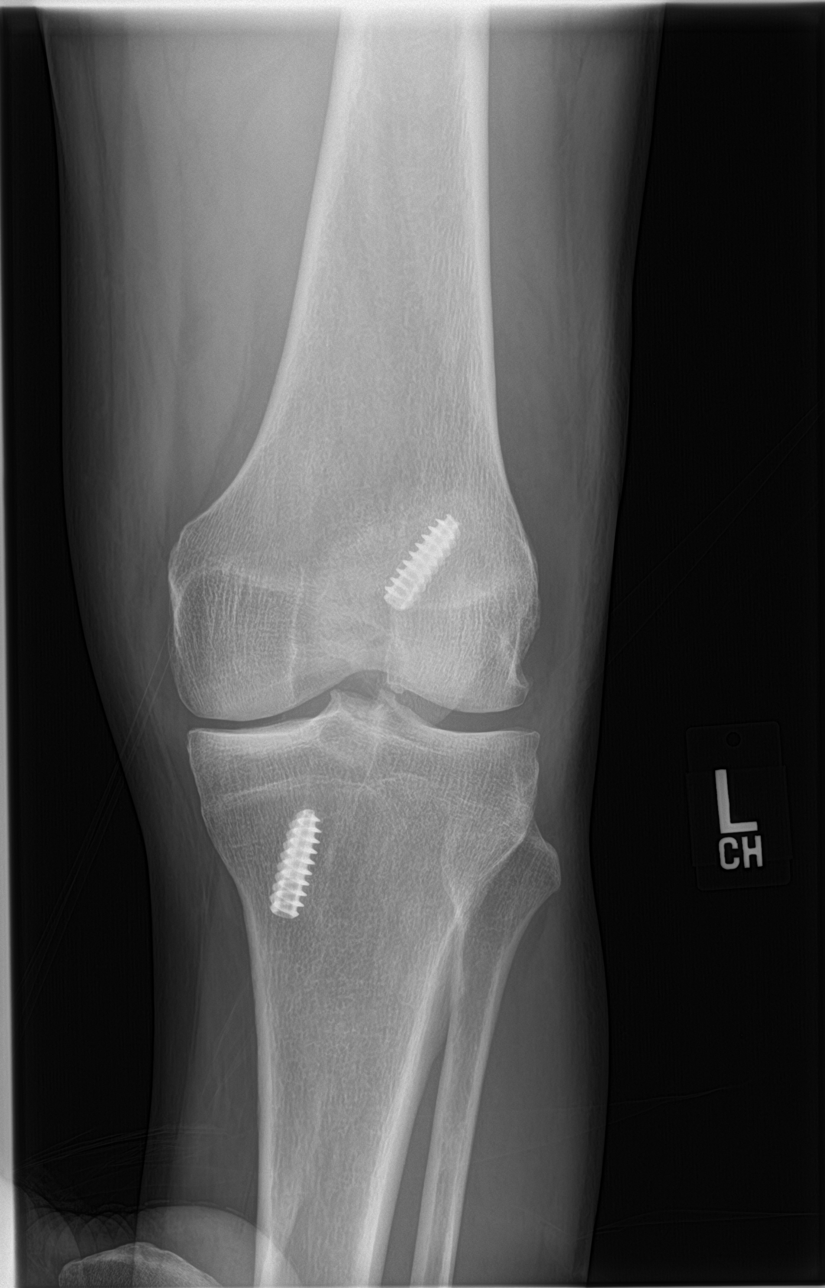

[knee lat]
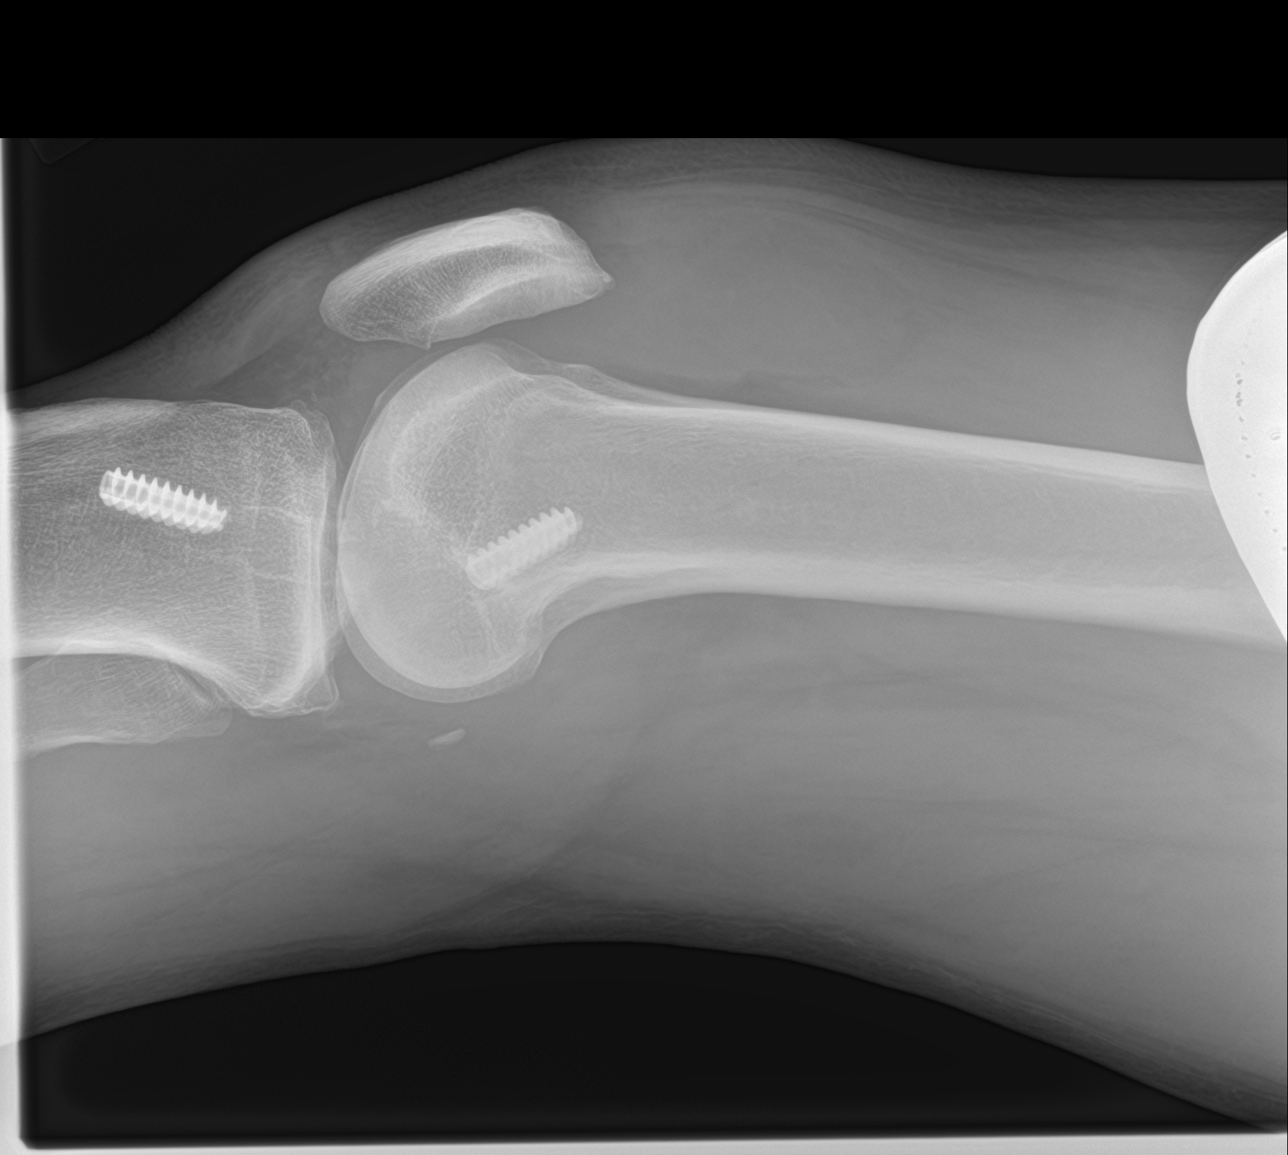

[knee obl (1 of 2)]
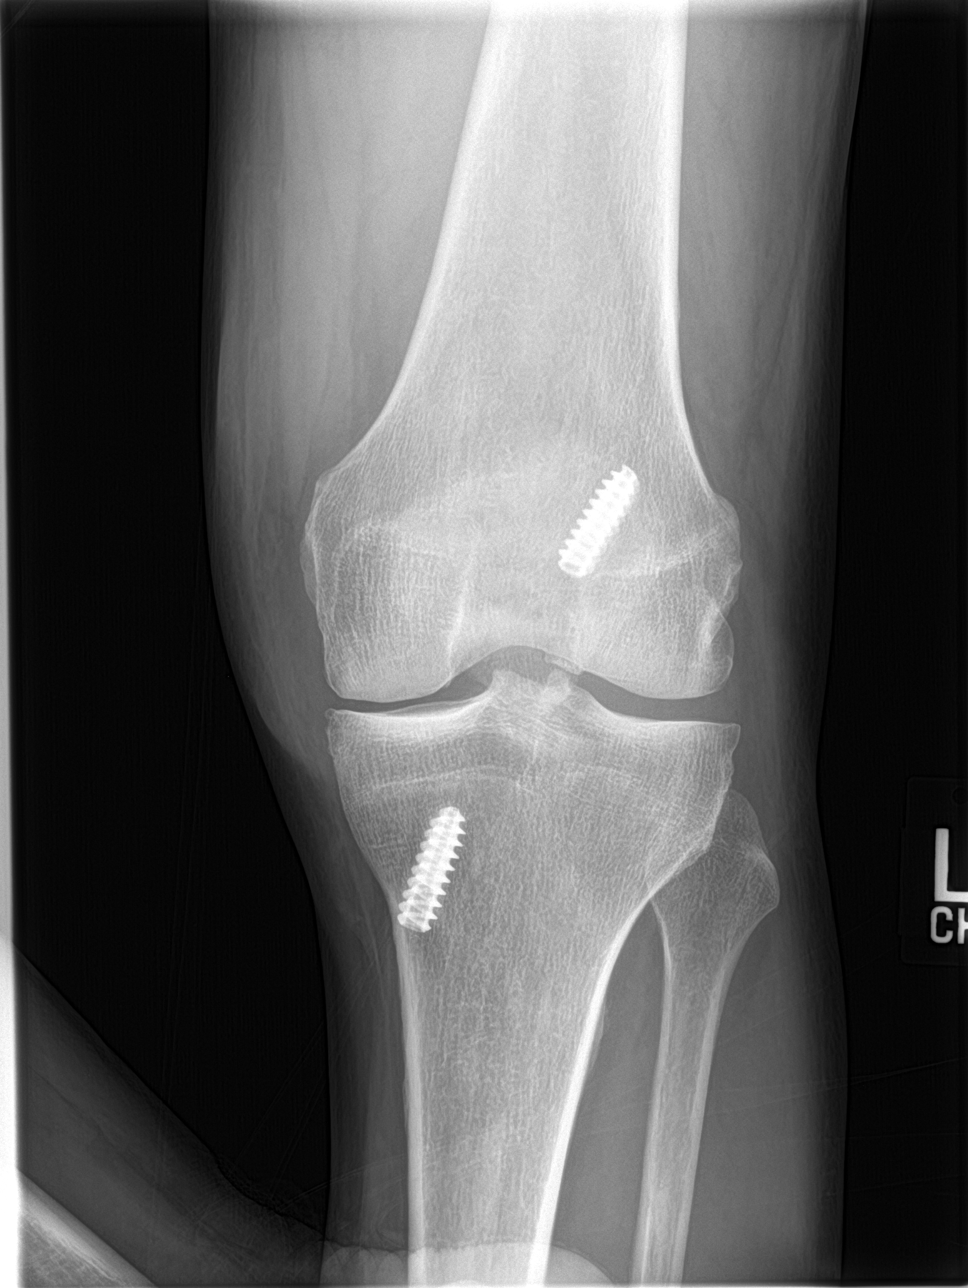

[knee obl (2 of 2)]
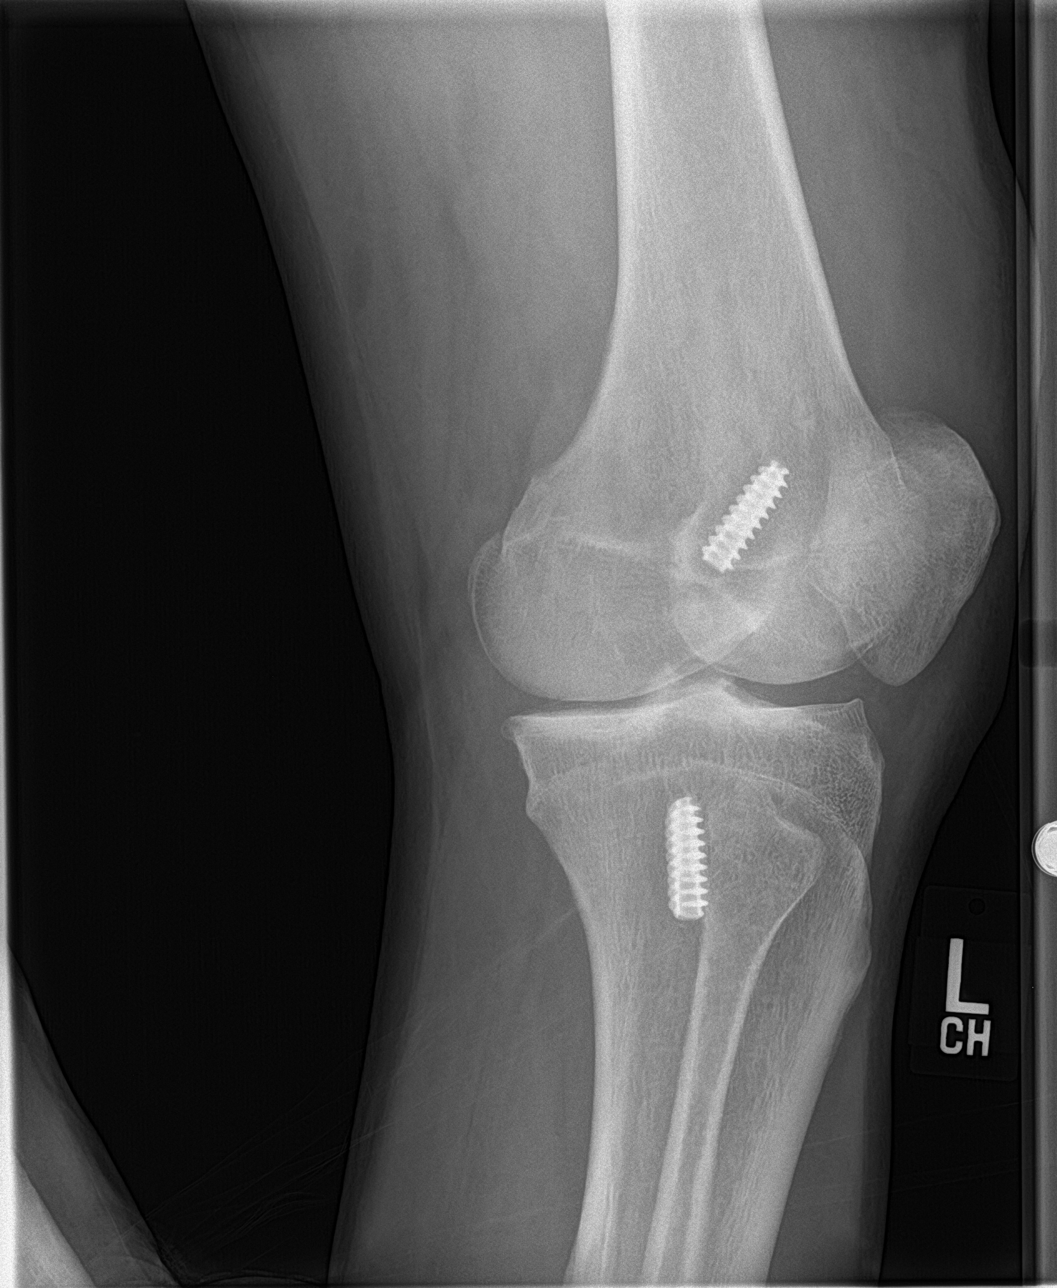

[4 of 4 positions shown; findings below may reference images not displayed]

FINDINGS: Moderate to large suprapatellar joint effusion. No definite internal
air. No acute osseous abnormalities. Prior ACL repair, anchors are
in appropriate position. Medial tibial femoral joint space narrowing
and peripheral spurring. Tiny patellofemoral osteophytes. No bony
destructive change.
IMPRESSION: Moderate to large suprapatellar joint effusion. No acute osseous
abnormality or bony destructive change.

## 2017-08-30 ENCOUNTER — Other Ambulatory Visit: Payer: Self-pay | Admitting: Family

## 2017-08-30 DIAGNOSIS — I10 Essential (primary) hypertension: Secondary | ICD-10-CM

## 2017-09-06 ENCOUNTER — Telehealth: Payer: Self-pay | Admitting: Emergency Medicine

## 2017-09-06 NOTE — Telephone Encounter (Signed)
Spoke with pt. He is wanting to reschedule his appointment. Pt is having a hard time breathing while speaking. WHILE ON THE PHONE WITH THE PT, HE WAS NOT IN DISTRESS. He has been scheduled to see SG on 09/07/17 at 3:30pm. Nothing further was needed.

## 2017-09-07 ENCOUNTER — Ambulatory Visit (INDEPENDENT_AMBULATORY_CARE_PROVIDER_SITE_OTHER): Payer: BLUE CROSS/BLUE SHIELD | Admitting: Internal Medicine

## 2017-09-07 ENCOUNTER — Encounter: Payer: Self-pay | Admitting: Internal Medicine

## 2017-09-07 ENCOUNTER — Ambulatory Visit (INDEPENDENT_AMBULATORY_CARE_PROVIDER_SITE_OTHER)
Admission: RE | Admit: 2017-09-07 | Discharge: 2017-09-07 | Disposition: A | Discharge status: Home / Self Care | Payer: BLUE CROSS/BLUE SHIELD | Source: Ambulatory Visit | Attending: Acute Care | Admitting: Acute Care

## 2017-09-07 ENCOUNTER — Encounter: Payer: Self-pay | Admitting: Acute Care

## 2017-09-07 ENCOUNTER — Ambulatory Visit (INDEPENDENT_AMBULATORY_CARE_PROVIDER_SITE_OTHER): Payer: BLUE CROSS/BLUE SHIELD | Admitting: Acute Care

## 2017-09-07 ENCOUNTER — Other Ambulatory Visit (INDEPENDENT_AMBULATORY_CARE_PROVIDER_SITE_OTHER): Payer: BLUE CROSS/BLUE SHIELD

## 2017-09-07 VITALS — BP 144/90 | HR 76 | Temp 98.4°F | Ht 69.0 in

## 2017-09-07 VITALS — BP 128/78 | HR 82 | Ht 69.0 in | Wt 197.1 lb

## 2017-09-07 DIAGNOSIS — R0609 Other forms of dyspnea: Secondary | ICD-10-CM

## 2017-09-07 DIAGNOSIS — F17200 Nicotine dependence, unspecified, uncomplicated: Secondary | ICD-10-CM | POA: Diagnosis not present

## 2017-09-07 DIAGNOSIS — R079 Chest pain, unspecified: Secondary | ICD-10-CM | POA: Diagnosis not present

## 2017-09-07 DIAGNOSIS — F32A Depression, unspecified: Secondary | ICD-10-CM | POA: Insufficient documentation

## 2017-09-07 DIAGNOSIS — R0600 Dyspnea, unspecified: Secondary | ICD-10-CM | POA: Insufficient documentation

## 2017-09-07 DIAGNOSIS — F329 Major depressive disorder, single episode, unspecified: Secondary | ICD-10-CM

## 2017-09-07 DIAGNOSIS — R0602 Shortness of breath: Secondary | ICD-10-CM

## 2017-09-07 DIAGNOSIS — Z23 Encounter for immunization: Secondary | ICD-10-CM | POA: Diagnosis not present

## 2017-09-07 DIAGNOSIS — I10 Essential (primary) hypertension: Secondary | ICD-10-CM | POA: Diagnosis not present

## 2017-09-07 LAB — BRAIN NATRIURETIC PEPTIDE: Pro B Natriuretic peptide (BNP): 32 pg/mL (ref 0.0–100.0)

## 2017-09-07 LAB — CBC
HCT: 41.8 % (ref 39.0–52.0)
Hemoglobin: 14 g/dL (ref 13.0–17.0)
MCHC: 33.6 g/dL (ref 30.0–36.0)
MCV: 92.2 fl (ref 78.0–100.0)
PLATELETS: 288 10*3/uL (ref 150.0–400.0)
RBC: 4.53 Mil/uL (ref 4.22–5.81)
RDW: 13.4 % (ref 11.5–15.5)
WBC: 7.1 10*3/uL (ref 4.0–10.5)

## 2017-09-07 NOTE — Patient Instructions (Addendum)
It is nice to meet you today. We will do a CXR today We will draw some labs.( BNP, CBC) Referral to cardiology for HTN, family history. We will schedule you for PFT's. Follow up with Terri Piedra, NP about depression.   Follow up with Dr. Lamonte Sakai or Judson Roch after PFT's. Please contact office for sooner follow up if symptoms do not improve or worsen or seek emergency care

## 2017-09-07 NOTE — Assessment & Plan Note (Signed)
Patient has noticed worsening shortness of breath with exertion over the last 6 months Former smoker Significant family cardiac history Plan We will do a CXR today We will draw some labs.( BNP, CBC) Referral to cardiology for HTN, family history. We will schedule you for PFT's. Follow up with Dr. Lamonte Sakai or Judson Roch after PFT's. Please contact office for sooner follow up if symptoms do not improve or worsen or seek emergency care  Consider echo if BNP is elevated

## 2017-09-07 NOTE — Assessment & Plan Note (Signed)
Patient is tearful in the office today and states that he has been dealing with a lot of stress Patient states he is not a danger to himself or others Patient states he does not have any thoughts of harming himself Plan Follow up with Terri Piedra, NP about depression.   Discussion regarding multiple new antianxiety medications Encourage patient not to drink to deal with his stress and depression Follow up with Dr. Lamonte Sakai or Judson Roch after PFT's. Please contact office for sooner follow up if symptoms do not improve or worsen or seek emergency care

## 2017-09-07 NOTE — Patient Instructions (Addendum)
Your EKG was normal today  Your chest xray results are still pending  Please start aspirin 81 mg per day  Please continue all other medications as before, and refills have been done if requested.  Please have the pharmacy call with any other refills you may need.  Please continue your efforts at being more active, low cholesterol diet, and weight control.  Please keep your appointments with your specialists as you may have planned  You will be contacted regarding the referral for: Stress test

## 2017-09-07 NOTE — Progress Notes (Signed)
Subjective:    Patient ID: [name], male    DOB: 1961-04-14, 56 y.o.   MRN: [id number]  HPI  Here after being seen per pulmonary this am, with c/o chest pain c/w a type of upper mid chest tightness with some radiation to the left arm and neck, mild, intermittent, ongoing for at least 1-2 months, is nonexertional, nonpositional and nonpleuritic though does have hx of left chest scarring and pleuritic pain.  Pain is possible assoc with sob, but not n/v, diaphoresis, palp or dizziness.  Still smoking but now down to < 1/2 ppd.  Denies worsening reflux, abd pain, dysphagia, n/v, bowel change or blood. Past Medical History:  Diagnosis Date  . Allergy   . Asthma    bronchitis  . Colon polyps   . GERD (gastroesophageal reflux disease)   . Hypertension   . MRSA infection 09/07/2016  . Pleurisy    hx-12/13  . Rectal fissure   . Seasonal allergies   . Skin cancer    Past Surgical History:  Procedure Laterality Date  . APPENDECTOMY  2006   lap append  . CHOLECYSTECTOMY    . [place]  2005  . I&D EXTREMITY Left 07/30/2016   Procedure: left knee arthroscopic wash out;  Surgeon: Melrose Nakayama, MD;  Location: [place];  Service: Orthopedics;  Laterality: Left;  . I&D EXTREMITY Left 08/06/2016   Procedure: IRRIGATION AND DEBRIDEMENT LEFT KNEE W/ SYNOVECTOMY;  Surgeon: Melrose Nakayama, MD;  Location: [place];  Service: Orthopedics;  Laterality: Left;  . KNEE ARTHROSCOPY  2009   left  . KNEE ARTHROSCOPY Left 06/12/2013   Procedure: LEFT KNEE ARTHROSCOPY PARTIAL MEDIAL MENISECTOMY WITH CHONDROPLASTY;  Surgeon: Hessie Dibble, MD;  Location: [place];  Service: Orthopedics;  Laterality: Left;  . KNEE SURGERY  2005   lt acl  . NASAL FRACTURE SURGERY  2012   closed reduction   . SHOULDER SURGERY  2010   left    reports that he quit smoking about 32 years ago. His smoking use included Cigarettes and Cigars. He has a 1.00 pack-year smoking history. He has never used  smokeless tobacco. He reports that he drinks alcohol. He reports that he uses drugs, including Marijuana, about 2 times per week. family history includes Arthritis in his maternal grandfather, maternal grandmother, paternal grandfather, and paternal grandmother; Bipolar disorder in his maternal grandmother; Heart attack in his father and mother; Hyperlipidemia in his brother, maternal grandfather, maternal grandmother, and mother; Hypertension in his maternal grandfather, maternal grandmother, mother, paternal grandfather, and paternal grandmother; Skin cancer in his mother; Stroke in his maternal grandfather and mother. Allergies  Allergen Reactions  . Codeine Itching, Nausea Only and Rash   Current Outpatient Prescriptions on File Prior to Visit  Medication Sig Dispense Refill  . amLODipine (NORVASC) 10 MG tablet TAKE ONE TABLET BY MOUTH DAILY 90 tablet 0  . amphetamine-dextroamphetamine (ADDERALL) 30 MG tablet Take 30 mg by mouth daily.    . calcium carbonate (TUMS - DOSED IN MG ELEMENTAL CALCIUM) 500 MG chewable tablet Chew 1-2 tablets by mouth as needed for heartburn.    . cetirizine (ZYRTEC) 10 MG tablet Take 10 mg by mouth daily.    . fluticasone (FLONASE) 50 MCG/ACT nasal spray Place 2 sprays into both nostrils daily as needed for allergies.     [name] lisinopril (PRINIVIL,ZESTRIL) 20 MG tablet TAKE ONE TABLET BY MOUTH DAILY 90 tablet 0  . meloxicam (MOBIC) 15 MG tablet Take 15 mg  by mouth daily.    . mupirocin cream (BACTROBAN) 2 % 1 application See admin instructions. Into both nostrils two times daily    . pantoprazole (PROTONIX) 40 MG tablet Take 1 tablet (40 mg total) by mouth daily. 30 tablet 3   No current facility-administered medications on file prior to visit.    Review of Systems  Constitutional: Negative for other unusual diaphoresis or sweats HENT: Negative for ear discharge or swelling Eyes: Negative for other worsening visual disturbances Respiratory: Negative for stridor or  other swelling  Gastrointestinal: Negative for worsening distension or other blood Genitourinary: Negative for retention or other urinary change Musculoskeletal: Negative for other MSK pain or swelling Skin: Negative for color change or other new lesions Neurological: Negative for worsening tremors and other numbness  Psychiatric/Behavioral: Negative for worsening agitation or other fatigue All other system eng per pt    Objective:   Physical Exam BP (!) 144/90   Pulse 76   Temp 98.4 F (36.9 C) (Oral)   Ht 5\' 9"  (1.753 m)   SpO2 100%   BMI 29.11 kg/m  VS noted, not ill appaering Constitutional: Pt appears in NAD HENT: Head: NCAT.  Right Ear: External ear normal.  Left Ear: External ear normal.  Eyes: . Pupils are equal, round, and reactive to light. Conjunctivae and EOM are normal Nose: without d/c or deformity Neck: Neck supple. Gross normal ROM Cardiovascular: Normal rate and regular rhythm.   Pulmonary/Chest: Effort normal and breath sounds without rales or wheezing.  Abd:  Soft, NT, + BS Neurological: Pt is alert. At baseline orientation, motor grossly intact Skin: Skin is warm. No rashes, other new lesions, no LE edema Psychiatric: Pt behavior is normal without agitation  No other exam findings  ECG today I have personally interpreted NSR - no acute ST or T wave changes    Assessment & Plan:

## 2017-09-07 NOTE — Progress Notes (Signed)
History of Present Illness [name] is a 56 y.o. male former smoker with dyspnea, mild obstruction ,allergic rhinitis.He is followed by Dr. Lamonte Sakai.   09/07/2017 Acute OV for Dyspnea : Pt. Presents today for follow up. He was last seen in the office 04/2015. He missed his follow up due to being in the hospital with MRSA infection in his left knee 7/ 2017. Pt. States he has moved to a new home a few months ago. He states the house is dusty. He has noticed worsening dyspnea with exertion over the last 6 months. He states he cannot finish sentences without taking a beep breath. 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 He denies any fever, he complains of left sided chest pain with cough. He said he has had this for a significant period of time due to some scarring. He does not take any NSAID's for this. He states he has a cough. He states he coughs up black secretions. 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 He denies any bright red blood. He states he has seen all blood and he does not think this is all blood. He thinks this is from inhaling the soil he works in. He states he does have occasional chest tightness. He states he does have swelling in his feet.He has noticed the swelling in his feet x 6 months.He denies fever, orthopnea or hemoptysis.He is tearful in the office today, states he has had a lot of stress lately. He is embarrassed to talk to his primary care doctor about his depression. He denies chest pain, fever, orthopnea, or hemoptysis.  Test Results:  CBC Latest Ref Rng & Units 09/07/2017 07/07/2017 11/08/2016  WBC 4.0 - 10.5 K/uL 7.1 11.8(H) 5.6  Hemoglobin 13.0 - 17.0 g/dL 14.0 14.6 14.4  Hematocrit 39.0 - 52.0 % 41.8 43.1 43.7  Platelets 150.0 - 400.0 K/uL 288.0 328.0 271    BMP Latest Ref Rng & Units 07/07/2017 11/08/2016 08/26/2016  Glucose 70 - 99 mg/dL 124(H) 109(H) 95  BUN 6 - 23 mg/dL 18 13 16   Creatinine 0.40 -  1.50 mg/dL 1.03 1.02 0.97  Sodium 135 - 145 mEq/L 137 140 134(L)  Potassium 3.5 - 5.1 mEq/L 4.6 5.3 4.5  Chloride 96 - 112 mEq/L 101 105 99(L)  CO2 19 - 32 mEq/L 29 29 24   Calcium 8.4 - 10.5 mg/dL 10.4 9.6 10.0     Past medical hx Past Medical History:  Diagnosis Date  . Allergy   . Asthma    bronchitis  . Colon polyps   . GERD (gastroesophageal reflux disease)   . Hypertension   . MRSA infection 09/07/2016  . Pleurisy    hx-12/13  . Rectal fissure   . Seasonal allergies   . Skin cancer      Social History  Substance Use Topics  . Smoking status: Former Smoker    Packs/day: 0.25    Years: 4.00    Types: Cigarettes, Cigars    Quit date: 12/27/1984  . Smokeless tobacco: Never Used     Comment: occasional  . Alcohol use Yes     Comment: occ    Mr.Guymon reports that he quit smoking about 32 years ago. His smoking use included Cigarettes and Cigars. He has a 1.00 pack-year smoking history. He has never used smokeless tobacco. He reports that he drinks alcohol. He reports that he uses drugs, including Marijuana, about 2  times per week.  Tobacco Cessation: Former smoker quit in 1986  Past surgical hx, Family hx, Social hx all reviewed.  Current Outpatient Prescriptions on File Prior to Visit  Medication Sig  . amLODipine (NORVASC) 10 MG tablet TAKE ONE TABLET BY MOUTH DAILY  . calcium carbonate (TUMS - DOSED IN MG ELEMENTAL CALCIUM) 500 MG chewable tablet Chew 1-2 tablets by mouth as needed for heartburn.  . fluticasone (FLONASE) 50 MCG/ACT nasal spray Place 2 sprays into both nostrils daily as needed for allergies.   [name] lisinopril (PRINIVIL,ZESTRIL) 20 MG tablet TAKE ONE TABLET BY MOUTH DAILY  . mupirocin cream (BACTROBAN) 2 % 1 application See admin instructions. Into both nostrils two times daily  . pantoprazole (PROTONIX) 40 MG tablet Take 1 tablet (40 mg total) by mouth daily.   No current facility-administered medications on file prior to visit.      Allergies    Allergen Reactions  . Codeine Itching, Nausea Only and Rash    Review Of Systems:  Constitutional:   No  weight loss, night sweats,  Fevers, chills, fatigue, or  lassitude.  HEENT:   No headaches,  Difficulty swallowing,  Tooth/dental problems, or  Sore throat,                No sneezing, itching, ear ache, nasal congestion, post nasal drip,   CV:  No chest pain,  Orthopnea, PND, swelling in lower extremities, anasarca, dizziness, palpitations, syncope.   GI  No heartburn, indigestion, abdominal pain, nausea, vomiting, diarrhea, change in bowel habits, loss of appetite, bloody stools.   Resp: + shortness of breath with exertion less at rest.  No excess mucus, rare productive cough,  No non-productive cough,  No coughing up of blood.  + change in color of mucus.  No wheezing.  No chest wall deformity  Skin: no rash or lesions.  GU: no dysuria, change in color of urine, no urgency or frequency.  No flank pain, no hematuria   MS:  No joint pain or swelling.  No decreased range of motion.  No back pain.  Psych:  No change in mood or affect. + depression +anxiety.  No memory loss.   Vital Signs BP 128/78 (BP Location: Left Arm, Cuff Size: Normal)   Pulse 82   Ht 5\' 9"  (1.753 m)   Wt 197 lb 2 oz (89.4 kg)   SpO2 96%   BMI 29.11 kg/m    Physical Exam:  General- No distress,  A&Ox3, pleasant but clearly anxious ENT: No sinus tenderness, TM clear, pale nasal mucosa, no oral exudate,no post nasal drip, no LAN Cardiac: S1, S2, regular rate and rhythm, no murmur Chest: No wheeze/ rales/ dullness; no accessory muscle use, no nasal flaring, no sternal retractions Abd.: Soft Non-tender, nondistended Ext: No clubbing cyanosis, trace bilateral lower extremity edema Neuro:  normal strength Skin: No rashes, warm and dry Psych: Anxious and tearful in the office today   Assessment/Plan  Dyspnea Patient has noticed worsening shortness of breath with exertion over the last 6  months Former smoker Significant family cardiac history Plan We will do a CXR today We will draw some labs.( BNP, CBC) Referral to cardiology for HTN, family history. We will schedule you for PFT's. Follow up with Dr. Lamonte Sakai or Judson Roch after PFT's. Please contact office for sooner follow up if symptoms do not improve or worsen or seek emergency care  Consider echo if BNP is elevated   Depression Patient is tearful in the office today  and states that he has been dealing with a lot of stress Patient states he is not a danger to himself or others Patient states he does not have any thoughts of harming himself Plan Follow up with Terri Piedra, NP about depression.   Discussion regarding multiple new antianxiety medications Encourage patient not to drink to deal with his stress and depression Follow up with Dr. Lamonte Sakai or Judson Roch after PFT's. Please contact office for sooner follow up if symptoms do not improve or worsen or seek emergency care      Magdalen Spatz, NP 09/07/2017  5:27 PM

## 2017-09-08 ENCOUNTER — Ambulatory Visit: Payer: BLUE CROSS/BLUE SHIELD | Admitting: Family

## 2017-09-10 MED ORDER — ASPIRIN 81 MG PO TBEC
81.0000 mg | DELAYED_RELEASE_TABLET | Freq: Every day | ORAL | 12 refills | Status: DC
Start: 2017-09-10 — End: 2020-01-24

## 2017-09-10 NOTE — Assessment & Plan Note (Signed)
Counseled to quit, declines chantix 

## 2017-09-10 NOTE — Assessment & Plan Note (Addendum)
Atypical, etiology unclear, ecg reviewed, final cxr pending from earlier today, will cont same tx except add ASA 81 mg - 1 per day, will order stress testing to further evaluate

## 2017-09-10 NOTE — Assessment & Plan Note (Signed)
Mild elevated today but < 140/90 at home per pt, and stable overall by history and exam, recent data reviewed with pt, and pt to continue medical treatment as before,  to f/u any worsening symptoms or concerns BP Readings from Last 3 Encounters:  09/07/17 (!) 144/90  09/07/17 128/78  07/07/17 128/84

## 2017-09-15 ENCOUNTER — Telehealth (HOSPITAL_COMMUNITY): Payer: Self-pay | Admitting: Internal Medicine

## 2017-09-16 ENCOUNTER — Telehealth: Payer: Self-pay | Admitting: Internal Medicine

## 2017-09-16 DIAGNOSIS — R079 Chest pain, unspecified: Secondary | ICD-10-CM

## 2017-09-16 NOTE — Telephone Encounter (Signed)
error 

## 2017-09-22 NOTE — Telephone Encounter (Signed)
User: Cherie Dark A Date/time: 09/22/17 2:09 PM  Comment: Called pt and lmsg for him to CB to get scheduled for myoview.   Context:  Outcome: Left Message  Phone number: [phone] Phone Type: Home Phone  Comm. type: Telephone Call type: Outgoing  Contact: Friedmann, Ulas K Relation to patient: Self    User: Cherie Dark A Date/time: 09/19/17 3:11 PM  Comment: called pt and lmsg for him to CB to get scheduled for myoview.  Context:  Outcome: Left Message  Phone number: [phone] Phone Type: Home Phone  Comm. type: Telephone Call type: Outgoing  Contact: Vegh, Fallou K Relation to patient: Self    User: Cherie Dark A Date/time: 09/15/17 1:39 PM  Comment: Called pt and lmsg for him to CB to get scheduled for myoview  Context:  Outcome: Left Message  Phone number: [phone] Phone Type: Home Phone  Comm. type: Telephone Call type: Outgoing  Contact: Suttles, Niels K Relation to patient: Self

## 2017-09-26 ENCOUNTER — Ambulatory Visit: Payer: BLUE CROSS/BLUE SHIELD | Admitting: Acute Care

## 2017-09-26 ENCOUNTER — Encounter: Payer: Self-pay | Admitting: Internal Medicine

## 2017-10-06 ENCOUNTER — Telehealth: Payer: Self-pay | Admitting: Emergency Medicine

## 2017-10-06 NOTE — Telephone Encounter (Signed)
Notes recorded by Magdalen Spatz, NP on 09/12/2017 at 5:08 PM EDT Please call patient and let him know the lab work we treated the other day was normal, and that his chest x-ray was normal. Have him follow through with the pulmonary function tests and a cardiology referral as was planned in the office on the day of the visit. Thank you ---------- Spoke with pt, aware of results/recs.  Nothing further needed.

## 2017-10-07 DIAGNOSIS — M25562 Pain in left knee: Secondary | ICD-10-CM | POA: Diagnosis not present

## 2017-10-27 ENCOUNTER — Ambulatory Visit (INDEPENDENT_AMBULATORY_CARE_PROVIDER_SITE_OTHER): Payer: Self-pay | Admitting: Family Medicine

## 2017-10-27 ENCOUNTER — Encounter: Payer: Self-pay | Admitting: Family Medicine

## 2017-10-27 VITALS — BP 142/86 | HR 71 | Temp 98.5°F | Ht 69.0 in | Wt 194.0 lb

## 2017-10-27 DIAGNOSIS — M5442 Lumbago with sciatica, left side: Secondary | ICD-10-CM

## 2017-10-27 MED ORDER — MELOXICAM 15 MG PO TABS: 15.0000 mg | ORAL_TABLET | Freq: Every day | ORAL | 0 refills | Status: DC

## 2017-10-27 MED ORDER — CYCLOBENZAPRINE HCL 10 MG PO TABS: 10.0000 mg | ORAL_TABLET | Freq: Three times a day (TID) | ORAL | 1 refills | Status: DC | PRN

## 2017-10-27 MED ORDER — PREDNISONE 5 MG PO TABS: ORAL_TABLET | ORAL | 0 refills | Status: DC

## 2017-10-27 NOTE — Assessment & Plan Note (Signed)
Back pain seems a component of spasm as well as some vertigo symptoms on the left side. No previous imaging to review. He has reported history of injuries to his back but no prior surgery. - Prednisone provided - Meloxicam and Flexeril  - Counseled on home exercise therapy - If no improvement would obtain x-ray and consider formal physical therapy.

## 2017-10-27 NOTE — Progress Notes (Signed)
[name] - 56 y.o. male MRN [id number]  Date of birth: [birth year]  SUBJECTIVE:  Including CC & ROS.  Chief Complaint  Patient presents with  . Back Pain    located lower back-pain extends down to his legs-he was working 10/20/17 and noticed it the next morning. Pain increases after sitting down for awhile and getting up. He states he has had injuries to his back in the past.      [name] is a 56 y.o. male that is is a 56 year old male is presenting with lower back pain. This pain is acute on chronic in nature. The pain started 3 days ago and has been staying the same. The pain is significant in nature. He has not taken any medications for the pain. The pain is worse with any form of movement. He denies any radicular type pain. The pain is sharp and stabbing. He has a history of coronary accidents and injuries from football all before the year 2000. He is not had any significant workup for his back. He has had other complaints with his shoulder and knees. 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 He is also on your house and has been working around the yard.   Review of Systems  Musculoskeletal: Positive for back pain. Negative for gait problem.  Skin: Negative for color change.  Neurological: Negative for weakness and numbness.    HISTORY: Past Medical, Surgical, Social, and Family History Reviewed & Updated per EMR.   Pertinent Historical Findings include:  Past Medical History:  Diagnosis Date  . Allergy   . Asthma    bronchitis  . Colon polyps   . GERD (gastroesophageal reflux disease)   . Hypertension   . MRSA infection 09/07/2016  . Pleurisy    hx-12/13  . Rectal fissure   . Seasonal allergies   . Skin cancer     Past Surgical History:  Procedure Laterality Date  . APPENDECTOMY  2006   lap append  . CHOLECYSTECTOMY    . [place]  2005  . I&D EXTREMITY Left 07/30/2016   Procedure: left knee arthroscopic wash out;  Surgeon: Melrose Nakayama, MD;  Location: [facility];  Service: Orthopedics;  Laterality: Left;  . I&D EXTREMITY Left 08/06/2016   Procedure: IRRIGATION AND DEBRIDEMENT LEFT KNEE W/ SYNOVECTOMY;  Surgeon: Melrose Nakayama, MD;  Location: [facility];  Service: Orthopedics;  Laterality: Left;  . KNEE ARTHROSCOPY  2009   left  . KNEE ARTHROSCOPY Left 06/12/2013   Procedure: LEFT KNEE ARTHROSCOPY PARTIAL MEDIAL MENISECTOMY WITH CHONDROPLASTY;  Surgeon: Hessie Dibble, MD;  Location: [facility];  Service: Orthopedics;  Laterality: Left;  . KNEE SURGERY  2005   lt acl  . NASAL FRACTURE SURGERY  2012   closed reduction   . SHOULDER SURGERY  2010   left    Allergies  Allergen Reactions  . Codeine Itching, Nausea Only and Rash    Family History  Problem Relation Age of Onset  . Heart attack Mother   . Skin cancer Mother   . Hypertension Mother   . Hyperlipidemia Mother   . Stroke Mother   . Heart attack Father   . Bipolar disorder Maternal Grandmother   . Arthritis Maternal Grandmother   . Hyperlipidemia Maternal Grandmother   . Hypertension Maternal Grandmother   . Arthritis Maternal Grandfather   . Hyperlipidemia Maternal Grandfather   . Stroke Maternal Grandfather   . Hypertension Maternal Grandfather   .  Arthritis Paternal Grandmother   . Hypertension Paternal Grandmother   . Arthritis Paternal Grandfather   . Hypertension Paternal Grandfather   . Hyperlipidemia Brother      Social History   Social History  . Marital status: Married    Spouse name: N/A  . Number of children: 5  . Years of education: 76   Occupational History  . landscaping [employer]   Social History Main Topics  . Smoking status: Former Smoker    Packs/day: 0.25    Years: 4.00    Types: Cigarettes, Cigars    Quit date: 12/27/1984  . Smokeless tobacco: Never Used     Comment: occasional  . Alcohol use Yes     Comment: occ  . Drug use: Yes    Frequency: 2.0 times per week    Types: Marijuana  . Sexual  activity: Not on file     Comment: occ cigars-quit cigs   Other Topics Concern  . Not on file   Social History Narrative   Former Company secretary      PHYSICAL EXAM:  VS: BP (!) 142/86 (BP Location: Left Arm, Patient Position: Sitting, Cuff Size: Normal)   Pulse 71   Temp 98.5 F (36.9 C) (Oral)   Ht 5\' 9"  (1.753 m)   Wt 194 lb (88 kg)   SpO2 100%   BMI 28.65 kg/m  Physical Exam Gen: NAD, alert, cooperative with exam, well-appearing ENT: normal lips, normal nasal mucosa,  Eye: normal EOM, normal conjunctiva and lids CV:  no edema, +2 pedal pulses   Resp: no accessory muscle use, non-labored,  Skin: no rashes, no areas of induration  Neuro: normal tone, normal sensation to touch Psych:  normal insight, alert and oriented MSK:  Back: Tender to palpation over the right and left paraspinal muscles. No tenderness to palpation of the lumbar midline spine. Normal internal Rotation of the hips bilaterally. Normal knee flexion and extension strength resistance. +2 deep tender reflexes at the patella. Normal plantar and dorsiflexion. Negative straight leg raise on the right. Positive straight leg raise on the left. Neurovascularly intact.        ASSESSMENT & PLAN:   Acute bilateral low back pain with left-sided sciatica Back pain seems a component of spasm as well as some vertigo symptoms on the left side. No previous imaging to review. He has reported history of injuries to his back but no prior surgery. - Prednisone provided - Meloxicam and Flexeril  - Counseled on home exercise therapy - If no improvement would obtain x-ray and consider formal physical therapy.

## 2017-10-27 NOTE — Patient Instructions (Signed)
Thank you for coming in,   Please take the prednisone and then take the mobic after. The muscle relaxer can make you drowsy so please try it before you have to drive or operate heavy machinery.   Please the exercises once your pain has improved.   Please let me know if you don't have improvement of your symptoms.     Please feel free to call with any questions or concerns at any time, at [phone]. --Dr. Raeford Razor

## 2017-11-04 DIAGNOSIS — Z79899 Other long term (current) drug therapy: Secondary | ICD-10-CM | POA: Diagnosis not present

## 2017-11-04 DIAGNOSIS — F411 Generalized anxiety disorder: Secondary | ICD-10-CM | POA: Diagnosis not present

## 2017-11-04 DIAGNOSIS — F902 Attention-deficit hyperactivity disorder, combined type: Secondary | ICD-10-CM | POA: Diagnosis not present

## 2017-11-25 DIAGNOSIS — M25511 Pain in right shoulder: Secondary | ICD-10-CM | POA: Diagnosis not present

## 2017-12-04 ENCOUNTER — Other Ambulatory Visit: Payer: Self-pay | Admitting: Family

## 2017-12-04 DIAGNOSIS — I10 Essential (primary) hypertension: Secondary | ICD-10-CM

## 2017-12-07 ENCOUNTER — Telehealth: Payer: Self-pay | Admitting: General Practice

## 2017-12-07 DIAGNOSIS — I10 Essential (primary) hypertension: Secondary | ICD-10-CM

## 2017-12-07 NOTE — Telephone Encounter (Signed)
Copied from No Name [id number]. Topic: Quick Communication - See Telephone Encounter >> Dec 07, 2017 10:27 AM Conception Chancy, NT wrote: CRM for notification. See Telephone encounter for:  12/07/17.  Loma Sousa is calling from [facility] and states she has sent a request over for lisiniprol; amlodipine. Please advise when able. States she knows his NP has left the office but states he has seen other providers here and needs these refill sent over. Thank you

## 2017-12-08 ENCOUNTER — Other Ambulatory Visit: Payer: Self-pay | Admitting: Family

## 2017-12-08 DIAGNOSIS — I10 Essential (primary) hypertension: Secondary | ICD-10-CM

## 2017-12-08 MED ORDER — AMLODIPINE BESYLATE 10 MG PO TABS: 10.0000 mg | ORAL_TABLET | Freq: Every day | ORAL | 1 refills | Status: DC

## 2017-12-08 MED ORDER — LISINOPRIL 20 MG PO TABS: 20.0000 mg | ORAL_TABLET | Freq: Every day | ORAL | 1 refills | Status: DC

## 2017-12-08 NOTE — Telephone Encounter (Signed)
Patient has a Transfer care/CPE appointment with Dr.John on 07/14/18.

## 2017-12-08 NOTE — Telephone Encounter (Signed)
Former pt of [facility] asking for refill of Amlodipine and Lisinopril.Last physical was on 07/07/17. Pt has been seen in office by other providers for acute visits recently.

## 2017-12-08 NOTE — Telephone Encounter (Signed)
Per office policy sent new rx to local pharmacy until appt...[name]

## 2017-12-09 ENCOUNTER — Telehealth: Payer: Self-pay | Admitting: Internal Medicine

## 2017-12-09 DIAGNOSIS — M1611 Unilateral primary osteoarthritis, right hip: Secondary | ICD-10-CM | POA: Diagnosis not present

## 2017-12-09 DIAGNOSIS — M25551 Pain in right hip: Secondary | ICD-10-CM | POA: Diagnosis not present

## 2017-12-09 NOTE — Telephone Encounter (Signed)
Copied from [place]. Topic: Quick Communication - See Telephone Encounter >> Dec 09, 2017  2:51 PM Burnis Medin, NT wrote: CRM for notification. See Telephone encounter for: Pt, is calling for a refill on lisinopril (PRINIVIL,ZESTRIL) 20 MG tablet, amLODipine (NORVASC) 10 MG tablet and pantoprazole (PROTONIX) 40 MG tablet. Pt is a transfer patient from Dr. Elna Breslow to [facility] as new PCP. Pt uses [facility] in [place].  12/09/17.

## 2017-12-12 ENCOUNTER — Other Ambulatory Visit: Payer: Self-pay

## 2017-12-12 MED ORDER — PANTOPRAZOLE SODIUM 40 MG PO TBEC: 40.0000 mg | DELAYED_RELEASE_TABLET | Freq: Every day | ORAL | 3 refills | Status: DC

## 2018-01-02 DIAGNOSIS — M47812 Spondylosis without myelopathy or radiculopathy, cervical region: Secondary | ICD-10-CM | POA: Diagnosis not present

## 2018-01-02 DIAGNOSIS — M953 Acquired deformity of neck: Secondary | ICD-10-CM | POA: Diagnosis not present

## 2018-01-02 DIAGNOSIS — M5031 Other cervical disc degeneration,  high cervical region: Secondary | ICD-10-CM | POA: Diagnosis not present

## 2018-01-06 DIAGNOSIS — M9931 Osseous stenosis of neural canal of cervical region: Secondary | ICD-10-CM | POA: Diagnosis not present

## 2018-01-06 DIAGNOSIS — M9971 Connective tissue and disc stenosis of intervertebral foramina of cervical region: Secondary | ICD-10-CM | POA: Diagnosis not present

## 2018-01-06 DIAGNOSIS — M50322 Other cervical disc degeneration at C5-C6 level: Secondary | ICD-10-CM | POA: Diagnosis not present

## 2018-01-06 DIAGNOSIS — M47892 Other spondylosis, cervical region: Secondary | ICD-10-CM | POA: Diagnosis not present

## 2018-01-11 DIAGNOSIS — M542 Cervicalgia: Secondary | ICD-10-CM | POA: Diagnosis not present

## 2018-01-11 DIAGNOSIS — M25551 Pain in right hip: Secondary | ICD-10-CM | POA: Diagnosis not present

## 2018-01-19 DIAGNOSIS — M24851 Other specific joint derangements of right hip, not elsewhere classified: Secondary | ICD-10-CM | POA: Diagnosis not present

## 2018-01-19 DIAGNOSIS — M25551 Pain in right hip: Secondary | ICD-10-CM | POA: Diagnosis not present

## 2018-01-19 DIAGNOSIS — M24151 Other articular cartilage disorders, right hip: Secondary | ICD-10-CM | POA: Diagnosis not present

## 2018-01-19 DIAGNOSIS — M1611 Unilateral primary osteoarthritis, right hip: Secondary | ICD-10-CM | POA: Diagnosis not present

## 2018-01-25 DIAGNOSIS — M25551 Pain in right hip: Secondary | ICD-10-CM | POA: Diagnosis not present

## 2018-02-03 DIAGNOSIS — I10 Essential (primary) hypertension: Secondary | ICD-10-CM | POA: Diagnosis not present

## 2018-02-03 DIAGNOSIS — F411 Generalized anxiety disorder: Secondary | ICD-10-CM | POA: Diagnosis not present

## 2018-02-03 DIAGNOSIS — Z79899 Other long term (current) drug therapy: Secondary | ICD-10-CM | POA: Diagnosis not present

## 2018-02-03 DIAGNOSIS — F902 Attention-deficit hyperactivity disorder, combined type: Secondary | ICD-10-CM | POA: Diagnosis not present

## 2018-02-09 DIAGNOSIS — S73199A Other sprain of unspecified hip, initial encounter: Secondary | ICD-10-CM | POA: Insufficient documentation

## 2018-02-09 DIAGNOSIS — M25851 Other specified joint disorders, right hip: Secondary | ICD-10-CM | POA: Diagnosis not present

## 2018-02-09 DIAGNOSIS — M24159 Other articular cartilage disorders, unspecified hip: Secondary | ICD-10-CM | POA: Diagnosis not present

## 2018-02-10 ENCOUNTER — Ambulatory Visit: Payer: Self-pay | Admitting: Family Medicine

## 2018-02-10 ENCOUNTER — Ambulatory Visit: Payer: BLUE CROSS/BLUE SHIELD | Admitting: Family Medicine

## 2018-02-10 ENCOUNTER — Encounter: Payer: Self-pay | Admitting: Family Medicine

## 2018-02-10 DIAGNOSIS — I10 Essential (primary) hypertension: Secondary | ICD-10-CM

## 2018-02-10 NOTE — Progress Notes (Signed)
[name] - 57 y.o. male MRN [id number]  Date of birth: 08-04-1961  SUBJECTIVE:  Including CC & ROS.  Chief Complaint  Patient presents with  . Surgical clearance    [name] is a 57 y.o. male that is presenting with surgical clearance for right hip scope scheduled on 02/28/18. No recent changes in history.   Review of the right hip MRA on 01/19/2018 from [facility] shows mild sclerosis of the superior labrum right acetabulum and mild synovial thickening of the joint with linear areas of high signal intensity apparently extending into the lateral labrum and the frog-leg view.  Pt is here for preoperative clearance for hip arthroscopy  1) High Risk Cardiac Conditions  1) Recent MI - No.  2) Decompensated Heart Failure - No.  3) unstable angina - No.  4) Symptomatic arrythmia - No.  5) Sx Valvular Disease - No.  2) Intermediate Risk Factors - DM, CKD, CVA, CHF, CAD - No.  2) Functional Status - > 4 mets ( Walk, run, climb stairs ) Yes.    3) Surgery Specific Risk - High  (Emergency, Vascular, Extensive ops)          Intermediate (Carotid, Head and Neck, Orthopaedic )          Low (Endoscopic, Cataract, Breast )  4) Further Noninvasive evaluation -   1) EKG - No.   1) Hx of CVA, CAD, DM, CKD  2) Echo - No.   1) Worsening dyspnea   3) Stress Testing - Active Cardiac Disease - No.  5) Need for medical therapy - Beta Blocker, Statins indicated ? No.   Review of Systems  Constitutional: Negative for fever.  Respiratory: Negative for cough.   Cardiovascular: Negative for chest pain.  Gastrointestinal: Negative for abdominal pain.  Musculoskeletal: Positive for arthralgias.  Neurological: Negative for weakness.  Hematological: Negative for adenopathy.  Psychiatric/Behavioral: Negative for agitation.    HISTORY: Past Medical, Surgical, Social, and Family History Reviewed & Updated per EMR.   Pertinent Historical Findings include:  Past Medical History:  Diagnosis  Date  . Allergy   . Asthma    bronchitis  . Colon polyps   . GERD (gastroesophageal reflux disease)   . Hypertension   . MRSA infection 09/07/2016  . Pleurisy    hx-12/13  . Rectal fissure   . Seasonal allergies   . Skin cancer     Past Surgical History:  Procedure Laterality Date  . APPENDECTOMY  2006   lap append  . CHOLECYSTECTOMY    . [place]  2005  . I&D EXTREMITY Left 07/30/2016   Procedure: left knee arthroscopic wash out;  Surgeon: Melrose Nakayama, MD;  Location: [facility];  Service: Orthopedics;  Laterality: Left;  . I&D EXTREMITY Left 08/06/2016   Procedure: IRRIGATION AND DEBRIDEMENT LEFT KNEE W/ SYNOVECTOMY;  Surgeon: Melrose Nakayama, MD;  Location: [facility];  Service: Orthopedics;  Laterality: Left;  . KNEE ARTHROSCOPY  2009   left  . KNEE ARTHROSCOPY Left 06/12/2013   Procedure: LEFT KNEE ARTHROSCOPY PARTIAL MEDIAL MENISECTOMY WITH CHONDROPLASTY;  Surgeon: Hessie Dibble, MD;  Location: [facility];  Service: Orthopedics;  Laterality: Left;  . KNEE SURGERY  2005   lt acl  . NASAL FRACTURE SURGERY  2012   closed reduction   . SHOULDER SURGERY  2010   left    Allergies  Allergen Reactions  . Codeine Itching, Nausea Only and Rash    Family History  Problem  Relation Age of Onset  . Heart attack Mother   . Skin cancer Mother   . Hypertension Mother   . Hyperlipidemia Mother   . Stroke Mother   . Heart attack Father   . Bipolar disorder Maternal Grandmother   . Arthritis Maternal Grandmother   . Hyperlipidemia Maternal Grandmother   . Hypertension Maternal Grandmother   . Arthritis Maternal Grandfather   . Hyperlipidemia Maternal Grandfather   . Stroke Maternal Grandfather   . Hypertension Maternal Grandfather   . Arthritis Paternal Grandmother   . Hypertension Paternal Grandmother   . Arthritis Paternal Grandfather   . Hypertension Paternal Grandfather   . Hyperlipidemia Brother      Social History   Socioeconomic History  .  Marital status: Married    Spouse name: Not on file  . Number of children: 5  . Years of education: 47  . Highest education level: Not on file  Social Needs  . Financial resource strain: Not on file  . Food insecurity - worry: Not on file  . Food insecurity - inability: Not on file  . Transportation needs - medical: Not on file  . Transportation needs - non-medical: Not on file  Occupational History  . Occupation: Teacher, music: [employer]  Tobacco Use  . Smoking status: Former Smoker    Packs/day: 0.25    Years: 4.00    Pack years: 1.00    Types: Cigarettes, Cigars    Last attempt to quit: 12/27/1984    Years since quitting: 33.1  . Smokeless tobacco: Never Used  . Tobacco comment: occasional  Substance and Sexual Activity  . Alcohol use: Yes    Comment: occ  . Drug use: Yes    Frequency: 2.0 times per week    Types: Marijuana  . Sexual activity: Not on file    Comment: occ cigars-quit cigs  Other Topics Concern  . Not on file  Social History Narrative   Former Company secretary      PHYSICAL EXAM:  VS: BP (!) 142/78 (BP Location: Left Arm, Patient Position: Sitting, Cuff Size: Normal)   Pulse 88   Temp 97.8 F (36.6 C) (Oral)   Ht 5\' 9"  (1.753 m)   Wt 202 lb (91.6 kg)   SpO2 96%   BMI 29.83 kg/m  Physical Exam Gen: NAD, alert, cooperative with exam, well-appearing ENT: normal lips, normal nasal mucosa, tympanic membranes clear and intact bilaterally, normal oropharynx, no cervical lymphadenopathy Eye: normal EOM, normal conjunctiva and lids CV:  no edema, +2 pedal pulses, regular rate and rhythm, S1-S2   Resp: no accessory muscle use, non-labored, clear to auscultation bilaterally, no crackles or wheezes Skin: no rashes, no areas of induration  Neuro: normal tone, normal sensation to touch Psych:  normal insight, alert and oriented MSK: Normal gait, normal strength      ASSESSMENT & PLAN:   Essential hypertension Mildly elevated. - provided pre-op  clearance for hip scope  - will schedule an appt for physical

## 2018-02-10 NOTE — Assessment & Plan Note (Addendum)
Mildly elevated. - provided pre-op clearance for hip scope  - will schedule an appt for physical

## 2018-03-06 DIAGNOSIS — M25851 Other specified joint disorders, right hip: Secondary | ICD-10-CM | POA: Diagnosis not present

## 2018-03-06 DIAGNOSIS — M25351 Other instability, right hip: Secondary | ICD-10-CM | POA: Diagnosis not present

## 2018-03-06 DIAGNOSIS — M24151 Other articular cartilage disorders, right hip: Secondary | ICD-10-CM | POA: Diagnosis not present

## 2018-03-06 DIAGNOSIS — M24851 Other specific joint derangements of right hip, not elsewhere classified: Secondary | ICD-10-CM | POA: Diagnosis not present

## 2018-03-06 HISTORY — PX: HIP SURGERY: SHX245

## 2018-03-13 ENCOUNTER — Other Ambulatory Visit: Payer: Self-pay | Admitting: Internal Medicine

## 2018-03-13 DIAGNOSIS — I10 Essential (primary) hypertension: Secondary | ICD-10-CM

## 2018-03-13 DIAGNOSIS — M25851 Other specified joint disorders, right hip: Secondary | ICD-10-CM | POA: Diagnosis not present

## 2018-03-16 DIAGNOSIS — M25851 Other specified joint disorders, right hip: Secondary | ICD-10-CM | POA: Diagnosis not present

## 2018-03-23 DIAGNOSIS — M25851 Other specified joint disorders, right hip: Secondary | ICD-10-CM | POA: Diagnosis not present

## 2018-03-28 DIAGNOSIS — M25851 Other specified joint disorders, right hip: Secondary | ICD-10-CM | POA: Diagnosis not present

## 2018-03-28 DIAGNOSIS — M24159 Other articular cartilage disorders, unspecified hip: Secondary | ICD-10-CM | POA: Diagnosis not present

## 2018-04-04 DIAGNOSIS — M25552 Pain in left hip: Secondary | ICD-10-CM | POA: Diagnosis not present

## 2018-04-13 ENCOUNTER — Other Ambulatory Visit: Payer: Self-pay | Admitting: Internal Medicine

## 2018-04-16 DIAGNOSIS — F329 Major depressive disorder, single episode, unspecified: Secondary | ICD-10-CM | POA: Diagnosis not present

## 2018-04-16 DIAGNOSIS — M50323 Other cervical disc degeneration at C6-C7 level: Secondary | ICD-10-CM | POA: Diagnosis not present

## 2018-04-16 DIAGNOSIS — F17210 Nicotine dependence, cigarettes, uncomplicated: Secondary | ICD-10-CM | POA: Diagnosis not present

## 2018-04-16 DIAGNOSIS — S0990XA Unspecified injury of head, initial encounter: Secondary | ICD-10-CM | POA: Diagnosis not present

## 2018-04-16 DIAGNOSIS — S01112A Laceration without foreign body of left eyelid and periocular area, initial encounter: Secondary | ICD-10-CM | POA: Diagnosis not present

## 2018-04-16 DIAGNOSIS — Z79899 Other long term (current) drug therapy: Secondary | ICD-10-CM | POA: Diagnosis not present

## 2018-04-16 DIAGNOSIS — J328 Other chronic sinusitis: Secondary | ICD-10-CM | POA: Diagnosis not present

## 2018-04-16 DIAGNOSIS — K219 Gastro-esophageal reflux disease without esophagitis: Secondary | ICD-10-CM | POA: Diagnosis not present

## 2018-04-16 DIAGNOSIS — Z888 Allergy status to other drugs, medicaments and biological substances status: Secondary | ICD-10-CM | POA: Diagnosis not present

## 2018-04-16 DIAGNOSIS — W19XXXA Unspecified fall, initial encounter: Secondary | ICD-10-CM | POA: Diagnosis not present

## 2018-04-16 DIAGNOSIS — Y9389 Activity, other specified: Secondary | ICD-10-CM | POA: Diagnosis not present

## 2018-04-16 DIAGNOSIS — I10 Essential (primary) hypertension: Secondary | ICD-10-CM | POA: Diagnosis not present

## 2018-04-16 DIAGNOSIS — S199XXA Unspecified injury of neck, initial encounter: Secondary | ICD-10-CM | POA: Diagnosis not present

## 2018-04-16 DIAGNOSIS — M50321 Other cervical disc degeneration at C4-C5 level: Secondary | ICD-10-CM | POA: Diagnosis not present

## 2018-04-16 DIAGNOSIS — R55 Syncope and collapse: Secondary | ICD-10-CM | POA: Diagnosis not present

## 2018-04-16 DIAGNOSIS — E785 Hyperlipidemia, unspecified: Secondary | ICD-10-CM | POA: Diagnosis not present

## 2018-04-16 DIAGNOSIS — R9431 Abnormal electrocardiogram [ECG] [EKG]: Secondary | ICD-10-CM | POA: Diagnosis not present

## 2018-04-25 ENCOUNTER — Encounter: Payer: Self-pay | Admitting: Internal Medicine

## 2018-04-25 ENCOUNTER — Ambulatory Visit: Payer: BLUE CROSS/BLUE SHIELD | Admitting: Internal Medicine

## 2018-04-25 DIAGNOSIS — R55 Syncope and collapse: Secondary | ICD-10-CM

## 2018-04-25 DIAGNOSIS — S01112A Laceration without foreign body of left eyelid and periocular area, initial encounter: Secondary | ICD-10-CM | POA: Diagnosis not present

## 2018-04-25 NOTE — Assessment & Plan Note (Signed)
Likely vasovagal from aborted choking on coffee. Denies prior episode. Does not require further workup after ER evaluation. If any recurrence needs re-evaluation. No concussion symptoms today. Not taking any pain medication now.

## 2018-04-25 NOTE — Progress Notes (Signed)
   Subjective:    Patient ID: [name], male    DOB: January 30, 1961, 57 y.o.   MRN: [id number]  HPI The patient is a 57 YO man coming in for ER follow up and stitch removal. He was in for vasovagal syncope and work up with EKG and CT head. Laceration sutured above his left eye with 7 stitches and told to come in for removal in about 1 week. He has felt some tired after the syncope for a couple days and wonders if he had a concussion. Denies confusion or headaches or nausea or vomiting. Denies recurrent syncope. Denies prior syncope. No chest pains, SOB, palpitations.   PMH, [facility], social history reviewed and updated.   Review of Systems  Constitutional: Negative.   HENT: Negative.   Eyes: Negative.   Respiratory: Negative for cough, chest tightness and shortness of breath.   Cardiovascular: Negative for chest pain, palpitations and leg swelling.  Gastrointestinal: Negative for abdominal distention, abdominal pain, constipation, diarrhea, nausea and vomiting.  Musculoskeletal: Negative.   Skin: Positive for wound.  Neurological: Negative.   Psychiatric/Behavioral: Negative.       Objective:   Physical Exam  Constitutional: He is oriented to person, place, and time. He appears well-developed and well-nourished.  HENT:  Head: Normocephalic and atraumatic.  Eyes: EOM are normal.  Neck: Normal range of motion.  Cardiovascular: Normal rate and regular rhythm.  Pulmonary/Chest: Effort normal and breath sounds normal. No respiratory distress. He has no wheezes. He has no rales.  Abdominal: Soft. Bowel sounds are normal. He exhibits no distension. There is no tenderness. There is no rebound.  Musculoskeletal: He exhibits no edema.  Neurological: He is alert and oriented to person, place, and time. Coordination normal.  Skin: Skin is warm and dry.  7 sutures removed from above the left eyebrow, scabbing intact and no abscess or purulent drainage. No bleeding during removal.   Psychiatric:  He has a normal mood and affect.   Vitals:   04/25/18 1328  BP: 130/86  Pulse: 95  Temp: 98.7 F (37.1 C)  TempSrc: Oral  SpO2: 97%  Weight: 201 lb (91.2 kg)  Height: 5\' 9"  (1.753 m)   Suture Removal Patient here for suture removal. He obtained a laceration 7 days ago, which required closure with 7 sutures. Mechanism of injury: syncope. He denies pain, redness, or drainage from the wound. His last tetanus was 7 days ago. 7sutures removed in the office without complication.      Assessment & Plan:

## 2018-04-25 NOTE — Assessment & Plan Note (Addendum)
7 sutures removed in the office procedure tolerated without pain or complication.

## 2018-04-25 NOTE — Patient Instructions (Signed)
We have taken out the stitches. It is okay to use vaseline or vitamin E on the area to help reduce scarring.

## 2018-05-08 DIAGNOSIS — F902 Attention-deficit hyperactivity disorder, combined type: Secondary | ICD-10-CM | POA: Diagnosis not present

## 2018-05-08 DIAGNOSIS — Z79899 Other long term (current) drug therapy: Secondary | ICD-10-CM | POA: Diagnosis not present

## 2018-05-17 DIAGNOSIS — M25851 Other specified joint disorders, right hip: Secondary | ICD-10-CM | POA: Diagnosis not present

## 2018-05-23 DIAGNOSIS — M25551 Pain in right hip: Secondary | ICD-10-CM | POA: Diagnosis not present

## 2018-06-08 ENCOUNTER — Other Ambulatory Visit: Payer: Self-pay | Admitting: Internal Medicine

## 2018-06-08 DIAGNOSIS — I10 Essential (primary) hypertension: Secondary | ICD-10-CM

## 2018-06-19 DIAGNOSIS — L570 Actinic keratosis: Secondary | ICD-10-CM | POA: Diagnosis not present

## 2018-06-19 DIAGNOSIS — D225 Melanocytic nevi of trunk: Secondary | ICD-10-CM | POA: Diagnosis not present

## 2018-06-19 DIAGNOSIS — Z85828 Personal history of other malignant neoplasm of skin: Secondary | ICD-10-CM | POA: Diagnosis not present

## 2018-06-19 DIAGNOSIS — L821 Other seborrheic keratosis: Secondary | ICD-10-CM | POA: Diagnosis not present

## 2018-06-19 DIAGNOSIS — L245 Irritant contact dermatitis due to other chemical products: Secondary | ICD-10-CM | POA: Diagnosis not present

## 2018-07-10 ENCOUNTER — Encounter: Payer: Self-pay | Admitting: Internal Medicine

## 2018-07-10 ENCOUNTER — Ambulatory Visit (INDEPENDENT_AMBULATORY_CARE_PROVIDER_SITE_OTHER): Payer: BLUE CROSS/BLUE SHIELD | Admitting: Internal Medicine

## 2018-07-10 ENCOUNTER — Other Ambulatory Visit (INDEPENDENT_AMBULATORY_CARE_PROVIDER_SITE_OTHER): Payer: BLUE CROSS/BLUE SHIELD

## 2018-07-10 VITALS — BP 136/88 | HR 98 | Temp 98.2°F | Ht 69.0 in | Wt 198.0 lb

## 2018-07-10 DIAGNOSIS — R739 Hyperglycemia, unspecified: Secondary | ICD-10-CM | POA: Insufficient documentation

## 2018-07-10 DIAGNOSIS — Z0000 Encounter for general adult medical examination without abnormal findings: Secondary | ICD-10-CM | POA: Diagnosis not present

## 2018-07-10 DIAGNOSIS — N529 Male erectile dysfunction, unspecified: Secondary | ICD-10-CM

## 2018-07-10 LAB — CBC WITH DIFFERENTIAL/PLATELET
BASOS PCT: 0.5 % (ref 0.0–3.0)
Basophils Absolute: 0 10*3/uL (ref 0.0–0.1)
Eosinophils Absolute: 0.1 10*3/uL (ref 0.0–0.7)
Eosinophils Relative: 1.5 % (ref 0.0–5.0)
HCT: 43.4 % (ref 39.0–52.0)
Hemoglobin: 14.8 g/dL (ref 13.0–17.0)
LYMPHS ABS: 2.2 10*3/uL (ref 0.7–4.0)
Lymphocytes Relative: 34 % (ref 12.0–46.0)
MCHC: 34.1 g/dL (ref 30.0–36.0)
MCV: 89 fl (ref 78.0–100.0)
MONO ABS: 0.5 10*3/uL (ref 0.1–1.0)
Monocytes Relative: 7.8 % (ref 3.0–12.0)
NEUTROS ABS: 3.6 10*3/uL (ref 1.4–7.7)
NEUTROS PCT: 56.2 % (ref 43.0–77.0)
Platelets: 294 10*3/uL (ref 150.0–400.0)
RBC: 4.88 Mil/uL (ref 4.22–5.81)
RDW: 13.7 % (ref 11.5–15.5)
WBC: 6.4 10*3/uL (ref 4.0–10.5)

## 2018-07-10 LAB — URINALYSIS, ROUTINE W REFLEX MICROSCOPIC
Bilirubin Urine: NEGATIVE
Hgb urine dipstick: NEGATIVE
Ketones, ur: NEGATIVE
Leukocytes, UA: NEGATIVE
Nitrite: NEGATIVE
PH: 6.5 (ref 5.0–8.0)
SPECIFIC GRAVITY, URINE: 1.025 (ref 1.000–1.030)
TOTAL PROTEIN, URINE-UPE24: NEGATIVE
URINE GLUCOSE: NEGATIVE
Urobilinogen, UA: 0.2 (ref 0.0–1.0)

## 2018-07-10 LAB — PSA: PSA: 1.16 ng/mL (ref 0.10–4.00)

## 2018-07-10 LAB — BASIC METABOLIC PANEL
BUN: 18 mg/dL (ref 6–23)
CALCIUM: 9.7 mg/dL (ref 8.4–10.5)
CHLORIDE: 106 meq/L (ref 96–112)
CO2: 26 meq/L (ref 19–32)
Creatinine, Ser: 1.08 mg/dL (ref 0.40–1.50)
GFR: 74.69 mL/min (ref >60.00)
Glucose, Bld: 102 mg/dL — ABNORMAL HIGH (ref 70–99)
Potassium: 5.2 mEq/L — ABNORMAL HIGH (ref 3.5–5.1)
SODIUM: 141 meq/L (ref 135–145)

## 2018-07-10 LAB — LIPID PANEL
CHOL/HDL RATIO: 4
CHOLESTEROL: 179 mg/dL (ref 0–200)
HDL: 49.7 mg/dL (ref >39.00)
NonHDL: 129.69
TRIGLYCERIDES: 236 mg/dL — AB (ref 0.0–149.0)
VLDL: 47.2 mg/dL — ABNORMAL HIGH (ref 0.0–40.0)

## 2018-07-10 LAB — HEMOGLOBIN A1C: Hgb A1c MFr Bld: 5.8 % (ref 4.6–6.5)

## 2018-07-10 LAB — HEPATIC FUNCTION PANEL
ALBUMIN: 4.7 g/dL (ref 3.5–5.2)
ALT: 27 U/L (ref 0–53)
AST: 16 U/L (ref 0–37)
Alkaline Phosphatase: 51 U/L (ref 39–117)
Bilirubin, Direct: 0.1 mg/dL (ref 0.0–0.3)
TOTAL PROTEIN: 7 g/dL (ref 6.0–8.3)
Total Bilirubin: 0.5 mg/dL (ref 0.2–1.2)

## 2018-07-10 LAB — TSH: TSH: 1.48 u[IU]/mL (ref 0.35–4.50)

## 2018-07-10 LAB — LDL CHOLESTEROL, DIRECT: Direct LDL: 95 mg/dL

## 2018-07-10 MED ORDER — SILDENAFIL CITRATE 100 MG PO TABS: 50.0000 mg | ORAL_TABLET | Freq: Every day | ORAL | 11 refills | Status: AC | PRN

## 2018-07-10 NOTE — Patient Instructions (Addendum)
Please take all new medication as prescribed - the viagra  Please continue all other medications as before, and refills have been done if requested.  Please have the pharmacy call with any other refills you may need.  Please continue your efforts at being more active, low cholesterol diet, and weight control.  You are otherwise up to date with prevention measures today.  Please keep your appointments with your specialists as you may have planned  Please go to the LAB in the Basement (turn left off the elevator) for the tests to be done today  You will be contacted by phone if any changes need to be made immediately.  Otherwise, you will receive a letter about your results with an explanation, but please check with MyChart first.  Please remember to sign up for MyChart if you have not done so, as this will be important to you in the future with finding out test results, communicating by private email, and scheduling acute appointments online when needed.  Please return in 1 year for your yearly visit, or sooner if needed, with Lab testing done 3-5 days before  

## 2018-07-10 NOTE — Assessment & Plan Note (Signed)
Also for a1c with labs 

## 2018-07-10 NOTE — Assessment & Plan Note (Signed)

## 2018-07-10 NOTE — Progress Notes (Signed)
Subjective:    Patient ID: [name], male    DOB: 1961/07/25, 57 y.o.   MRN: [id number]  HPI  Here for wellness and f/u;  Overall doing ok;  Pt denies Chest pain, worsening SOB, DOE, wheezing, orthopnea, PND, worsening LE edema, palpitations, dizziness or syncope.  Pt denies neurological change such as new headache, facial or extremity weakness.  Pt denies polydipsia, polyuria, or low sugar symptoms. Pt states overall good compliance with treatment and medications, good tolerability, and has been trying to follow appropriate diet.  Pt denies worsening depressive symptoms, suicidal ideation or panic. No fever, night sweats, wt loss, loss of appetite, or other constitutional symptoms.  Pt states good ability with ADL's, has low fall risk, home safety reviewed and adequate, no other significant changes in hearing or vision, and only occasionally active with exercise.  May be considering left knee TKR and right hip THA later this yr.  Has mild worsening stress and depression and chronic pain .  Wife losing her job, 61yo daughter lost a baby, being sued over a home he sold to a buyer apparently not happy. Also asks for ED med such as viagra due to worsening ED over past year Past Medical History:  Diagnosis Date  . Allergy   . Asthma    bronchitis  . Colon polyps   . GERD (gastroesophageal reflux disease)   . Hypertension   . MRSA infection 09/07/2016  . Pleurisy    hx-12/13  . Rectal fissure   . Seasonal allergies   . Skin cancer    Past Surgical History:  Procedure Laterality Date  . APPENDECTOMY  2006   lap append  . CHOLECYSTECTOMY    . [place]  2005  . HIP SURGERY Right 03/06/2018  . I&D EXTREMITY Left 07/30/2016   Procedure: left knee arthroscopic wash out;  Surgeon: Melrose Nakayama, MD;  Location: [facility];  Service: Orthopedics;  Laterality: Left;  . I&D EXTREMITY Left 08/06/2016   Procedure: IRRIGATION AND DEBRIDEMENT LEFT KNEE W/ SYNOVECTOMY;  Surgeon: Melrose Nakayama,  MD;  Location: [facility];  Service: Orthopedics;  Laterality: Left;  . KNEE ARTHROSCOPY  2009   left  . KNEE ARTHROSCOPY Left 06/12/2013   Procedure: LEFT KNEE ARTHROSCOPY PARTIAL MEDIAL MENISECTOMY WITH CHONDROPLASTY;  Surgeon: Hessie Dibble, MD;  Location: [facility];  Service: Orthopedics;  Laterality: Left;  . KNEE SURGERY  2005   lt acl  . NASAL FRACTURE SURGERY  2012   closed reduction   . SHOULDER SURGERY  2010   left    reports that he quit smoking about 33 years ago. His smoking use included cigarettes and cigars. He has a 1.00 pack-year smoking history. He has never used smokeless tobacco. He reports that he drinks alcohol. He reports that he has current or past drug history. Drug: Marijuana. Frequency: 2.00 times per week. family history includes Arthritis in his maternal grandfather, maternal grandmother, paternal grandfather, and paternal grandmother; Bipolar disorder in his maternal grandmother; Heart attack in his father and mother; Hyperlipidemia in his brother, maternal grandfather, maternal grandmother, and mother; Hypertension in his maternal grandfather, maternal grandmother, mother, paternal grandfather, and paternal grandmother; Skin cancer in his mother; Stroke in his maternal grandfather and mother. Allergies  Allergen Reactions  . Codeine Itching, Nausea Only and Rash   Current Outpatient Medications on File Prior to Visit  Medication Sig Dispense Refill  . amLODipine (NORVASC) 10 MG tablet TAKE ONE TABLET BY MOUTH DAILY 90 tablet  1  . amphetamine-dextroamphetamine (ADDERALL) 30 MG tablet Take 30 mg by mouth daily.    [name] aspirin 81 MG EC tablet Take 1 tablet (81 mg total) by mouth daily. Swallow whole. 30 tablet 12  . calcium carbonate (TUMS - DOSED IN MG ELEMENTAL CALCIUM) 500 MG chewable tablet Chew 1-2 tablets by mouth as needed for heartburn.    . cetirizine (ZYRTEC) 10 MG tablet Take 10 mg by mouth daily.    . fluticasone (FLONASE) 50 MCG/ACT nasal  spray Place 2 sprays into both nostrils daily as needed for allergies.     [name] glucosamine-chondroitin 500-400 MG tablet Take 1 tablet by mouth 3 (three) times daily.    [name] HYDROcodone-acetaminophen (NORCO/VICODIN) 5-325 MG tablet Take 1 tablet by mouth every 6 (six) hours as needed for moderate pain.    [name] lisinopril (PRINIVIL,ZESTRIL) 20 MG tablet TAKE ONE TABLET BY MOUTH DAILY 90 tablet 0  . meloxicam (MOBIC) 15 MG tablet Take 1 tablet (15 mg total) by mouth daily. 30 tablet 0  . mupirocin cream (BACTROBAN) 2 % 1 application See admin instructions. Into both nostrils two times daily    . pantoprazole (PROTONIX) 40 MG tablet TAKE ONE TABLET BY MOUTH DAILY 30 tablet 2   No current facility-administered medications on file prior to visit.    ROS: Constitutional: Negative for other unusual diaphoresis, sweats, appetite or weight changes HENT: Negative for other worsening hearing loss, ear pain, facial swelling, mouth sores or neck stiffness.   Eyes: Negative for other worsening pain, redness or other visual disturbance.  Respiratory: Negative for other stridor or swelling Cardiovascular: Negative for other palpitations or other chest pain  Gastrointestinal: Negative for worsening diarrhea or loose stools, blood in stool, distention or other pain Genitourinary: Negative for hematuria, flank pain or other change in urine volume.  Musculoskeletal: Negative for myalgias or other joint swelling.  Skin: Negative for other color change, or other wound or worsening drainage.  Neurological: Negative for other syncope or numbness. Hematological: Negative for other adenopathy or swelling Psychiatric/Behavioral: Negative for hallucinations, other worsening agitation, SI, self-injury, or new decreased concentration All other system neg per pt    Objective:   Physical Exam BP 136/88   Pulse 98   Temp 98.2 F (36.8 C) (Oral)   Ht 5\' 9"  (1.753 m)   Wt 198 lb (89.8 kg)   SpO2 95%   BMI 29.24 kg/m  VS  noted,  Constitutional: Pt is oriented to person, place, and time. Appears well-developed and well-nourished, in no significant distress and comfortable Head: Normocephalic and atraumatic  Eyes: Conjunctivae and EOM are normal. Pupils are equal, round, and reactive to light Right Ear: External ear normal without discharge Left Ear: External ear normal without discharge Nose: Nose without discharge or deformity Mouth/Throat: Oropharynx is without other ulcerations and moist  Neck: Normal range of motion. Neck supple. No JVD present. No tracheal deviation present or significant neck LA or mass Cardiovascular: Normal rate, regular rhythm, normal heart sounds and intact distal pulses.   Pulmonary/Chest: WOB normal and breath sounds without rales or wheezing  Abdominal: Soft. Bowel sounds are normal. NT. No HSM  Musculoskeletal: Normal range of motion. Exhibits no edema Lymphadenopathy: Has no other cervical adenopathy.  Neurological: Pt is alert and oriented to person, place, and time. Pt has normal reflexes. No cranial nerve deficit. Motor grossly intact, Gait intact Skin: Skin is warm and dry. No rash noted or new ulcerations Psychiatric:  Has normal mood and affect. Behavior is  normal without agitation No other exam findings Lab Results  Component Value Date   WBC 6.4 07/10/2018   HGB 14.8 07/10/2018   HCT 43.4 07/10/2018   PLT 294.0 07/10/2018   GLUCOSE 102 (H) 07/10/2018   CHOL 179 07/10/2018   TRIG 236.0 (H) 07/10/2018   HDL 49.70 07/10/2018   LDLDIRECT 95.0 07/10/2018   LDLCALC 91 07/07/2017   ALT 27 07/10/2018   AST 16 07/10/2018   NA 141 07/10/2018   K 5.2 (H) 07/10/2018   CL 106 07/10/2018   CREATININE 1.08 07/10/2018   BUN 18 07/10/2018   CO2 26 07/10/2018   TSH 1.48 07/10/2018   PSA 1.16 07/10/2018   HGBA1C 5.8 07/10/2018       Assessment & Plan:

## 2018-07-10 NOTE — Assessment & Plan Note (Signed)
Ok for generic viagra prn,  to f/u any worsening symptoms or concerns 

## 2018-07-11 ENCOUNTER — Encounter: Payer: BLUE CROSS/BLUE SHIELD | Admitting: Internal Medicine

## 2018-07-21 ENCOUNTER — Other Ambulatory Visit: Payer: Self-pay | Admitting: Internal Medicine

## 2018-08-07 DIAGNOSIS — Z79899 Other long term (current) drug therapy: Secondary | ICD-10-CM | POA: Diagnosis not present

## 2018-08-07 DIAGNOSIS — F411 Generalized anxiety disorder: Secondary | ICD-10-CM | POA: Diagnosis not present

## 2018-08-07 DIAGNOSIS — F902 Attention-deficit hyperactivity disorder, combined type: Secondary | ICD-10-CM | POA: Diagnosis not present

## 2018-09-08 ENCOUNTER — Other Ambulatory Visit: Payer: Self-pay | Admitting: Internal Medicine

## 2018-09-08 DIAGNOSIS — I10 Essential (primary) hypertension: Secondary | ICD-10-CM

## 2018-10-02 ENCOUNTER — Other Ambulatory Visit: Payer: Self-pay | Admitting: Internal Medicine

## 2018-10-09 DIAGNOSIS — M25551 Pain in right hip: Secondary | ICD-10-CM | POA: Diagnosis not present

## 2018-10-09 DIAGNOSIS — M25562 Pain in left knee: Secondary | ICD-10-CM | POA: Diagnosis not present

## 2018-10-16 DIAGNOSIS — L0889 Other specified local infections of the skin and subcutaneous tissue: Secondary | ICD-10-CM | POA: Diagnosis not present

## 2018-10-16 DIAGNOSIS — L02212 Cutaneous abscess of back [any part, except buttock]: Secondary | ICD-10-CM | POA: Diagnosis not present

## 2018-11-27 DIAGNOSIS — F902 Attention-deficit hyperactivity disorder, combined type: Secondary | ICD-10-CM | POA: Diagnosis not present

## 2018-11-27 DIAGNOSIS — F411 Generalized anxiety disorder: Secondary | ICD-10-CM | POA: Diagnosis not present

## 2018-11-27 DIAGNOSIS — Z79899 Other long term (current) drug therapy: Secondary | ICD-10-CM | POA: Diagnosis not present

## 2018-12-01 ENCOUNTER — Telehealth: Payer: Self-pay | Admitting: Internal Medicine

## 2018-12-01 DIAGNOSIS — M79641 Pain in right hand: Secondary | ICD-10-CM | POA: Diagnosis not present

## 2018-12-01 DIAGNOSIS — M79642 Pain in left hand: Secondary | ICD-10-CM | POA: Diagnosis not present

## 2018-12-01 MED ORDER — PANTOPRAZOLE SODIUM 40 MG PO TBEC: 40.0000 mg | DELAYED_RELEASE_TABLET | Freq: Every day | ORAL | 1 refills | Status: DC

## 2018-12-01 NOTE — Telephone Encounter (Signed)
Patient is requesting a refill on pantoprazole (PROTONIX) 40 MG tablet sent to [facility] in [place] for a **90 day supply**.

## 2018-12-04 ENCOUNTER — Other Ambulatory Visit: Payer: Self-pay | Admitting: Internal Medicine

## 2018-12-04 DIAGNOSIS — I10 Essential (primary) hypertension: Secondary | ICD-10-CM

## 2018-12-18 DIAGNOSIS — K5730 Diverticulosis of large intestine without perforation or abscess without bleeding: Secondary | ICD-10-CM | POA: Diagnosis not present

## 2018-12-18 DIAGNOSIS — Z1211 Encounter for screening for malignant neoplasm of colon: Secondary | ICD-10-CM | POA: Diagnosis not present

## 2018-12-18 DIAGNOSIS — Z86010 Personal history of colonic polyps: Secondary | ICD-10-CM | POA: Diagnosis not present

## 2018-12-18 DIAGNOSIS — K640 First degree hemorrhoids: Secondary | ICD-10-CM | POA: Diagnosis not present

## 2018-12-25 DIAGNOSIS — M25551 Pain in right hip: Secondary | ICD-10-CM | POA: Diagnosis not present

## 2019-02-27 DIAGNOSIS — F902 Attention-deficit hyperactivity disorder, combined type: Secondary | ICD-10-CM | POA: Diagnosis not present

## 2019-02-27 DIAGNOSIS — F411 Generalized anxiety disorder: Secondary | ICD-10-CM | POA: Diagnosis not present

## 2019-02-27 DIAGNOSIS — Z79899 Other long term (current) drug therapy: Secondary | ICD-10-CM | POA: Diagnosis not present

## 2019-03-10 ENCOUNTER — Other Ambulatory Visit: Payer: Self-pay | Admitting: Internal Medicine

## 2019-03-10 DIAGNOSIS — I10 Essential (primary) hypertension: Secondary | ICD-10-CM

## 2019-03-12 ENCOUNTER — Other Ambulatory Visit: Payer: Self-pay

## 2019-03-12 ENCOUNTER — Ambulatory Visit (INDEPENDENT_AMBULATORY_CARE_PROVIDER_SITE_OTHER): Payer: BLUE CROSS/BLUE SHIELD | Admitting: Adult Health

## 2019-03-12 ENCOUNTER — Encounter: Payer: Self-pay | Admitting: Adult Health

## 2019-03-12 ENCOUNTER — Ambulatory Visit (INDEPENDENT_AMBULATORY_CARE_PROVIDER_SITE_OTHER): Payer: BLUE CROSS/BLUE SHIELD

## 2019-03-12 VITALS — BP 130/70 | HR 97 | Temp 98.6°F

## 2019-03-12 DIAGNOSIS — R6889 Other general symptoms and signs: Secondary | ICD-10-CM | POA: Diagnosis not present

## 2019-03-12 DIAGNOSIS — R05 Cough: Secondary | ICD-10-CM | POA: Diagnosis not present

## 2019-03-12 DIAGNOSIS — J4521 Mild intermittent asthma with (acute) exacerbation: Secondary | ICD-10-CM

## 2019-03-12 DIAGNOSIS — R059 Cough, unspecified: Secondary | ICD-10-CM

## 2019-03-12 DIAGNOSIS — J301 Allergic rhinitis due to pollen: Secondary | ICD-10-CM | POA: Diagnosis not present

## 2019-03-12 LAB — RESPIRATORY VIRUS PANEL
HMPV: NOT DETECTED
INFLUENZA A RNA: NOT DETECTED
INFLUENZA B RNA: NOT DETECTED
RSV RNA: NOT DETECTED

## 2019-03-12 MED ORDER — ALBUTEROL SULFATE HFA 108 (90 BASE) MCG/ACT IN AERS: 2.0000 | INHALATION_SPRAY | Freq: Four times a day (QID) | RESPIRATORY_TRACT | 6 refills | Status: DC | PRN

## 2019-03-12 NOTE — Assessment & Plan Note (Signed)
Suspect mild flare with AR  Add ICS/LABA inhaler  Chest xray today with no acute process  Control for triggers  Respiratory viral panel is neg .  COVID screening was neg (no flu, no travel, no known sick contact or high risk exposure , no new acute symptoms ) seems to be more chronic in nature   Plan  Patient Instructions  Restart Zyrtec 10mg  At bedtime   Saline nasal rinses As needed   Flonase 2 puffs Twice daily   Begin Symbicort 2 puffs Twice daily  , rinse after use.  Albuterol inhaler 2 puffs every 4hr as needed for wheezing .  Discuss with family MD that Lisinopril may cause or aggravate your cough .  Advance bland diet as tolerated. If diarrhea does not stop will need to call family MD  Push fluids  Chest xray today .  Practice social distancing .  Follow up with Dr. Lamonte Smith  In 4-6 weeks and As needed   Please contact office for sooner follow up if symptoms do not improve or worsen or seek emergency care          Just FYI on COVID 19 .   Coronavirus (COVID-19) Are you at risk?  Are you at risk for the Coronavirus (COVID-19)?  To be considered HIGH RISK for Coronavirus (COVID-19), you have to meet the following criteria:  . Traveled to [place], [place], [place], [place] or [place]; or in the [place] to [place], [place], [place], or [place]; and have fever, cough, and shortness of breath within the last 2 weeks of travel OR . Been in close contact with a person diagnosed with COVID-19 within the last 2 weeks and have fever, cough, and shortness of breath . IF YOU DO NOT MEET THESE CRITERIA, YOU ARE CONSIDERED LOW RISK FOR COVID-19.  What to do if you are HIGH RISK for COVID-19?  [name] If you are having a medical emergency, call 911. . Seek medical care right away. Before you go to a doctor's office, urgent care or emergency department, call ahead and tell them about your recent travel, contact with someone diagnosed with COVID-19, and your symptoms. You should  receive instructions from your physician's office regarding next steps of care.  . When you arrive at healthcare provider, tell the healthcare staff immediately you have returned from visiting [place], [place], [place], [place] or [place]; or traveled in the [place] to [place], [place], [place], or [place]; in the last two weeks or you have been in close contact with a person diagnosed with COVID-19 in the last 2 weeks.   . Tell the health care staff about your symptoms: fever, cough and shortness of breath. . After you have been seen by a medical provider, you will be either: o Tested for (COVID-19) and discharged home on quarantine except to seek medical care if symptoms worsen, and asked to  - Stay home and avoid contact with others until you get your results (4-5 days)  - Avoid travel on public transportation if possible (such as bus, train, or airplane) or o Sent to the Emergency Department by EMS for evaluation, COVID-19 testing, and possible admission depending on your condition and test results.  What to do if you are LOW RISK for COVID-19?  Reduce your risk of any infection by using the same precautions used for avoiding the common cold or flu:  [name] Wash your hands often with soap and warm water for at least 20  seconds.  If soap and water are not readily available, use an alcohol-based hand sanitizer with at least 60% alcohol.  . If coughing or sneezing, cover your mouth and nose by coughing or sneezing into the elbow areas of your shirt or coat, into a tissue or into your sleeve (not your hands). . Avoid shaking hands with others and consider head nods or verbal greetings only. . Avoid touching your eyes, nose, or mouth with unwashed hands.  . Avoid close contact with people who are sick. . Avoid places or events with large numbers of people in one location, like concerts or sporting events. . Carefully consider travel plans you have or are making. . If you are planning any  travel outside or inside the [place], visit the CDC's Travelers' Health webpage for the latest health notices. . If you have some symptoms but not all symptoms, continue to monitor at home and seek medical attention if your symptoms worsen. . If you are having a medical emergency, call 911.   [name] / e-Visit: [email or web address]         [facility]: [facility]: [phone]                   [facility]: [phone]

## 2019-03-12 NOTE — Assessment & Plan Note (Signed)
Flare   Plan  Patient Instructions  Restart Zyrtec 10mg  At bedtime   Saline nasal rinses As needed   Flonase 2 puffs Twice daily   Begin Symbicort 2 puffs Twice daily  , rinse after use.  Albuterol inhaler 2 puffs every 4hr as needed for wheezing .  Discuss with family MD that Lisinopril may cause or aggravate your cough .  Advance bland diet as tolerated. If diarrhea does not stop will need to call family MD  Push fluids  Chest xray today .  Practice social distancing .  Follow up with [name]  In 4-6 weeks and As needed   Please contact office for sooner follow up if symptoms do not improve or worsen or seek emergency care          Just FYI on COVID 19 .   Coronavirus (COVID-19) Are you at risk?  Are you at risk for the Coronavirus (COVID-19)?  To be considered HIGH RISK for Coronavirus (COVID-19), you have to meet the following criteria:  . Traveled to [place], [place], [place], [place] or [place]; or in the [place] to [place], [place], [place], or [place]; and have fever, cough, and shortness of breath within the last 2 weeks of travel OR . Been in close contact with a person diagnosed with COVID-19 within the last 2 weeks and have fever, cough, and shortness of breath . IF YOU DO NOT MEET THESE CRITERIA, YOU ARE CONSIDERED LOW RISK FOR COVID-19.  What to do if you are HIGH RISK for COVID-19?  [name] If you are having a medical emergency, call 911. . Seek medical care right away. Before you go to a doctor's office, urgent care or emergency department, call ahead and tell them about your recent travel, contact with someone diagnosed with COVID-19, and your symptoms. You should receive instructions from your physician's office regarding next steps of care.  . When you arrive at healthcare provider, tell the healthcare staff immediately you have returned from visiting [place], [place], [place], [place] or [place]; or traveled in the [place] to [place], [place], [place], or [place]; in the last two weeks or you have been in close contact with a person diagnosed with COVID-19 in the last 2 weeks.   . Tell the health care staff about your symptoms: fever, cough and shortness of breath. . After you have been seen by a medical provider, you will be either: o Tested for (COVID-19) and discharged home on quarantine except to seek medical care if symptoms worsen, and asked to  - Stay home and avoid contact with others until you get your results (4-5 days)  - Avoid travel on public transportation if possible (such as bus, train, or airplane) or o Sent to the Emergency Department by EMS for evaluation, COVID-19 testing, and possible admission depending on your condition and test results.  What to do if you are LOW RISK for COVID-19?  Reduce your risk of any infection by using the same precautions used for avoiding the common cold or flu:  [name] Wash your hands often with soap and warm water for at least 20 seconds.  If soap and water are not readily available, use an alcohol-based hand sanitizer with at least 60% alcohol.  . If coughing or sneezing, cover your mouth and nose by coughing or sneezing into the elbow areas of your shirt or coat, into a tissue or into your sleeve (not your hands). . Avoid shaking  hands with others and consider head nods or verbal greetings only. . Avoid touching your eyes, nose, or mouth with unwashed hands.  . Avoid close contact with people who are sick. . Avoid places or events with large numbers of people in one location, like concerts or sporting events. . Carefully consider travel plans you have or are making. . If you are planning any travel outside or inside the [place], visit the CDC's Travelers' Health webpage for the latest health notices. . If you have some symptoms but not all symptoms, continue to monitor at home and seek medical attention if your symptoms worsen. . If you are having a medical emergency, call  911.   [name] / e-Visit: [email or web address]         [facility]: [phone]  [facility]: [phone]                   [facility]: [phone]    '

## 2019-03-12 NOTE — Patient Instructions (Addendum)
Restart Zyrtec 10mg  At bedtime   Saline nasal rinses As needed   Flonase 2 puffs Twice daily   Begin Symbicort 2 puffs Twice daily  , rinse after use.  Albuterol inhaler 2 puffs every 4hr as needed for wheezing .  Discuss with family MD that Lisinopril may cause or aggravate your cough .  Advance bland diet as tolerated. If diarrhea does not stop will need to call family MD  Push fluids  Chest xray today .  Practice social distancing .  Follow up with Dr. Lamonte Sakai  In 4-6 weeks and As needed   Please contact office for sooner follow up if symptoms do not improve or worsen or seek emergency care          Just FYI on COVID 19 .   Coronavirus (COVID-19) Are you at risk?  Are you at risk for the Coronavirus (COVID-19)?  To be considered HIGH RISK for Coronavirus (COVID-19), you have to meet the following criteria:  . Traveled to [place], [place], [place], [place] or [place]; or in the [place] to [place], [place], [place], or [place]; and have fever, cough, and shortness of breath within the last 2 weeks of travel OR . Been in close contact with a person diagnosed with COVID-19 within the last 2 weeks and have fever, cough, and shortness of breath . IF YOU DO NOT MEET THESE CRITERIA, YOU ARE CONSIDERED LOW RISK FOR COVID-19.  What to do if you are HIGH RISK for COVID-19?  [name] If you are having a medical emergency, call 911. . Seek medical care right away. Before you go to a doctor's office, urgent care or emergency department, call ahead and tell them about your recent travel, contact with someone diagnosed with COVID-19, and your symptoms. You should receive instructions from your physician's office regarding next steps of care.  . When you arrive at healthcare provider, tell the healthcare staff immediately you have returned from visiting [place], [place], [place], [place] or [place]; or traveled in the [place] to [place], [place], [place], [place], or [place]; in the last  two weeks or you have been in close contact with a person diagnosed with COVID-19 in the last 2 weeks.   . Tell the health care staff about your symptoms: fever, cough and shortness of breath. . After you have been seen by a medical provider, you will be either: o Tested for (COVID-19) and discharged home on quarantine except to seek medical care if symptoms worsen, and asked to  - Stay home and avoid contact with others until you get your results (4-5 days)  - Avoid travel on public transportation if possible (such as bus, train, or airplane) or o Sent to the Emergency Department by EMS for evaluation, COVID-19 testing, and possible admission depending on your condition and test results.  What to do if you are LOW RISK for COVID-19?  Reduce your risk of any infection by using the same precautions used for avoiding the common cold or flu:  [name] Wash your hands often with soap and warm water for at least 20 seconds.  If soap and water are not readily available, use an alcohol-based hand sanitizer with at least 60% alcohol.  . If coughing or sneezing, cover your mouth and nose by coughing or sneezing into the elbow areas of your shirt or coat, into a tissue or into your sleeve (not your hands). . Avoid shaking hands with others and consider head nods or  verbal greetings only. . Avoid touching your eyes, nose, or mouth with unwashed hands.  . Avoid close contact with people who are sick. . Avoid places or events with large numbers of people in one location, like concerts or sporting events. . Carefully consider travel plans you have or are making. . If you are planning any travel outside or inside the [place], visit the CDC's Travelers' Health webpage for the latest health notices. . If you have some symptoms but not all symptoms, continue to monitor at home and seek medical attention if your symptoms worsen. . If you are having a medical emergency, call 911.   [place] / e-Visit: [email or web address]         [facility]: [facility]: [phone]                   [facility]: [phone]

## 2019-03-12 NOTE — Assessment & Plan Note (Signed)
Cough - most likely secondary to allergies  ACE inhibitor may be making worse   Plan  Patient Instructions  Restart Zyrtec 10mg  At bedtime   Saline nasal rinses As needed   Flonase 2 puffs Twice daily   Begin Symbicort 2 puffs Twice daily  , rinse after use.  Albuterol inhaler 2 puffs every 4hr as needed for wheezing .  Discuss with family MD that Lisinopril may cause or aggravate your cough .  Advance bland diet as tolerated. If diarrhea does not stop will need to call family MD  Push fluids  Chest xray today .  Practice social distancing .  Follow up with Dr. Lamonte Sakai  In 4-6 weeks and As needed   Please contact office for sooner follow up if symptoms do not improve or worsen or seek emergency care          Just FYI on COVID 19 .   Coronavirus (COVID-19) Are you at risk?  Are you at risk for the Coronavirus (COVID-19)?  To be considered HIGH RISK for Coronavirus (COVID-19), you have to meet the following criteria:  . Traveled to [place], [place], [place], [place] or [place]; or in the [place] to [place], [place], [place], or [place]; and have fever, cough, and shortness of breath within the last 2 weeks of travel OR . Been in close contact with a person diagnosed with COVID-19 within the last 2 weeks and have fever, cough, and shortness of breath . IF YOU DO NOT MEET THESE CRITERIA, YOU ARE CONSIDERED LOW RISK FOR COVID-19.  What to do if you are HIGH RISK for COVID-19?  [name] If you are having a medical emergency, call 911. . Seek medical care right away. Before you go to a doctor's office, urgent care or emergency department, call ahead and tell them about your recent travel, contact with someone diagnosed with COVID-19, and your symptoms. You should receive instructions from your physician's office regarding next steps of care.  . When you arrive at healthcare provider, tell the healthcare staff immediately you have returned from visiting [place], [place], [place], [place]  or [place]; or traveled in the [place] to [place], [place], [place], or [place]; in the last two weeks or you have been in close contact with a person diagnosed with COVID-19 in the last 2 weeks.   . Tell the health care staff about your symptoms: fever, cough and shortness of breath. . After you have been seen by a medical provider, you will be either: o Tested for (COVID-19) and discharged home on quarantine except to seek medical care if symptoms worsen, and asked to  - Stay home and avoid contact with others until you get your results (4-5 days)  - Avoid travel on public transportation if possible (such as bus, train, or airplane) or o Sent to the Emergency Department by EMS for evaluation, COVID-19 testing, and possible admission depending on your condition and test results.  What to do if you are LOW RISK for COVID-19?  Reduce your risk of any infection by using the same precautions used for avoiding the common cold or flu:  [name] Wash your hands often with soap and warm water for at least 20 seconds.  If soap and water are not readily available, use an alcohol-based hand sanitizer with at least 60% alcohol.  . If coughing or sneezing, cover your mouth and nose by coughing or sneezing into the elbow areas of your shirt or coat,  into a tissue or into your sleeve (not your hands). . Avoid shaking hands with others and consider head nods or verbal greetings only. . Avoid touching your eyes, nose, or mouth with unwashed hands.  . Avoid close contact with people who are sick. . Avoid places or events with large numbers of people in one location, like concerts or sporting events. . Carefully consider travel plans you have or are making. . If you are planning any travel outside or inside the [place], visit the CDC's Travelers' Health webpage for the latest health notices. . If you have some symptoms but not all symptoms, continue to monitor at home and seek medical attention if your  symptoms worsen. . If you are having a medical emergency, call 911.   [place] / e-Visit: [email or web address]         [facility]: [facility]: [phone]                   [facility]: [phone]

## 2019-03-12 NOTE — Progress Notes (Signed)
@Patient  ID: [name], male    DOB: 1961/11/13, 58 y.o.   MRN: [id number]  Chief Complaint  Patient presents with   Follow-up    allergies     Referring provider: Biagio Borg, MD  HPI: 58 yo male former smoker followed for allergic rhinitis and asthma    TEST/EVENTS :  Spirometry 2014 -FEV1 83%, ratio 78, FVC 97%, +BD response , DLCO nml   03/12/2019 Follow up : Allergies  Patient presents for a follow-up visit.  He complains over the last several weeks that he has had increased allergy symptoms.  Nasal congestion,postnasal drainage,  drippy nose,  Cough and tightness . Says allergies always act up in the spring and fall. He is taking Flonase but does not seem to be helping . He denies fever, discolored mucus , chest pain , dyspnea .  He has no recent travel. Known sick contact . Wife and daughter traveled to [place] last week , they are not ill and have no symptoms . Says he keeps a cough most days . He is on ACE inhibitor.   Complains that he had diarrhea this morning . No bloody stools or abdominal pain . No fever.  Ate potato soup last night . No n/v..   He is concerned because of his asthma that he is more susceptible to COVID 19 and has multiple questions. 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 We discussed  Safety  Precautions, hand washing  social distancing . [name]   Last seen 2018 .   Allergies  Allergen Reactions   Codeine Itching, Nausea Only and Rash    Immunization History  Administered Date(s) Administered   Influenza,inj,Quad PF,6+ Mos 11/08/2016, 09/07/2017   Pneumococcal Polysaccharide-23 08/01/2016   Tdap 04/16/2018    Past Medical History:  Diagnosis Date   Allergy    Asthma    bronchitis   Colon polyps    GERD (gastroesophageal reflux disease)    Hypertension    MRSA infection 09/07/2016   Pleurisy    hx-12/13   Rectal fissure    Seasonal allergies    Skin cancer     Tobacco History: Social History    Tobacco Use  Smoking Status Former Smoker   Packs/day: 0.25   Years: 4.00   Pack years: 1.00   Types: Cigarettes, Cigars   Last attempt to quit: 12/27/1984   Years since quitting: 34.2  Smokeless Tobacco Never Used  Tobacco Comment   occasional   Counseling given: Not Answered Comment: occasional   Outpatient Medications Prior to Visit  Medication Sig Dispense Refill   amLODipine (NORVASC) 10 MG tablet TAKE ONE TABLET BY MOUTH DAILY 90 tablet 1   amphetamine-dextroamphetamine (ADDERALL) 30 MG tablet Take 30 mg by mouth daily.     aspirin 81 MG EC tablet Take 1 tablet (81 mg total) by mouth daily. Swallow whole. 30 tablet 12   calcium carbonate (TUMS - DOSED IN MG ELEMENTAL CALCIUM) 500 MG chewable tablet Chew 1-2 tablets by mouth as needed for heartburn.     cetirizine (ZYRTEC) 10 MG tablet Take 10 mg by mouth daily.     fluticasone (FLONASE) 50 MCG/ACT nasal spray Place 2 sprays into both nostrils daily as needed for allergies.      glucosamine-chondroitin 500-400 MG tablet Take 1 tablet by mouth 3 (three) times daily.     HYDROcodone-acetaminophen (NORCO/VICODIN) 5-325 MG tablet Take 1 tablet by mouth every 6 (six) hours as needed for  moderate pain.     lisinopril (PRINIVIL,ZESTRIL) 20 MG tablet TAKE ONE TABLET BY MOUTH DAILY 90 tablet 1   meloxicam (MOBIC) 15 MG tablet Take 1 tablet (15 mg total) by mouth daily. 30 tablet 0   mupirocin cream (BACTROBAN) 2 % 1 application See admin instructions. Into both nostrils two times daily     pantoprazole (PROTONIX) 40 MG tablet Take 1 tablet (40 mg total) by mouth daily. 90 tablet 1   sildenafil (VIAGRA) 100 MG tablet Take 0.5-1 tablets (50-100 mg total) by mouth daily as needed for erectile dysfunction. 10 tablet 11   No facility-administered medications prior to visit.      Review of Systems:   Constitutional:   No  weight loss, night sweats,  Fevers, chills, fatigue, or  lassitude.  HEENT:   No headaches,   Difficulty swallowing,  Tooth/dental problems, or  Sore throat,                No sneezing, itching, ear ache,  +nasal congestion, post nasal drip,   CV:  No chest pain,  Orthopnea, PND, swelling in lower extremities, anasarca, dizziness, palpitations, syncope.   GI  No heartburn, indigestion, abdominal pain, nausea, vomiting,+ diarrhea, change in bowel habits,  No loss of appetite, bloody stools.   Resp:   No chest wall deformity  Skin: no rash or lesions.  GU: no dysuria, change in color of urine, no urgency or frequency.  No flank pain, no hematuria   MS:  No joint pain or swelling.  No decreased range of motion.  No back pain.    Physical Exam  BP 130/70    Pulse 97    Temp 98.6 F (37 C)    SpO2 97%   GEN: A/Ox3; pleasant , NAD, well nourished    HEENT:  [place]/AT,  EACs-clear, TMs-wnl, NOSE-clear drainage , THROAT-clear, no lesions, no postnasal drip or exudate noted.   NECK:  Supple w/ fair ROM; no JVD; normal carotid impulses w/o bruits; no thyromegaly or nodules palpated; no lymphadenopathy.    RESP  Clear  P & A; w/o, wheezes/ rales/ or rhonchi. no accessory muscle use, no dullness to percussion  CARD:  RRR, no m/r/g, no peripheral edema, pulses intact, no cyanosis or clubbing.  GI:   Soft & nt; nml bowel sounds; no organomegaly or masses detected. No guarding or rebound   Musco: Warm bil, no deformities or joint swelling noted.   Neuro: alert, no focal deficits noted.    Skin: Warm, no lesions or rashes    Lab Results:  CBC   BNP No results found for: BNP  ProBNP  Imaging: Dg Chest 2 View  Result Date: 03/12/2019 CLINICAL DATA:  Cough.  Fever. EXAM: CHEST - 2 VIEW COMPARISON:  09/07/2017 FINDINGS: Heart size is normal. Mediastinal shadows are normal. There is central bronchial thickening but no infiltrate, collapse or effusion. No significant bone finding. IMPRESSION: Possible bronchitis.  No consolidation or collapse. Electronically Signed   By: Nelson Chimes M.D.   On: 03/12/2019 16:48      No flowsheet data found.  No results found for: NITRICOXIDE      Assessment & Plan:   Mild intermittent asthma Suspect mild flare with AR  Add ICS/LABA inhaler  Chest xray today with no acute process  Control for triggers  Respiratory viral panel is neg .  COVID screening was neg (no flu, no travel, no known sick contact or high risk exposure , no new  acute symptoms ) seems to be more chronic in nature   Plan  Patient Instructions  Restart Zyrtec 10mg  At bedtime   [place] nasal rinses As needed   Flonase 2 puffs Twice daily   Begin Symbicort 2 puffs Twice daily  , rinse after use.  Albuterol inhaler 2 puffs every 4hr as needed for wheezing .  Discuss with family MD that Lisinopril may cause or aggravate your cough .  Advance bland diet as tolerated. If diarrhea does not stop will need to call family MD  Push fluids  Chest xray today .  Practice social distancing .  Follow up with Dr. Lamonte Sakai  In 4-6 weeks and As needed   Please contact office for sooner follow up if symptoms do not improve or worsen or seek emergency care          Just FYI on COVID 19 .   Coronavirus (COVID-19) Are you at risk?  Are you at risk for the Coronavirus (COVID-19)?  To be considered HIGH RISK for Coronavirus (COVID-19), you have to meet the following criteria:   Traveled to [place], [place], [place], [place] or [place]; or in the [place] to [place], [place], [place], or [place]; and have fever, cough, and shortness of breath within the last 2 weeks of travel OR  Been in close contact with a person diagnosed with COVID-19 within the last 2 weeks and have fever, cough, and shortness of breath  IF YOU DO NOT MEET THESE CRITERIA, YOU ARE CONSIDERED LOW RISK FOR COVID-19.  What to do if you are HIGH RISK for COVID-19?   If you are having a medical emergency, call 911.  Seek medical care right away. Before you go to a doctors  office, urgent care or emergency department, call ahead and tell them about your recent travel, contact with someone diagnosed with COVID-19, and your symptoms. You should receive instructions from your physicians office regarding next steps of care.   When you arrive at healthcare provider, tell the healthcare staff immediately you have returned from visiting [place], [place], [place], [place] or [place]; or traveled in the [place] to [place], [place], [place], or [place]; in the last two weeks or you have been in close contact with a person diagnosed with COVID-19 in the last 2 weeks.    Tell the health care staff about your symptoms: fever, cough and shortness of breath.  After you have been seen by a medical provider, you will be either: o Tested for (COVID-19) and discharged home on quarantine except to seek medical care if symptoms worsen, and asked to  - Stay home and avoid contact with others until you get your results (4-5 days)  - Avoid travel on public transportation if possible (such as bus, train, or airplane) or o Sent to the Emergency Department by EMS for evaluation, COVID-19 testing, and possible admission depending on your condition and test results.  What to do if you are LOW RISK for COVID-19?  Reduce your risk of any infection by using the same precautions used for avoiding the common cold or flu:   Wash your hands often with soap and warm water for at least 20 seconds.  If soap and water are not readily available, use an alcohol-based hand sanitizer with at least 60% alcohol.   If coughing or sneezing, cover your mouth and nose by coughing or sneezing into the elbow areas of your shirt or coat, into a tissue or  into your sleeve (not your hands).  Avoid shaking hands with others and consider head nods or verbal greetings only.  Avoid touching your eyes, nose, or mouth with unwashed hands.   Avoid close contact with people who are sick.  Avoid places or  events with large numbers of people in one location, like concerts or sporting events.  Carefully consider travel plans you have or are making.  If you are planning any travel outside or inside the [place], visit the [facility] webpage for the latest health notices.  If you have some symptoms but not all symptoms, continue to monitor at home and seek medical attention if your symptoms worsen.  If you are having a medical emergency, call 911.   [place] / e-Visit: [email or web address]         [facility]: [phone]  [facility]: [phone]                   [facility]: [phone]       Allergic rhinitis Flare   Plan  Patient Instructions  Restart Zyrtec 10mg  At bedtime   [place] nasal rinses As needed   Flonase 2 puffs Twice daily   Begin Symbicort 2 puffs Twice daily  , rinse after use.  Albuterol inhaler 2 puffs every 4hr as needed for wheezing .  Discuss with family MD that Lisinopril may cause or aggravate your cough .  Advance bland diet as tolerated. If diarrhea does not stop will need to call family MD  Push fluids  Chest xray today .  Practice social distancing .  Follow up with Dr. Lamonte Sakai  In 4-6 weeks and As needed   Please contact office for sooner follow up if symptoms do not improve or worsen or seek emergency care          Just FYI on COVID 19 .   Coronavirus (COVID-19) Are you at risk?  Are you at risk for the Coronavirus (COVID-19)?  To be considered HIGH RISK for Coronavirus (COVID-19), you have to meet the following criteria:   Traveled to [place], [place], [place], [place] or [place]; or in the [place] to [place], [place], [place], or [place]; and have fever, cough, and shortness of breath within the last 2 weeks of travel OR  Been in close contact with a person diagnosed with  COVID-19 within the last 2 weeks and have fever, cough, and shortness of breath  IF YOU DO NOT MEET THESE CRITERIA, YOU ARE CONSIDERED LOW RISK FOR COVID-19.  What to do if you are HIGH RISK for COVID-19?   If you are having a medical emergency, call 911.  Seek medical care right away. Before you go to a doctors office, urgent care or emergency department, call ahead and tell them about your recent travel, contact with someone diagnosed with COVID-19, and your symptoms. You should receive instructions from your physicians office regarding next steps of care.   When you arrive at healthcare provider, tell the healthcare staff immediately you have returned from visiting [place], [place], [place], [place] or [place]; or traveled in the [place] to [place], [place], [place], or [place]; in the last two weeks or you have been in close contact with a person diagnosed with COVID-19 in the last 2 weeks.    Tell the health care staff about your symptoms: fever, cough and shortness of breath.  After you have been seen by a medical provider, you will be either: o Tested for (COVID-19) and discharged home on quarantine except to seek medical care if symptoms worsen, and asked to  - Stay home and avoid contact with others until you get your results (4-5 days)  - Avoid travel on public transportation if possible (such as bus, train, or airplane) or o Sent to the Emergency Department by EMS for evaluation, COVID-19 testing, and possible admission depending on your condition and test results.  What to do if you are LOW RISK for COVID-19?  Reduce your risk of any infection by using the same precautions used for avoiding the common cold or flu:   Wash your hands often with soap and warm water for at least 20 seconds.  If soap and water are not readily available, use an alcohol-based hand sanitizer with at least 60% alcohol.   If coughing or sneezing, cover your mouth and nose by coughing or  sneezing into the elbow areas of your shirt or coat, into a tissue or into your sleeve (not your hands).  Avoid shaking hands with others and consider head nods or verbal greetings only.  Avoid touching your eyes, nose, or mouth with unwashed hands.   Avoid close contact with people who are sick.  Avoid places or events with large numbers of people in one location, like concerts or sporting events.  Carefully consider travel plans you have or are making.  If you are planning any travel outside or inside the [place], visit the [facility] webpage for the latest health notices.  If you have some symptoms but not all symptoms, continue to monitor at home and seek medical attention if your symptoms worsen.  If you are having a medical emergency, call 911.   [place] / e-Visit: [email or web address]         [facility]: [facility]: [phone]                   [facility]: [phone]    '   Cough Cough - most likely secondary to allergies  ACE inhibitor may be making worse   Plan  Patient Instructions  Restart Zyrtec 10mg  At bedtime   [place] nasal rinses As needed   Flonase 2 puffs Twice daily   Begin Symbicort 2 puffs Twice daily  , rinse after use.  Albuterol inhaler 2 puffs every 4hr as needed for wheezing .  Discuss with family MD that Lisinopril may cause or aggravate your cough .  Advance bland diet as tolerated. If diarrhea does not stop will need to call family MD  Push fluids  Chest xray today .  Practice social distancing .  Follow up with Dr. Lamonte Sakai  In 4-6 weeks and As needed   Please contact office for sooner follow up if symptoms do not improve or worsen or seek emergency care          Just FYI on COVID 19 .   Coronavirus (COVID-19) Are you at risk?  Are you at risk for the Coronavirus  (COVID-19)?  To be considered HIGH RISK for Coronavirus (COVID-19), you have to meet the following criteria:   Traveled to [place], [place], [place], [place] or [place]; or in the [place] to [place], [place], [place], or [place]; and have fever, cough, and shortness of breath within the last 2 weeks of  travel [place] in close contact with a person diagnosed with COVID-19 within the last 2 weeks and have fever, cough, and shortness of breath  IF YOU DO NOT MEET THESE CRITERIA, YOU ARE CONSIDERED LOW RISK FOR COVID-19.  What to do if you are HIGH RISK for COVID-19?   If you are having a medical emergency, call 911.  Seek medical care right away. Before you go to a doctors office, urgent care or emergency department, call ahead and tell them about your recent travel, contact with someone diagnosed with COVID-19, and your symptoms. You should receive instructions from your physicians office regarding next steps of care.   When you arrive at healthcare provider, tell the healthcare staff immediately you have returned from visiting [place], [place], [place], [place] or [place]; or traveled in the [place] to [place], [place], [place], or [place]; in the last two weeks or you have been in close contact with a person diagnosed with COVID-19 in the last 2 weeks.    Tell the health care staff about your symptoms: fever, cough and shortness of breath.  After you have been seen by a medical provider, you will be either: o Tested for (COVID-19) and discharged home on quarantine except to seek medical care if symptoms worsen, and asked to  - Stay home and avoid contact with others until you get your results (4-5 days)  - Avoid travel on public transportation if possible (such as bus, train, or airplane) or o Sent to the Emergency Department by EMS for evaluation, COVID-19 testing, and possible admission depending on your condition and test results.  What to do if you are LOW  RISK for COVID-19?  Reduce your risk of any infection by using the same precautions used for avoiding the common cold or flu:   Wash your hands often with soap and warm water for at least 20 seconds.  If soap and water are not readily available, use an alcohol-based hand sanitizer with at least 60% alcohol.   If coughing or sneezing, cover your mouth and nose by coughing or sneezing into the elbow areas of your shirt or coat, into a tissue or into your sleeve (not your hands).  Avoid shaking hands with others and consider head nods or verbal greetings only.  Avoid touching your eyes, nose, or mouth with unwashed hands.   Avoid close contact with people who are sick.  Avoid places or events with large numbers of people in one location, like concerts or sporting events.  Carefully consider travel plans you have or are making.  If you are planning any travel outside or inside the [place], visit the [place] webpage for the latest health notices.  If you have some symptoms but not all symptoms, continue to monitor at home and seek medical attention if your symptoms worsen.  If you are having a medical emergency, call 911.   [place] / e-Visit: [email or web address]         [facility]: [facility]: [phone]                   [facility]: [phone]          Rexene Edison, NP 03/12/2019

## 2019-03-13 ENCOUNTER — Telehealth: Payer: Self-pay | Admitting: Internal Medicine

## 2019-03-13 MED ORDER — TELMISARTAN 20 MG PO TABS: 20.0000 mg | ORAL_TABLET | Freq: Every day | ORAL | 3 refills | Status: DC

## 2019-03-13 NOTE — Telephone Encounter (Signed)
Ok to change to micardis 20 qd - done erx

## 2019-03-13 NOTE — Addendum Note (Signed)
Addended by: Biagio Borg on: 03/13/2019 12:43 PM   Modules accepted: Orders

## 2019-03-13 NOTE — Telephone Encounter (Signed)
Copied from [place] [id number]. Topic: Quick Communication - See Telephone Encounter >> Mar 13, 2019 10:43 AM [name] wrote: CRM for notification. See Telephone encounter for: 03/13/19.  Patient states he seen Dr Lamonte Sakai his pulmonologist yesterday and he stated that he should not be taking lisinopril (PRINIVIL,ZESTRIL) 20 MG tablet since it causes congestion and coughing. Patient would be prescribed something else in place. Please advise

## 2019-06-01 DIAGNOSIS — M79641 Pain in right hand: Secondary | ICD-10-CM | POA: Diagnosis not present

## 2019-06-01 DIAGNOSIS — M25562 Pain in left knee: Secondary | ICD-10-CM | POA: Diagnosis not present

## 2019-06-01 DIAGNOSIS — M79642 Pain in left hand: Secondary | ICD-10-CM | POA: Diagnosis not present

## 2019-06-04 DIAGNOSIS — F411 Generalized anxiety disorder: Secondary | ICD-10-CM | POA: Diagnosis not present

## 2019-06-04 DIAGNOSIS — F209 Schizophrenia, unspecified: Secondary | ICD-10-CM | POA: Diagnosis not present

## 2019-06-04 DIAGNOSIS — Z79899 Other long term (current) drug therapy: Secondary | ICD-10-CM | POA: Diagnosis not present

## 2019-06-04 DIAGNOSIS — F902 Attention-deficit hyperactivity disorder, combined type: Secondary | ICD-10-CM | POA: Diagnosis not present

## 2019-07-02 ENCOUNTER — Other Ambulatory Visit: Payer: Self-pay | Admitting: Internal Medicine

## 2019-07-03 DIAGNOSIS — D485 Neoplasm of uncertain behavior of skin: Secondary | ICD-10-CM | POA: Diagnosis not present

## 2019-07-03 DIAGNOSIS — B078 Other viral warts: Secondary | ICD-10-CM | POA: Diagnosis not present

## 2019-07-03 DIAGNOSIS — Z85828 Personal history of other malignant neoplasm of skin: Secondary | ICD-10-CM | POA: Diagnosis not present

## 2019-07-03 DIAGNOSIS — L918 Other hypertrophic disorders of the skin: Secondary | ICD-10-CM | POA: Diagnosis not present

## 2019-07-03 DIAGNOSIS — L570 Actinic keratosis: Secondary | ICD-10-CM | POA: Diagnosis not present

## 2019-07-09 DIAGNOSIS — R200 Anesthesia of skin: Secondary | ICD-10-CM | POA: Diagnosis not present

## 2019-07-13 ENCOUNTER — Other Ambulatory Visit: Payer: Self-pay

## 2019-07-13 ENCOUNTER — Encounter: Payer: Self-pay | Admitting: Internal Medicine

## 2019-07-13 ENCOUNTER — Other Ambulatory Visit (INDEPENDENT_AMBULATORY_CARE_PROVIDER_SITE_OTHER): Payer: BLUE CROSS/BLUE SHIELD

## 2019-07-13 ENCOUNTER — Ambulatory Visit (INDEPENDENT_AMBULATORY_CARE_PROVIDER_SITE_OTHER): Payer: BLUE CROSS/BLUE SHIELD | Admitting: Internal Medicine

## 2019-07-13 VITALS — BP 158/100 | HR 76 | Temp 98.5°F | Ht 69.0 in | Wt 198.0 lb

## 2019-07-13 DIAGNOSIS — I10 Essential (primary) hypertension: Secondary | ICD-10-CM

## 2019-07-13 DIAGNOSIS — E538 Deficiency of other specified B group vitamins: Secondary | ICD-10-CM | POA: Diagnosis not present

## 2019-07-13 DIAGNOSIS — E559 Vitamin D deficiency, unspecified: Secondary | ICD-10-CM

## 2019-07-13 DIAGNOSIS — E611 Iron deficiency: Secondary | ICD-10-CM

## 2019-07-13 DIAGNOSIS — Z0000 Encounter for general adult medical examination without abnormal findings: Secondary | ICD-10-CM | POA: Diagnosis not present

## 2019-07-13 DIAGNOSIS — F329 Major depressive disorder, single episode, unspecified: Secondary | ICD-10-CM | POA: Diagnosis not present

## 2019-07-13 DIAGNOSIS — F988 Other specified behavioral and emotional disorders with onset usually occurring in childhood and adolescence: Secondary | ICD-10-CM

## 2019-07-13 DIAGNOSIS — R739 Hyperglycemia, unspecified: Secondary | ICD-10-CM

## 2019-07-13 DIAGNOSIS — Z125 Encounter for screening for malignant neoplasm of prostate: Secondary | ICD-10-CM | POA: Diagnosis not present

## 2019-07-13 DIAGNOSIS — F32A Depression, unspecified: Secondary | ICD-10-CM

## 2019-07-13 DIAGNOSIS — Z0001 Encounter for general adult medical examination with abnormal findings: Secondary | ICD-10-CM

## 2019-07-13 LAB — CBC WITH DIFFERENTIAL/PLATELET
Basophils Absolute: 0 10*3/uL (ref 0.0–0.1)
Basophils Relative: 0.2 % (ref 0.0–3.0)
Eosinophils Absolute: 0.2 10*3/uL (ref 0.0–0.7)
Eosinophils Relative: 2.2 % (ref 0.0–5.0)
HCT: 44.4 % (ref 39.0–52.0)
Hemoglobin: 15 g/dL (ref 13.0–17.0)
Lymphocytes Relative: 35.5 % (ref 12.0–46.0)
Lymphs Abs: 2.6 10*3/uL (ref 0.7–4.0)
MCHC: 33.9 g/dL (ref 30.0–36.0)
MCV: 90.2 fl (ref 78.0–100.0)
Monocytes Absolute: 0.5 10*3/uL (ref 0.1–1.0)
Monocytes Relative: 7.3 % (ref 3.0–12.0)
Neutro Abs: 4 10*3/uL (ref 1.4–7.7)
Neutrophils Relative %: 54.8 % (ref 43.0–77.0)
Platelets: 291 10*3/uL (ref 150.0–400.0)
RBC: 4.92 Mil/uL (ref 4.22–5.81)
RDW: 13.5 % (ref 11.5–15.5)
WBC: 7.4 10*3/uL (ref 4.0–10.5)

## 2019-07-13 LAB — URINALYSIS, ROUTINE W REFLEX MICROSCOPIC
Bilirubin Urine: NEGATIVE
Hgb urine dipstick: NEGATIVE
Ketones, ur: NEGATIVE
Leukocytes,Ua: NEGATIVE
Nitrite: NEGATIVE
Specific Gravity, Urine: 1.015 (ref 1.000–1.030)
Total Protein, Urine: NEGATIVE
Urine Glucose: NEGATIVE
Urobilinogen, UA: 0.2 (ref 0.0–1.0)
WBC, UA: NONE SEEN — AB (ref 0–?)
pH: 7 (ref 5.0–8.0)

## 2019-07-13 LAB — BASIC METABOLIC PANEL
BUN: 18 mg/dL (ref 6–23)
CO2: 27 mEq/L (ref 19–32)
Calcium: 9.1 mg/dL (ref 8.4–10.5)
Chloride: 104 mEq/L (ref 96–112)
Creatinine, Ser: 1 mg/dL (ref 0.40–1.50)
GFR: 76.53 mL/min (ref >60.00)
Glucose, Bld: 98 mg/dL (ref 70–99)
Potassium: 4.2 mEq/L (ref 3.5–5.1)
Sodium: 138 mEq/L (ref 135–145)

## 2019-07-13 LAB — LIPID PANEL
Cholesterol: 172 mg/dL (ref 0–200)
HDL: 42 mg/dL (ref >39.00)
NonHDL: 129.91
Total CHOL/HDL Ratio: 4
Triglycerides: 266 mg/dL — ABNORMAL HIGH (ref 0.0–149.0)
VLDL: 53.2 mg/dL — ABNORMAL HIGH (ref 0.0–40.0)

## 2019-07-13 LAB — HEPATIC FUNCTION PANEL
ALT: 37 U/L (ref 0–53)
AST: 21 U/L (ref 0–37)
Albumin: 4.7 g/dL (ref 3.5–5.2)
Alkaline Phosphatase: 58 U/L (ref 39–117)
Bilirubin, Direct: 0.1 mg/dL (ref 0.0–0.3)
Total Bilirubin: 0.5 mg/dL (ref 0.2–1.2)
Total Protein: 6.9 g/dL (ref 6.0–8.3)

## 2019-07-13 LAB — PSA: PSA: 1.11 ng/mL (ref 0.10–4.00)

## 2019-07-13 LAB — IBC PANEL
Iron: 74 ug/dL (ref 42–165)
Saturation Ratios: 15.4 % — ABNORMAL LOW (ref 20.0–50.0)
Transferrin: 344 mg/dL (ref 212.0–360.0)

## 2019-07-13 LAB — LDL CHOLESTEROL, DIRECT: Direct LDL: 92 mg/dL

## 2019-07-13 LAB — HEMOGLOBIN A1C: Hgb A1c MFr Bld: 5.6 % (ref 4.6–6.5)

## 2019-07-13 LAB — TSH: TSH: 1.55 u[IU]/mL (ref 0.35–4.50)

## 2019-07-13 MED ORDER — AMPHETAMINE-DEXTROAMPHETAMINE 30 MG PO TABS: 30.0000 mg | ORAL_TABLET | Freq: Every day | ORAL | 0 refills | Status: DC

## 2019-07-13 NOTE — Patient Instructions (Addendum)
Please take all new medication as prescribed - the adderall  Please continue all other medications as before, and refills have been done if requested.  Please have the pharmacy call with any other refills you may need.  Please continue your efforts at being more active, low cholesterol diet, and weight control.  You are otherwise up to date with prevention measures today.  Please keep your appointments with your specialists as you may have planned  Please go to the LAB in the Basement (turn left off the elevator) for the tests to be done today  You will be contacted by phone if any changes need to be made immediately.  Otherwise, you will receive a letter about your results with an explanation, but please check with MyChart first.  Please remember to sign up for MyChart if you have not done so, as this will be important to you in the future with finding out test results, communicating by private email, and scheduling acute appointments online when needed.  Please return in 1 year for your yearly visit, or sooner if needed, with Lab testing done 3-5 days before
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
## 2019-07-14 ENCOUNTER — Encounter: Payer: Self-pay | Admitting: Internal Medicine

## 2019-07-14 NOTE — Assessment & Plan Note (Addendum)
Declines med tx change at this time, pt convinced high today due to stress, to f/u BP at home with goal < 140/90

## 2019-07-14 NOTE — Assessment & Plan Note (Signed)
stable overall by history and exam, recent data reviewed with pt, and pt to continue medical treatment as before,  to f/u any worsening symptoms or concerns  

## 2019-07-14 NOTE — Assessment & Plan Note (Signed)

## 2019-07-14 NOTE — Assessment & Plan Note (Signed)
Stable, for cont'd adderall asd,  to f/u any worsening symptoms or concerns 

## 2019-07-14 NOTE — Assessment & Plan Note (Addendum)
Mild situational, declines med tx or counseling referral  In addition to the time spent performing CPE, I spent an additional 25 minutes face to face,in which greater than 50% of this time was spent in counseling and coordination of care for patient's acute illness as documented, including the differential dx, treatment, further evaluation and other management of depression, ADD, HTN, hyperglycemia

## 2019-07-16 LAB — VITAMIN B12: Vitamin B-12: 473 pg/mL (ref 211–911)

## 2019-07-16 LAB — VITAMIN D 25 HYDROXY (VIT D DEFICIENCY, FRACTURES): VITD: 37.08 ng/mL (ref 30.00–100.00)

## 2019-07-18 DIAGNOSIS — G5601 Carpal tunnel syndrome, right upper limb: Secondary | ICD-10-CM | POA: Diagnosis not present

## 2019-07-18 DIAGNOSIS — G5602 Carpal tunnel syndrome, left upper limb: Secondary | ICD-10-CM | POA: Diagnosis not present

## 2019-07-20 DIAGNOSIS — G5601 Carpal tunnel syndrome, right upper limb: Secondary | ICD-10-CM | POA: Diagnosis not present

## 2019-08-01 DIAGNOSIS — M79644 Pain in right finger(s): Secondary | ICD-10-CM | POA: Diagnosis not present

## 2019-08-27 ENCOUNTER — Other Ambulatory Visit: Payer: Self-pay | Admitting: Internal Medicine

## 2019-09-04 DIAGNOSIS — F411 Generalized anxiety disorder: Secondary | ICD-10-CM | POA: Diagnosis not present

## 2019-09-04 DIAGNOSIS — F902 Attention-deficit hyperactivity disorder, combined type: Secondary | ICD-10-CM | POA: Diagnosis not present

## 2019-09-04 DIAGNOSIS — Z79899 Other long term (current) drug therapy: Secondary | ICD-10-CM | POA: Diagnosis not present

## 2019-09-04 DIAGNOSIS — G478 Other sleep disorders: Secondary | ICD-10-CM | POA: Diagnosis not present

## 2019-09-05 ENCOUNTER — Other Ambulatory Visit: Payer: Self-pay | Admitting: Internal Medicine

## 2019-09-05 DIAGNOSIS — M25552 Pain in left hip: Secondary | ICD-10-CM | POA: Diagnosis not present

## 2019-09-05 DIAGNOSIS — I10 Essential (primary) hypertension: Secondary | ICD-10-CM

## 2019-09-05 DIAGNOSIS — M25551 Pain in right hip: Secondary | ICD-10-CM | POA: Diagnosis not present

## 2019-09-05 DIAGNOSIS — M545 Low back pain: Secondary | ICD-10-CM | POA: Diagnosis not present

## 2019-09-20 DIAGNOSIS — G5602 Carpal tunnel syndrome, left upper limb: Secondary | ICD-10-CM | POA: Diagnosis not present

## 2019-09-28 ENCOUNTER — Other Ambulatory Visit: Payer: Self-pay | Admitting: Internal Medicine

## 2019-10-17 DIAGNOSIS — M25562 Pain in left knee: Secondary | ICD-10-CM | POA: Diagnosis not present

## 2019-11-09 DIAGNOSIS — K602 Anal fissure, unspecified: Secondary | ICD-10-CM | POA: Diagnosis not present

## 2019-11-09 DIAGNOSIS — R1084 Generalized abdominal pain: Secondary | ICD-10-CM | POA: Diagnosis not present

## 2019-11-09 DIAGNOSIS — K649 Unspecified hemorrhoids: Secondary | ICD-10-CM | POA: Diagnosis not present

## 2019-11-09 DIAGNOSIS — R197 Diarrhea, unspecified: Secondary | ICD-10-CM | POA: Diagnosis not present

## 2019-11-12 DIAGNOSIS — M25551 Pain in right hip: Secondary | ICD-10-CM | POA: Diagnosis not present

## 2019-11-15 DIAGNOSIS — G5601 Carpal tunnel syndrome, right upper limb: Secondary | ICD-10-CM | POA: Diagnosis not present

## 2019-11-26 DIAGNOSIS — Z9889 Other specified postprocedural states: Secondary | ICD-10-CM | POA: Diagnosis not present

## 2019-11-26 DIAGNOSIS — M5412 Radiculopathy, cervical region: Secondary | ICD-10-CM | POA: Diagnosis not present

## 2019-11-27 DIAGNOSIS — M5412 Radiculopathy, cervical region: Secondary | ICD-10-CM | POA: Diagnosis not present

## 2019-11-28 DIAGNOSIS — M5412 Radiculopathy, cervical region: Secondary | ICD-10-CM | POA: Diagnosis not present

## 2019-11-30 DIAGNOSIS — Z79899 Other long term (current) drug therapy: Secondary | ICD-10-CM | POA: Diagnosis not present

## 2019-11-30 DIAGNOSIS — G478 Other sleep disorders: Secondary | ICD-10-CM | POA: Diagnosis not present

## 2019-11-30 DIAGNOSIS — F902 Attention-deficit hyperactivity disorder, combined type: Secondary | ICD-10-CM | POA: Diagnosis not present

## 2019-11-30 DIAGNOSIS — F411 Generalized anxiety disorder: Secondary | ICD-10-CM | POA: Diagnosis not present

## 2019-12-11 DIAGNOSIS — M5412 Radiculopathy, cervical region: Secondary | ICD-10-CM | POA: Diagnosis not present

## 2019-12-12 ENCOUNTER — Other Ambulatory Visit: Payer: Self-pay | Admitting: Orthopedic Surgery

## 2019-12-12 DIAGNOSIS — M5412 Radiculopathy, cervical region: Secondary | ICD-10-CM

## 2019-12-15 DIAGNOSIS — U071 COVID-19: Secondary | ICD-10-CM | POA: Diagnosis not present

## 2019-12-19 ENCOUNTER — Ambulatory Visit: Payer: BLUE CROSS/BLUE SHIELD | Admitting: Primary Care

## 2019-12-19 ENCOUNTER — Telehealth: Payer: Self-pay | Admitting: *Deleted

## 2019-12-19 ENCOUNTER — Telehealth: Payer: Self-pay | Admitting: Emergency Medicine

## 2019-12-19 MED ORDER — ALBUTEROL SULFATE HFA 108 (90 BASE) MCG/ACT IN AERS: 2.0000 | INHALATION_SPRAY | Freq: Four times a day (QID) | RESPIRATORY_TRACT | 5 refills | Status: AC | PRN

## 2019-12-19 NOTE — Telephone Encounter (Signed)
Unfortunately, there is no specific treatment for covid unless you are sick enough to require hospn whereupon decadron, lovenox, and remdesivir (or even monoclonal antibodies) are the standard treatment  Until then we can look into symptomatic medications if needed, such as cough medicine.  Just let us know

## 2019-12-19 NOTE — Telephone Encounter (Signed)
Ok for inhaler prn but should go to ED for any onset sob  I do not prescribe ivermectin and this is not normally taken by pt with covid not needing higher level of care to my knowledge

## 2019-12-19 NOTE — Telephone Encounter (Signed)
Called and spoke with pt who stated he was diagnosed with covid 4 days ago. Pt also said he is needing meds refilled. Stated to pt that we needed to get him scheduled for a visit to further assess after diagnosis of covid and also so we can get meds refilled for him. Pt verbalized understanding and an appt was scheduled for him today at 12pm with [facility] for a televisit. Nothing further needed.

## 2019-12-19 NOTE — Telephone Encounter (Signed)
Pt informed of below. He was immediately extremely upset that it has taken [place] 2 days to contact him back about advice as to what he should do. I informed him that we never received his positive test results and Dr. Jenny Reichmann did not get his message until this morning. I apologized for the delay in returning his call from Monday, 12/17/19. He was cursing, yelling and hung up. I will not be contacting Mr. Wieser again.   He is requesting prescriptions for inhalers and ivermectin for high risk patients.

## 2019-12-19 NOTE — Telephone Encounter (Signed)
Copied from [place] [id number]. Topic: General - Inquiry >> Dec 17, 2019  2:55 PM Mathis Bud wrote: Reason for CRM: Patient had positive covid test.  Patient is requesting PCP nurse to call back to put on medication due to patient is high risk.  Call back [phone] >> Dec 19, 2019  9:02 AM Arloa Koh wrote: Pt returned call for asst for obtaining medications to help with symptoms from + results

## 2020-01-05 ENCOUNTER — Other Ambulatory Visit: Payer: BLUE CROSS/BLUE SHIELD

## 2020-01-10 DIAGNOSIS — M4722 Other spondylosis with radiculopathy, cervical region: Secondary | ICD-10-CM | POA: Diagnosis not present

## 2020-01-10 DIAGNOSIS — M50121 Cervical disc disorder at C4-C5 level with radiculopathy: Secondary | ICD-10-CM | POA: Diagnosis not present

## 2020-01-14 DIAGNOSIS — M5412 Radiculopathy, cervical region: Secondary | ICD-10-CM | POA: Diagnosis not present

## 2020-01-16 ENCOUNTER — Encounter: Payer: Self-pay | Admitting: Internal Medicine

## 2020-01-16 ENCOUNTER — Telehealth: Payer: Self-pay | Admitting: Internal Medicine

## 2020-01-16 ENCOUNTER — Other Ambulatory Visit: Payer: Self-pay | Admitting: Orthopedic Surgery

## 2020-01-16 DIAGNOSIS — E785 Hyperlipidemia, unspecified: Secondary | ICD-10-CM | POA: Insufficient documentation

## 2020-01-16 DIAGNOSIS — K635 Polyp of colon: Secondary | ICD-10-CM | POA: Insufficient documentation

## 2020-01-16 NOTE — Telephone Encounter (Signed)
See below. Completed/signed form faxed to number on form # [phone] along with 07/13/19 OV note.

## 2020-01-16 NOTE — Telephone Encounter (Signed)
Copied from [place] [id number]. Topic: General - Other >> Jan 16, 2020  4:00 PM Yvette Rack wrote: Reason for CRM: Butch Penny with [facility] stated pt is scheduled for surgery on 01/24/20 and would need to stop Coumadin 5-7 days prior to surgery. Butch Penny stated this is an urgent requests as she needs surgical clearance and requests call back. Cb# [phone]

## 2020-01-16 NOTE — Telephone Encounter (Signed)
Pt not on coumadin as per our record from July 2020  [facility] for surgury otherwise

## 2020-01-18 NOTE — Pre-Procedure Instructions (Signed)
Your procedure is scheduled on Thursday, January 28th, from 07:30 AM to 10:47 AM.  Report to [facility] Main Entrance "A" at 05:30 A.M., and check in at the Admitting office.  Call this number if you have problems the morning of surgery:  [phone]  Call [phone] if you have any questions prior to your surgery date Monday-Friday 8am-4pm.    Remember:  Do not eat after midnight the night before your surgery.  You may drink clear liquids until 04:30 AM the morning of your surgery.    Clear liquids allowed are: Water, Non-Citrus Juices (without pulp), Carbonated Beverages, Clear Tea, Black Coffee Only, and Gatorade.   Enhanced Recovery after Surgery for Orthopedics Enhanced Recovery after Surgery is a protocol used to improve the stress on your body and your recovery after surgery.  Patient Instructions  .  [name] The day of surgery (if you do NOT have diabetes):  o Drink ONE (1) Pre-Surgery Clear Ensure by 04:30 AM THE MORNING OF SURGERY. o This drink was given to you during your hospital  pre-op appointment visit.  o Nothing else to drink after completing the  Pre-Surgery Clear Ensure.     Take these medicines the morning of surgery with A SIP OF WATER : amLODipine (NORVASC) pantoprazole (PROTONIX)  fluticasone (FLONASE) - if needed albuterol (VENTOLIN HFA) 108 (90 Base) inhaler- if needed Please bring all inhalers with you the day of surgery.     As of today, STOP taking any Aspirin (unless otherwise instructed by your surgeon), Aleve, Naproxen, Ibuprofen, Motrin, Advil, Goody's, BC's, all herbal medications, fish oil, and all vitamins.    The Morning of Surgery  Do not wear jewelry.  Do not wear lotions, powders, colognes, or deodorant  Men may shave face and neck.  Do not bring valuables to the hospital.  [facility] is not responsible for any belongings or valuables.  If you are a smoker, DO NOT Smoke 24 hours prior to surgery  If you wear a CPAP at night  please bring your mask the morning of surgery   Remember that you must have someone to transport you home after your surgery, and remain with you for 24 hours if you are discharged the same day.   Please bring cases for contacts, glasses, hearing aids, dentures or bridgework because it cannot be worn into surgery.    Leave your suitcase in the car.  After surgery it may be brought to your room.  For patients admitted to the hospital, discharge time will be determined by your treatment team.  Patients discharged the day of surgery will not be allowed to drive home.    Special instructions:   [facility]- Preparing For Surgery  Before surgery, you can play an important role. Because skin is not sterile, your skin needs to be as free of germs as possible. You can reduce the number of germs on your skin by washing with CHG (chlorahexidine gluconate) Soap before surgery.  CHG is an antiseptic cleaner which kills germs and bonds with the skin to continue killing germs even after washing.    Oral Hygiene is also important to reduce your risk of infection.  Remember - BRUSH YOUR TEETH THE MORNING OF SURGERY WITH YOUR REGULAR TOOTHPASTE  Please do not use if you have an allergy to CHG or antibacterial soaps. If your skin becomes reddened/irritated stop using the CHG.  Do not shave (including legs and underarms) for at least 48 hours prior to first CHG shower.  It is OK to shave your face.  Please follow these instructions carefully.   1. Shower the NIGHT BEFORE SURGERY and the MORNING OF SURGERY with CHG Soap.   2. If you chose to wash your hair, wash your hair first as usual with your normal shampoo.  3. After you shampoo, rinse your hair and body thoroughly to remove the shampoo.  4. Use CHG as you would any other liquid soap. You can apply CHG directly to the skin and wash gently with a scrungie or a clean washcloth.   5. Apply the CHG Soap to your body ONLY FROM THE NECK DOWN.  Do not  use on open wounds or open sores. Avoid contact with your eyes, ears, mouth and genitals (private parts). Wash Face and genitals (private parts)  with your normal soap.   6. Wash thoroughly, paying special attention to the area where your surgery will be performed.  7. Thoroughly rinse your body with warm water from the neck down.  8. DO NOT shower/wash with your normal soap after using and rinsing off the CHG Soap.  9. Pat yourself dry with a CLEAN TOWEL.  10. Wear CLEAN PAJAMAS to bed the night before surgery, wear comfortable clothes the morning of surgery  11. Place CLEAN SHEETS on your bed the night of your first shower and DO NOT SLEEP WITH PETS.    Day of Surgery:  Please shower the morning of surgery with the CHG soap Do not apply any deodorants/lotions. Please wear clean clothes to the hospital/surgery center.   Remember to brush your teeth WITH YOUR REGULAR TOOTHPASTE.   Please read over the following fact sheets that you were given.

## 2020-01-21 ENCOUNTER — Encounter (HOSPITAL_COMMUNITY)
Admission: RE | Admit: 2020-01-21 | Discharge: 2020-01-21 | Disposition: A | Discharge status: Home / Self Care | Payer: BLUE CROSS/BLUE SHIELD | Source: Ambulatory Visit | Attending: Orthopedic Surgery | Admitting: Orthopedic Surgery

## 2020-01-21 ENCOUNTER — Other Ambulatory Visit: Payer: Self-pay

## 2020-01-21 ENCOUNTER — Encounter (HOSPITAL_COMMUNITY): Payer: Self-pay

## 2020-01-21 ENCOUNTER — Other Ambulatory Visit (HOSPITAL_COMMUNITY): Payer: BLUE CROSS/BLUE SHIELD

## 2020-01-21 DIAGNOSIS — Z01818 Encounter for other preprocedural examination: Secondary | ICD-10-CM | POA: Insufficient documentation

## 2020-01-21 DIAGNOSIS — I517 Cardiomegaly: Secondary | ICD-10-CM | POA: Insufficient documentation

## 2020-01-21 DIAGNOSIS — M5412 Radiculopathy, cervical region: Secondary | ICD-10-CM | POA: Diagnosis not present

## 2020-01-21 HISTORY — DX: Pneumonia, unspecified organism: J18.9

## 2020-01-21 HISTORY — DX: Cardiac arrhythmia, unspecified: I49.9

## 2020-01-21 LAB — COMPREHENSIVE METABOLIC PANEL
ALT: 36 U/L (ref 0–44)
AST: 21 U/L (ref 15–41)
Albumin: 4 g/dL (ref 3.5–5.0)
Alkaline Phosphatase: 52 U/L (ref 38–126)
Anion gap: 11 (ref 5–15)
BUN: 14 mg/dL (ref 6–20)
CO2: 23 mmol/L (ref 22–32)
Calcium: 9.3 mg/dL (ref 8.9–10.3)
Chloride: 104 mmol/L (ref 98–111)
Creatinine, Ser: 1.12 mg/dL (ref 0.61–1.24)
GFR calc Af Amer: >60 mL/min (ref >60)
GFR calc non Af Amer: >60 mL/min (ref >60)
Glucose, Bld: 99 mg/dL (ref 70–99)
Potassium: 4.2 mmol/L (ref 3.5–5.1)
Sodium: 138 mmol/L (ref 135–145)
Total Bilirubin: 0.7 mg/dL (ref 0.3–1.2)
Total Protein: 6.7 g/dL (ref 6.5–8.1)

## 2020-01-21 LAB — CBC WITH DIFFERENTIAL/PLATELET
Abs Immature Granulocytes: 0.02 10*3/uL (ref 0.00–0.07)
Basophils Absolute: 0 10*3/uL (ref 0.0–0.1)
Basophils Relative: 0 %
Eosinophils Absolute: 0.1 10*3/uL (ref 0.0–0.5)
Eosinophils Relative: 2 %
HCT: 45.7 % (ref 39.0–52.0)
Hemoglobin: 15.3 g/dL (ref 13.0–17.0)
Immature Granulocytes: 0 %
Lymphocytes Relative: 33 %
Lymphs Abs: 2.5 10*3/uL (ref 0.7–4.0)
MCH: 29.9 pg (ref 26.0–34.0)
MCHC: 33.5 g/dL (ref 30.0–36.0)
MCV: 89.3 fL (ref 80.0–100.0)
Monocytes Absolute: 0.5 10*3/uL (ref 0.1–1.0)
Monocytes Relative: 7 %
Neutro Abs: 4.3 10*3/uL (ref 1.7–7.7)
Neutrophils Relative %: 58 %
Platelets: 305 10*3/uL (ref 150–400)
RBC: 5.12 MIL/uL (ref 4.22–5.81)
RDW: 13 % (ref 11.5–15.5)
WBC: 7.4 10*3/uL (ref 4.0–10.5)
nRBC: 0 % (ref 0.0–0.2)

## 2020-01-21 LAB — URINALYSIS, ROUTINE W REFLEX MICROSCOPIC
Bilirubin Urine: NEGATIVE
Glucose, UA: NEGATIVE mg/dL
Hgb urine dipstick: NEGATIVE
Ketones, ur: NEGATIVE mg/dL
Leukocytes,Ua: NEGATIVE
Nitrite: NEGATIVE
Protein, ur: NEGATIVE mg/dL
Specific Gravity, Urine: 1.013 (ref 1.005–1.030)
pH: 7 (ref 5.0–8.0)

## 2020-01-21 LAB — ABO/RH: ABO/RH(D): AB POS

## 2020-01-21 LAB — SURGICAL PCR SCREEN
MRSA, PCR: NEGATIVE
Staphylococcus aureus: POSITIVE — AB

## 2020-01-21 LAB — APTT: aPTT: 28 seconds (ref 24–36)

## 2020-01-21 LAB — PROTIME-INR
INR: 0.9 (ref 0.8–1.2)
Prothrombin Time: 12.4 seconds (ref 11.4–15.2)

## 2020-01-21 NOTE — Progress Notes (Addendum)
PCP - Cathlean Cower, MD Cardiologist - pt denies   Chest x-ray - 03/12/19 EKG - 01/21/20 Stress Test - pt denies ECHO - pt denies Cardiac Cath - pt denies  Sleep Study - yes - per pt he does not wear CPAP    Per pt and surgeon orders, pt last dose Meloxicam was 01/17/20 Aspirin Instructions: per pt, he is not taking ASA  ERAS Protcol - yes PRE-SURGERY Ensure or G2- ensure given  COVID TEST- pt tested positive 12/15/19 - asymptomatic at PAT appointment   Anesthesia review: yes - abnormal EKG  Patient denies shortness of breath, fever, cough and chest pain at PAT appointment   All instructions explained to the patient, with a verbal understanding of the material. Patient agrees to go over the instructions while at home for a better understanding. Patient also instructed to self quarantine after being tested for COVID-19. The opportunity to ask questions was provided.

## 2020-01-22 LAB — TYPE AND SCREEN
ABO/RH(D): AB POS
Antibody Screen: NEGATIVE

## 2020-01-22 NOTE — Anesthesia Preprocedure Evaluation (Addendum)
Anesthesia Evaluation  Patient identified by MRN, date of birth, ID band Patient awake    Reviewed: Allergy & Precautions, NPO status , Patient's Chart, lab work & pertinent test results  History of Anesthesia Complications Negative for: history of anesthetic complications  Airway Mallampati: II  TM Distance: >3 FB Neck ROM: Full    Dental  (+) Dental Advisory Given   Pulmonary COPD,  COPD inhaler, former smoker,  12/14/2020 POCT CoVid-19 nucleic acid  Your covid test was positive    breath sounds clear to auscultation       Cardiovascular hypertension, Pt. on medications (-) angina Rhythm:Regular Rate:Normal     Neuro/Psych Anxiety Depression Chronic back pain: narcotics    GI/Hepatic Neg liver ROS, GERD  Medicated and Poorly Controlled,  Endo/Other  negative endocrine ROS  Renal/GU negative Renal ROS     Musculoskeletal  (+) Arthritis ,   Abdominal   Peds  Hematology negative hematology ROS (+)   Anesthesia Other Findings   Reproductive/Obstetrics                           Anesthesia Physical Anesthesia Plan  ASA: III  Anesthesia Plan: General   Post-op Pain Management:    Induction: Intravenous  PONV Risk Score and Plan: 3 and Scopolamine patch - Pre-op, Dexamethasone and Ondansetron  Airway Management Planned: Oral ETT  Additional Equipment:   Intra-op Plan:   Post-operative Plan: Extubation in OR  Informed Consent: I have reviewed the patients History and Physical, chart, labs and discussed the procedure including the risks, benefits and alternatives for the proposed anesthesia with the patient or authorized representative who has indicated his/her understanding and acceptance.     Dental advisory given  Plan Discussed with: CRNA and Surgeon  Anesthesia Plan Comments: (Tested positive for Covid 12/15/19 at [facility] urgent care. Note and test result in care  everywhere. Pt was treated outpatient and recovered. Per note in Epic1/20/20, pt cleared for surgery by PCP Dr. Jenny Reichmann.  Preop labs reviewed, WNL.)       Anesthesia Quick Evaluation

## 2020-01-24 ENCOUNTER — Ambulatory Visit (HOSPITAL_COMMUNITY): Payer: BLUE CROSS/BLUE SHIELD | Admitting: Anesthesiology

## 2020-01-24 ENCOUNTER — Ambulatory Visit: Payer: Self-pay

## 2020-01-24 ENCOUNTER — Other Ambulatory Visit: Payer: Self-pay

## 2020-01-24 ENCOUNTER — Encounter (HOSPITAL_COMMUNITY): Payer: Self-pay | Admitting: Orthopedic Surgery

## 2020-01-24 ENCOUNTER — Ambulatory Visit (HOSPITAL_COMMUNITY): Payer: BLUE CROSS/BLUE SHIELD | Admitting: Vascular Surgery

## 2020-01-24 ENCOUNTER — Ambulatory Visit (HOSPITAL_COMMUNITY)
Admission: RE | Disposition: A | Discharge status: Home / Self Care | Payer: Self-pay | Source: Home / Self Care | Attending: Orthopedic Surgery

## 2020-01-24 ENCOUNTER — Observation Stay (HOSPITAL_COMMUNITY)
Admission: RE | Admit: 2020-01-24 | Discharge: 2020-01-25 | Disposition: A | Discharge status: Home / Self Care | Payer: BLUE CROSS/BLUE SHIELD | Attending: Orthopedic Surgery | Admitting: Orthopedic Surgery

## 2020-01-24 ENCOUNTER — Ambulatory Visit (HOSPITAL_COMMUNITY): Payer: BLUE CROSS/BLUE SHIELD

## 2020-01-24 DIAGNOSIS — G549 Nerve root and plexus disorder, unspecified: Secondary | ICD-10-CM | POA: Diagnosis not present

## 2020-01-24 DIAGNOSIS — M5412 Radiculopathy, cervical region: Secondary | ICD-10-CM | POA: Diagnosis not present

## 2020-01-24 DIAGNOSIS — G8929 Other chronic pain: Secondary | ICD-10-CM | POA: Insufficient documentation

## 2020-01-24 DIAGNOSIS — Z885 Allergy status to narcotic agent status: Secondary | ICD-10-CM | POA: Diagnosis not present

## 2020-01-24 DIAGNOSIS — Z888 Allergy status to other drugs, medicaments and biological substances status: Secondary | ICD-10-CM | POA: Diagnosis not present

## 2020-01-24 DIAGNOSIS — Z7982 Long term (current) use of aspirin: Secondary | ICD-10-CM | POA: Diagnosis not present

## 2020-01-24 DIAGNOSIS — Z79899 Other long term (current) drug therapy: Secondary | ICD-10-CM | POA: Insufficient documentation

## 2020-01-24 DIAGNOSIS — Z419 Encounter for procedure for purposes other than remedying health state, unspecified: Secondary | ICD-10-CM

## 2020-01-24 DIAGNOSIS — M1990 Unspecified osteoarthritis, unspecified site: Secondary | ICD-10-CM | POA: Diagnosis not present

## 2020-01-24 DIAGNOSIS — M4802 Spinal stenosis, cervical region: Secondary | ICD-10-CM | POA: Insufficient documentation

## 2020-01-24 DIAGNOSIS — I10 Essential (primary) hypertension: Secondary | ICD-10-CM | POA: Insufficient documentation

## 2020-01-24 DIAGNOSIS — Z87891 Personal history of nicotine dependence: Secondary | ICD-10-CM | POA: Diagnosis not present

## 2020-01-24 DIAGNOSIS — J449 Chronic obstructive pulmonary disease, unspecified: Secondary | ICD-10-CM | POA: Diagnosis not present

## 2020-01-24 DIAGNOSIS — F988 Other specified behavioral and emotional disorders with onset usually occurring in childhood and adolescence: Secondary | ICD-10-CM | POA: Diagnosis not present

## 2020-01-24 DIAGNOSIS — Z85820 Personal history of malignant melanoma of skin: Secondary | ICD-10-CM | POA: Diagnosis not present

## 2020-01-24 DIAGNOSIS — M549 Dorsalgia, unspecified: Secondary | ICD-10-CM | POA: Insufficient documentation

## 2020-01-24 DIAGNOSIS — Z8616 Personal history of COVID-19: Secondary | ICD-10-CM | POA: Insufficient documentation

## 2020-01-24 DIAGNOSIS — Z8261 Family history of arthritis: Secondary | ICD-10-CM | POA: Diagnosis not present

## 2020-01-24 DIAGNOSIS — G9520 Unspecified cord compression: Secondary | ICD-10-CM | POA: Diagnosis not present

## 2020-01-24 DIAGNOSIS — K219 Gastro-esophageal reflux disease without esophagitis: Secondary | ICD-10-CM | POA: Diagnosis not present

## 2020-01-24 DIAGNOSIS — M5410 Radiculopathy, site unspecified: Secondary | ICD-10-CM | POA: Diagnosis present

## 2020-01-24 DIAGNOSIS — Z8249 Family history of ischemic heart disease and other diseases of the circulatory system: Secondary | ICD-10-CM | POA: Diagnosis not present

## 2020-01-24 HISTORY — PX: ANTERIOR CERVICAL DECOMP/DISCECTOMY FUSION: SHX1161

## 2020-01-24 SURGERY — ANTERIOR CERVICAL DECOMPRESSION/DISCECTOMY FUSION 3 LEVELS
Anesthesia: General

## 2020-01-24 MED ORDER — SODIUM CHLORIDE 0.9 % IV SOLN: 250.0000 mL | INTRAVENOUS | Status: DC

## 2020-01-24 MED ORDER — HYDROMORPHONE HCL 1 MG/ML IJ SOLN
INTRAMUSCULAR | Status: AC
  Filled 2020-01-24: qty 1

## 2020-01-24 MED ORDER — ONDANSETRON HCL 4 MG/2ML IJ SOLN
INTRAMUSCULAR | Status: DC | PRN
  Administered 2020-01-24: 4 mg via INTRAVENOUS

## 2020-01-24 MED ORDER — SODIUM CHLORIDE 0.9% FLUSH
3.0000 mL | Freq: Two times a day (BID) | INTRAVENOUS | Status: DC
  Administered 2020-01-24: 22:00:00 3 mL via INTRAVENOUS

## 2020-01-24 MED ORDER — HYDROMORPHONE HCL 1 MG/ML IJ SOLN
0.2500 mg | INTRAMUSCULAR | Status: DC | PRN
  Administered 2020-01-24: 12:00:00 0.5 mg via INTRAVENOUS
  Administered 2020-01-24: 11:00:00 0.25 mg via INTRAVENOUS
  Administered 2020-01-24: 12:00:00 0.5 mg via INTRAVENOUS
  Administered 2020-01-24: 12:00:00 0.25 mg via INTRAVENOUS
  Administered 2020-01-24: 12:00:00 0.5 mg via INTRAVENOUS

## 2020-01-24 MED ORDER — ACETAMINOPHEN 10 MG/ML IV SOLN
INTRAVENOUS | Status: AC
  Filled 2020-01-24: qty 100

## 2020-01-24 MED ORDER — THROMBIN (RECOMBINANT) 20000 UNITS EX SOLR
CUTANEOUS | Status: AC
  Filled 2020-01-24: qty 20000

## 2020-01-24 MED ORDER — ALUM & MAG HYDROXIDE-SIMETH 200-200-20 MG/5ML PO SUSP: 30.0000 mL | Freq: Four times a day (QID) | ORAL | Status: DC | PRN

## 2020-01-24 MED ORDER — MIDAZOLAM HCL 2 MG/2ML IJ SOLN: 0.5000 mg | Freq: Once | INTRAMUSCULAR | Status: DC | PRN

## 2020-01-24 MED ORDER — METHOCARBAMOL 500 MG PO TABS
500.0000 mg | ORAL_TABLET | Freq: Four times a day (QID) | ORAL | Status: DC | PRN
  Administered 2020-01-24 – 2020-01-25 (×3): 500 mg via ORAL
  Filled 2020-01-24 (×3): qty 1

## 2020-01-24 MED ORDER — PROPOFOL 10 MG/ML IV BOLUS
INTRAVENOUS | Status: DC | PRN
  Administered 2020-01-24: 200 mg via INTRAVENOUS
  Administered 2020-01-24: 100 mg via INTRAVENOUS

## 2020-01-24 MED ORDER — FENTANYL CITRATE (PF) 250 MCG/5ML IJ SOLN
INTRAMUSCULAR | Status: DC | PRN
  Administered 2020-01-24: 100 ug via INTRAVENOUS
  Administered 2020-01-24: 50 ug via INTRAVENOUS
  Administered 2020-01-24: 100 ug via INTRAVENOUS
  Administered 2020-01-24: 150 ug via INTRAVENOUS

## 2020-01-24 MED ORDER — LIDOCAINE 2% (20 MG/ML) 5 ML SYRINGE
INTRAMUSCULAR | Status: AC
  Filled 2020-01-24: qty 5

## 2020-01-24 MED ORDER — PHENOL 1.4 % MT LIQD
1.0000 | OROMUCOSAL | Status: DC | PRN
  Filled 2020-01-24: qty 177

## 2020-01-24 MED ORDER — CEFAZOLIN SODIUM-DEXTROSE 2-4 GM/100ML-% IV SOLN
2.0000 g | Freq: Three times a day (TID) | INTRAVENOUS | Status: AC
  Administered 2020-01-24 (×2): 2 g via INTRAVENOUS
  Filled 2020-01-24 (×2): qty 100

## 2020-01-24 MED ORDER — SUGAMMADEX SODIUM 200 MG/2ML IV SOLN
INTRAVENOUS | Status: DC | PRN
  Administered 2020-01-24 (×2): 100 mg via INTRAVENOUS

## 2020-01-24 MED ORDER — LIDOCAINE 2% (20 MG/ML) 5 ML SYRINGE
INTRAMUSCULAR | Status: DC | PRN
  Administered 2020-01-24: 40 mg via INTRAVENOUS

## 2020-01-24 MED ORDER — PHENYLEPHRINE 40 MCG/ML (10ML) SYRINGE FOR IV PUSH (FOR BLOOD PRESSURE SUPPORT)
PREFILLED_SYRINGE | INTRAVENOUS | Status: DC | PRN
  Administered 2020-01-24: 120 ug via INTRAVENOUS
  Administered 2020-01-24 (×2): 80 ug via INTRAVENOUS

## 2020-01-24 MED ORDER — DEXAMETHASONE SODIUM PHOSPHATE 10 MG/ML IJ SOLN
INTRAMUSCULAR | Status: DC | PRN
  Administered 2020-01-24: 10 mg via INTRAVENOUS

## 2020-01-24 MED ORDER — ONDANSETRON HCL 4 MG/2ML IJ SOLN
INTRAMUSCULAR | Status: AC
  Filled 2020-01-24: qty 2

## 2020-01-24 MED ORDER — DOCUSATE SODIUM 100 MG PO CAPS
100.0000 mg | ORAL_CAPSULE | Freq: Two times a day (BID) | ORAL | Status: DC
  Administered 2020-01-24 – 2020-01-25 (×3): 100 mg via ORAL
  Filled 2020-01-24 (×3): qty 1

## 2020-01-24 MED ORDER — ONDANSETRON HCL 4 MG PO TABS: 4.0000 mg | ORAL_TABLET | Freq: Four times a day (QID) | ORAL | Status: DC | PRN

## 2020-01-24 MED ORDER — ROCURONIUM BROMIDE 10 MG/ML (PF) SYRINGE
PREFILLED_SYRINGE | INTRAVENOUS | Status: DC | PRN
  Administered 2020-01-24: 70 mg via INTRAVENOUS
  Administered 2020-01-24: 30 mg via INTRAVENOUS
  Administered 2020-01-24: 50 mg via INTRAVENOUS
  Administered 2020-01-24: 30 mg via INTRAVENOUS

## 2020-01-24 MED ORDER — LACTATED RINGERS IV SOLN: INTRAVENOUS | Status: DC | PRN

## 2020-01-24 MED ORDER — FENTANYL CITRATE (PF) 250 MCG/5ML IJ SOLN
INTRAMUSCULAR | Status: AC
  Filled 2020-01-24: qty 5

## 2020-01-24 MED ORDER — ROCURONIUM BROMIDE 10 MG/ML (PF) SYRINGE
PREFILLED_SYRINGE | INTRAVENOUS | Status: AC
  Filled 2020-01-24: qty 10

## 2020-01-24 MED ORDER — EPINEPHRINE PF 1 MG/ML IJ SOLN
INTRAMUSCULAR | Status: AC
  Filled 2020-01-24: qty 1

## 2020-01-24 MED ORDER — FLUTICASONE PROPIONATE 50 MCG/ACT NA SUSP
2.0000 | Freq: Every day | NASAL | Status: DC | PRN
  Filled 2020-01-24: qty 16

## 2020-01-24 MED ORDER — GLUCOSAMINE-CHONDROITIN 500-400 MG PO TABS: 1.0000 | ORAL_TABLET | Freq: Every day | ORAL | Status: DC

## 2020-01-24 MED ORDER — MIDAZOLAM HCL 2 MG/2ML IJ SOLN
INTRAMUSCULAR | Status: AC
  Filled 2020-01-24: qty 2

## 2020-01-24 MED ORDER — ONDANSETRON HCL 4 MG/2ML IJ SOLN: 4.0000 mg | Freq: Four times a day (QID) | INTRAMUSCULAR | Status: DC | PRN

## 2020-01-24 MED ORDER — BUPIVACAINE HCL (PF) 0.25 % IJ SOLN
INTRAMUSCULAR | Status: AC
  Filled 2020-01-24: qty 30

## 2020-01-24 MED ORDER — 0.9 % SODIUM CHLORIDE (POUR BTL) OPTIME
TOPICAL | Status: DC | PRN
  Administered 2020-01-24 (×2): 1000 mL

## 2020-01-24 MED ORDER — BISACODYL 5 MG PO TBEC: 5.0000 mg | DELAYED_RELEASE_TABLET | Freq: Every day | ORAL | Status: DC | PRN

## 2020-01-24 MED ORDER — ALBUTEROL SULFATE (2.5 MG/3ML) 0.083% IN NEBU: 3.0000 mL | INHALATION_SOLUTION | Freq: Four times a day (QID) | RESPIRATORY_TRACT | Status: DC | PRN

## 2020-01-24 MED ORDER — MENTHOL 3 MG MT LOZG: 1.0000 | LOZENGE | OROMUCOSAL | Status: DC | PRN

## 2020-01-24 MED ORDER — DEXAMETHASONE SODIUM PHOSPHATE 10 MG/ML IJ SOLN
INTRAMUSCULAR | Status: AC
  Filled 2020-01-24: qty 1

## 2020-01-24 MED ORDER — IRBESARTAN 75 MG PO TABS
75.0000 mg | ORAL_TABLET | Freq: Every day | ORAL | Status: DC
  Administered 2020-01-25: 75 mg via ORAL
  Filled 2020-01-24: qty 1

## 2020-01-24 MED ORDER — ZOLPIDEM TARTRATE 5 MG PO TABS
5.0000 mg | ORAL_TABLET | Freq: Every evening | ORAL | Status: DC | PRN
  Administered 2020-01-24: 5 mg via ORAL
  Filled 2020-01-24: qty 1

## 2020-01-24 MED ORDER — MIDAZOLAM HCL 5 MG/5ML IJ SOLN
INTRAMUSCULAR | Status: DC | PRN
  Administered 2020-01-24: 2 mg via INTRAVENOUS

## 2020-01-24 MED ORDER — AMLODIPINE BESYLATE 5 MG PO TABS
10.0000 mg | ORAL_TABLET | Freq: Every day | ORAL | Status: DC
  Administered 2020-01-25: 08:00:00 10 mg via ORAL
  Filled 2020-01-24: qty 2

## 2020-01-24 MED ORDER — PANTOPRAZOLE SODIUM 40 MG IV SOLR: 40.0000 mg | Freq: Every day | INTRAVENOUS | Status: DC

## 2020-01-24 MED ORDER — PROMETHAZINE HCL 25 MG/ML IJ SOLN: 6.2500 mg | INTRAMUSCULAR | Status: DC | PRN

## 2020-01-24 MED ORDER — ADULT MULTIVITAMIN W/MINERALS CH
1.0000 | ORAL_TABLET | Freq: Every day | ORAL | Status: DC
  Administered 2020-01-25: 1 via ORAL
  Filled 2020-01-24: qty 1

## 2020-01-24 MED ORDER — FLEET ENEMA 7-19 GM/118ML RE ENEM: 1.0000 | ENEMA | Freq: Once | RECTAL | Status: DC | PRN

## 2020-01-24 MED ORDER — HEMOSTATIC AGENTS (NO CHARGE) OPTIME
TOPICAL | Status: DC | PRN
  Administered 2020-01-24: 1 via TOPICAL

## 2020-01-24 MED ORDER — PROPOFOL 10 MG/ML IV BOLUS
INTRAVENOUS | Status: AC
  Filled 2020-01-24: qty 20

## 2020-01-24 MED ORDER — PHENYLEPHRINE HCL-NACL 10-0.9 MG/250ML-% IV SOLN
INTRAVENOUS | Status: DC | PRN
  Administered 2020-01-24: 30 ug/min via INTRAVENOUS

## 2020-01-24 MED ORDER — SUCCINYLCHOLINE CHLORIDE 200 MG/10ML IV SOSY
PREFILLED_SYRINGE | INTRAVENOUS | Status: AC
  Filled 2020-01-24: qty 10

## 2020-01-24 MED ORDER — ACETAMINOPHEN 10 MG/ML IV SOLN
INTRAVENOUS | Status: DC | PRN
  Administered 2020-01-24: 1000 mg via INTRAVENOUS

## 2020-01-24 MED ORDER — ACETAMINOPHEN 650 MG RE SUPP: 650.0000 mg | RECTAL | Status: DC | PRN

## 2020-01-24 MED ORDER — POVIDONE-IODINE 7.5 % EX SOLN: Freq: Once | CUTANEOUS | Status: AC

## 2020-01-24 MED ORDER — PANTOPRAZOLE SODIUM 40 MG PO TBEC
40.0000 mg | DELAYED_RELEASE_TABLET | Freq: Every day | ORAL | Status: DC
  Administered 2020-01-25: 08:00:00 40 mg via ORAL
  Filled 2020-01-24: qty 1

## 2020-01-24 MED ORDER — SENNOSIDES-DOCUSATE SODIUM 8.6-50 MG PO TABS: 1.0000 | ORAL_TABLET | Freq: Every evening | ORAL | Status: DC | PRN

## 2020-01-24 MED ORDER — HYDROMORPHONE HCL 1 MG/ML IJ SOLN
1.0000 mg | INTRAMUSCULAR | Status: DC | PRN
  Administered 2020-01-24 (×3): 1 mg via INTRAVENOUS
  Filled 2020-01-24 (×3): qty 1

## 2020-01-24 MED ORDER — OXYCODONE-ACETAMINOPHEN 5-325 MG PO TABS
1.0000 | ORAL_TABLET | ORAL | Status: DC | PRN
  Administered 2020-01-24 – 2020-01-25 (×5): 2 via ORAL
  Filled 2020-01-24 (×5): qty 2

## 2020-01-24 MED ORDER — THROMBIN 20000 UNITS EX SOLR
CUTANEOUS | Status: DC | PRN
  Administered 2020-01-24: 20000 [IU] via TOPICAL

## 2020-01-24 MED ORDER — EPINEPHRINE PF 1 MG/ML IJ SOLN
INTRAMUSCULAR | Status: DC | PRN
  Administered 2020-01-24: .15 mL

## 2020-01-24 MED ORDER — PHENYLEPHRINE 40 MCG/ML (10ML) SYRINGE FOR IV PUSH (FOR BLOOD PRESSURE SUPPORT)
PREFILLED_SYRINGE | INTRAVENOUS | Status: AC
  Filled 2020-01-24: qty 10

## 2020-01-24 MED ORDER — SODIUM CHLORIDE 0.9% FLUSH: 3.0000 mL | INTRAVENOUS | Status: DC | PRN

## 2020-01-24 MED ORDER — METHOCARBAMOL 1000 MG/10ML IJ SOLN
500.0000 mg | Freq: Four times a day (QID) | INTRAVENOUS | Status: DC | PRN
  Filled 2020-01-24: qty 5

## 2020-01-24 MED ORDER — ACETAMINOPHEN 325 MG PO TABS: 650.0000 mg | ORAL_TABLET | ORAL | Status: DC | PRN

## 2020-01-24 MED ORDER — CEFAZOLIN SODIUM-DEXTROSE 2-4 GM/100ML-% IV SOLN
2.0000 g | INTRAVENOUS | Status: AC
  Administered 2020-01-24: 08:00:00 2 g via INTRAVENOUS
  Filled 2020-01-24: qty 100

## 2020-01-24 MED ORDER — BUPIVACAINE HCL (PF) 0.25 % IJ SOLN
INTRAMUSCULAR | Status: DC | PRN
  Administered 2020-01-24: 20 mL

## 2020-01-24 MED ORDER — MEPERIDINE HCL 25 MG/ML IJ SOLN: 6.2500 mg | INTRAMUSCULAR | Status: DC | PRN

## 2020-01-24 SURGICAL SUPPLY — 79 items
AGENT HMST KT MTR STRL THRMB (HEMOSTASIS)
APL SKNCLS STERI-STRIP NONHPOA (GAUZE/BANDAGES/DRESSINGS) ×1
BENZOIN TINCTURE PRP APPL 2/3 (GAUZE/BANDAGES/DRESSINGS) ×2 IMPLANT
BIT DRILL NEURO 2X3.1 SFT TUCH (MISCELLANEOUS) ×1 IMPLANT
BIT DRILL SRG 14X2.2XFLT CHK (BIT) ×1 IMPLANT
BIT DRL SRG 14X2.2XFLT CHK (BIT) ×1
BLADE CLIPPER SURG (BLADE) ×2 IMPLANT
BLADE SURG 15 STRL LF DISP TIS (BLADE) ×1 IMPLANT
BLADE SURG 15 STRL SS (BLADE) ×2
BONE VIVIGEN FORMABLE 1.3CC (Bone Implant) ×4 IMPLANT
BUR MATCHSTICK NEURO 3.0 LAGG (BURR) IMPLANT
CARTRIDGE OIL MAESTRO DRILL (MISCELLANEOUS) ×1 IMPLANT
CLSR STERI-STRIP ANTIMIC 1/2X4 (GAUZE/BANDAGES/DRESSINGS) ×1 IMPLANT
COVER SURGICAL LIGHT HANDLE (MISCELLANEOUS) ×2 IMPLANT
COVER WAND RF STERILE (DRAPES) ×2 IMPLANT
DECANTER SPIKE VIAL GLASS SM (MISCELLANEOUS) ×2 IMPLANT
DEVICE ENDSKLTN MED 6 7MM (Orthopedic Implant) IMPLANT
DIFFUSER DRILL AIR PNEUMATIC (MISCELLANEOUS) ×2 IMPLANT
DRAIN JACKSON RD 7FR 3/32 (WOUND CARE) IMPLANT
DRAPE C-ARM 42X72 X-RAY (DRAPES) ×2 IMPLANT
DRAPE POUCH INSTRU U-SHP 10X18 (DRAPES) ×2 IMPLANT
DRAPE SURG 17X23 STRL (DRAPES) ×6 IMPLANT
DRILL BIT SKYLINE 14MM (BIT) ×2
DRILL NEURO 2X3.1 SOFT TOUCH (MISCELLANEOUS) ×2
DURAPREP 26ML APPLICATOR (WOUND CARE) ×2 IMPLANT
ELECT COATED BLADE 2.86 ST (ELECTRODE) ×2 IMPLANT
ELECT REM PT RETURN 9FT ADLT (ELECTROSURGICAL) ×2
ELECTRODE REM PT RTRN 9FT ADLT (ELECTROSURGICAL) ×1 IMPLANT
ENDOSKELETON MED 6 7MM (Orthopedic Implant) ×2 IMPLANT
EVACUATOR SILICONE 100CC (DRAIN) IMPLANT
GAUZE 4X4 16PLY RFD (DISPOSABLE) ×2 IMPLANT
GAUZE SPONGE 4X4 12PLY STRL (GAUZE/BANDAGES/DRESSINGS) ×2 IMPLANT
GAUZE SPONGE 4X4 16PLY XRAY LF (GAUZE/BANDAGES/DRESSINGS) ×2 IMPLANT
GLOVE BIO SURGEON STRL SZ7 (GLOVE) ×2 IMPLANT
GLOVE BIO SURGEON STRL SZ8 (GLOVE) ×2 IMPLANT
GLOVE BIOGEL PI IND STRL 7.0 (GLOVE) ×2 IMPLANT
GLOVE BIOGEL PI IND STRL 8 (GLOVE) ×1 IMPLANT
GLOVE BIOGEL PI INDICATOR 7.0 (GLOVE) ×2
GLOVE BIOGEL PI INDICATOR 8 (GLOVE) ×1
GOWN STRL REUS W/ TWL LRG LVL3 (GOWN DISPOSABLE) ×1 IMPLANT
GOWN STRL REUS W/ TWL XL LVL3 (GOWN DISPOSABLE) ×1 IMPLANT
GOWN STRL REUS W/TWL LRG LVL3 (GOWN DISPOSABLE) ×2
GOWN STRL REUS W/TWL XL LVL3 (GOWN DISPOSABLE) ×2
GRAFT BNE MATRIX VG FRMBL SM 1 (Bone Implant) IMPLANT
INTERLOCK LRDTC CRVCL VBR 6MM (Bone Implant) IMPLANT
IV CATH 14GX2 1/4 (CATHETERS) ×2 IMPLANT
KIT BASIN OR (CUSTOM PROCEDURE TRAY) ×2 IMPLANT
KIT TURNOVER KIT B (KITS) ×2 IMPLANT
LORDOTIC CERVICAL VBR 6MM SM (Bone Implant) ×4 IMPLANT
MANIFOLD NEPTUNE II (INSTRUMENTS) ×2 IMPLANT
NDL PRECISIONGLIDE 27X1.5 (NEEDLE) ×1 IMPLANT
NEEDLE PRECISIONGLIDE 27X1.5 (NEEDLE) ×2 IMPLANT
NEEDLE SPNL 20GX3.5 QUINCKE YW (NEEDLE) ×2 IMPLANT
NS IRRIG 1000ML POUR BTL (IV SOLUTION) ×2 IMPLANT
OIL CARTRIDGE MAESTRO DRILL (MISCELLANEOUS) ×2
PACK ORTHO CERVICAL (CUSTOM PROCEDURE TRAY) ×2 IMPLANT
PAD ARMBOARD 7.5X6 YLW CONV (MISCELLANEOUS) ×4 IMPLANT
PATTIES SURGICAL .5 X.5 (GAUZE/BANDAGES/DRESSINGS) IMPLANT
PATTIES SURGICAL .5 X1 (DISPOSABLE) IMPLANT
PIN DISTRACTION 14 (PIN) ×4 IMPLANT
PLATE SKYLINE 3 LVL 48MM (Plate) ×1 IMPLANT
POSITIONER HEAD DONUT 9IN (MISCELLANEOUS) ×2 IMPLANT
SCREW SKYLINE VAR OS 14MM (Screw) ×8 IMPLANT
SPONGE INTESTINAL PEANUT (DISPOSABLE) ×4 IMPLANT
SPONGE SURGIFOAM ABS GEL 100 (HEMOSTASIS) ×2 IMPLANT
STRIP CLOSURE SKIN 1/2X4 (GAUZE/BANDAGES/DRESSINGS) ×2 IMPLANT
SURGIFLO W/THROMBIN 8M KIT (HEMOSTASIS) IMPLANT
SUT MNCRL AB 4-0 PS2 18 (SUTURE) ×2 IMPLANT
SUT VIC AB 2-0 CT2 18 VCP726D (SUTURE) ×2 IMPLANT
SYR BULB IRRIGATION 50ML (SYRINGE) ×2 IMPLANT
SYR CONTROL 10ML LL (SYRINGE) ×4 IMPLANT
TAPE CLOTH 4X10 WHT NS (GAUZE/BANDAGES/DRESSINGS) ×2 IMPLANT
TAPE CLOTH SURG 4X10 WHT LF (GAUZE/BANDAGES/DRESSINGS) ×1 IMPLANT
TAPE UMBILICAL COTTON 1/8X30 (MISCELLANEOUS) ×2 IMPLANT
TOWEL GREEN STERILE (TOWEL DISPOSABLE) ×2 IMPLANT
TOWEL GREEN STERILE FF (TOWEL DISPOSABLE) ×2 IMPLANT
TRAY FOLEY MTR SLVR 16FR STAT (SET/KITS/TRAYS/PACK) ×2 IMPLANT
WATER STERILE IRR 1000ML POUR (IV SOLUTION) ×2 IMPLANT
YANKAUER SUCT BULB TIP NO VENT (SUCTIONS) ×2 IMPLANT

## 2020-01-24 NOTE — Anesthesia Postprocedure Evaluation (Signed)
Anesthesia Post Note  Patient: [name]  Procedure(s) Performed: ANTERIOR CERVICAL DECOMPRESSION FUSION, CERVICAL 4-5, CERVICAL 5-6, CERVIXAL 6-7 (N/A )     Patient location during evaluation: PACU Anesthesia Type: General Level of consciousness: awake and alert, patient cooperative and oriented Pain management: pain level controlled Vital Signs Assessment: post-procedure vital signs reviewed and stable Respiratory status: spontaneous breathing, nonlabored ventilation and respiratory function stable Cardiovascular status: blood pressure returned to baseline and stable Postop Assessment: no apparent nausea or vomiting Anesthetic complications: no    Last Vitals:  Vitals:   01/24/20 1151 01/24/20 1200  BP: (!) 160/87   Pulse: 95 88  Resp: 15 18  Temp:    SpO2: 95% 95%    Last Pain:  Vitals:   01/24/20 1210  PainSc: Asleep                 Oval Moralez,E. [place]ia Mincey

## 2020-01-24 NOTE — Anesthesia Procedure Notes (Signed)
Procedure Name: Intubation Date/Time: 01/24/2020 7:48 AM Performed by: Trinna Post., CRNA Pre-anesthesia Checklist: Patient identified, Emergency Drugs available, Suction available, Patient being monitored and Timeout performed Patient Re-evaluated:Patient Re-evaluated prior to induction Oxygen Delivery Method: Circle system utilized Preoxygenation: Pre-oxygenation with 100% oxygen Induction Type: IV induction Ventilation: Mask ventilation without difficulty and Oral airway inserted - appropriate to patient size Laryngoscope Size: Mac and 4 Grade View: Grade I Tube type: Oral Tube size: 7.5 mm Number of attempts: 1 Airway Equipment and Method: Stylet Placement Confirmation: ETT inserted through vocal cords under direct vision,  positive ETCO2 and breath sounds checked- equal and bilateral Secured at: 23 cm Tube secured with: Tape Dental Injury: Teeth and Oropharynx as per pre-operative assessment

## 2020-01-24 NOTE — Progress Notes (Signed)
Orthopedic Tech Progress Note Patient Details:  [name] 02-Jul-1961 [id number]  Ortho Devices Type of Ortho Device: [place] cervical collar   Post Interventions Instructions Provided: Care of device   [place] Pink 01/24/2020, 1:26 PM

## 2020-01-24 NOTE — Progress Notes (Signed)
PHARMACIST - PHYSICIAN ORDER COMMUNICATION  CONCERNING: P&T Medication Policy on Herbal Medications  DESCRIPTION:  This patient's order for:  Glucosamine-chondroitin  has been noted.  This product(s) is classified as an "herbal" or natural product. Due to a lack of definitive safety studies or FDA approval, nonstandard manufacturing practices, plus the potential risk of unknown drug-drug interactions while on inpatient medications, the Pharmacy and Therapeutics Committee does not permit the use of "herbal" or natural products of this type within [facility].   ACTION TAKEN: The pharmacy department is unable to verify this order at this time. Please reevaluate patient's clinical condition at discharge and address if the herbal or natural product(s) should be resumed at that time.   Arty Baumgartner, [place] Phone: [phone] 01/24/2020 1:46 PM

## 2020-01-24 NOTE — Op Note (Signed)
PATIENT NAME: [name]   MEDICAL RECORD NO.:   [id number]    DATE OF BIRTH: 1961-07-31   DATE OF PROCEDURE: 01/24/2020                               OPERATIVE REPORT     PREOPERATIVE DIAGNOSES: 1. Right cervical radiculopathy, manifesting as progressive right arm pain and weakness. 2. Spinal stenosis spanning C4-C7, including severe nerve compression and spinal cord compression   POSTOPERATIVE DIAGNOSES: 1. Right cervical radiculopathy, manifesting as progressive right arm pain and weakness. 2. Spinal stenosis spanning C4-C7, including severe nerve compression and spinal cord compression   PROCEDURE: 1. Anterior cervical decompression and fusion C4/5, C5/6, C6/7. 2. Placement of anterior instrumentation, C4-C7. 3. Insertion of interbody device x 3 (Titan intervertebral spacers). 4. Intraoperative use of fluoroscopy. 5. Use of morselized allograft - ViviGen.   SURGEON:  Phylliss Bob, MD   ASSISTANT:  Pricilla Holm, PA-C.   ANESTHESIA:  General endotracheal anesthesia.   COMPLICATIONS:  None.   DISPOSITION:  Stable.   ESTIMATED BLOOD LOSS:  Minimal.   INDICATIONS FOR SURGERY:  Briefly, Mr. Urbanovsky is a pleasant 59 y.o. year- old male, who did present to me with severe pain in the neck as well as progressive pain and weakness in the right arm.  The patient's MRI did reveal prominent findings, as noted above.  Given the patient's ongoing rather debilitating pain and lack of improvement with appropriate treatment measures, we did discuss proceeding with the procedure noted above.  The patient was fully aware of the risks and limitations of surgery as outlined in my preoperative note.   OPERATIVE DETAILS:  On 01/24/2020, the patient was brought to surgery and general endotracheal anesthesia was administered.  The patient was placed supine on the hospital bed. The neck was gently extended.  All bony prominences were meticulously padded.  The neck was prepped and draped  in the usual sterile fashion.  At this point, I did make a left-sided transverse incision.  The platysma was incised.  A Smith-Robinson approach was used and the anterior spine was identified. A self-retaining retractor was placed.  I then subperiosteally exposed the vertebral bodies from C4-C7.  Caspar pins were then placed into the C6 and C7 vertebral bodies and distraction was applied.  A thorough and complete C6-7 intervertebral diskectomy was performed.  The posterior longitudinal ligament was identified and entered using a nerve hook.  I then used #1 followed by #2 Kerrison to perform a thorough and complete intervertebral diskectomy.  The spinal canal was thoroughly decompressed, as was the right and left neuroforamen.  The endplates were then prepared and the appropriate-sized intervertebral spacer was then packed with ViviGen and tamped into position in the usual fashion.  The lower Caspar pin was then removed and placed into the C5 vertebral body and once again, distraction was applied across the C5-6 intervertebral space.  I then again performed a thorough and complete diskectomy, thoroughly decompressing the spinal canal and bilateral neuroforamena.  After preparing the endplates, the appropriate-sized intervertebral spacer was packed with ViviGen and tamped into position.  The lower Caspar pin was then removed and placed into the C4 vertebral body and once again, distraction was applied across the C4-5 intervertebral space.  I then again performed a thorough and complete diskectomy, thoroughly decompressing the spinal canal and bilateral neuroforamena.  After preparing the endplates, the appropriate-sized intervertebral spacer was packed with ViviGen  and tamped into position.  The Caspar pins then were removed and bone wax was placed in their place.  The appropriate-sized anterior cervical plate was placed over the anterior spine.  14 mm variable angle screws were placed, 2 in  each vertebral body from C4-C7 for a total of 8 vertebral body screws.  The screws were then locked to the plate using the Cam locking mechanism.  I was very pleased with the final fluoroscopic images.  The wound was then irrigated.  The wound was then explored for any undue bleeding and there was no bleeding noted. The wound was then closed in layers using 2-0 Vicryl, followed by 4-0 Monocryl.  Benzoin and Steri-Strips were applied, followed by sterile dressing.  All instrument counts were correct at the termination of the procedure.   Of note, Pricilla Holm, PA-C, was my assistant throughout surgery, and did aid in retraction, placement of the hardware, suctioning, and closure from start to finish.      Phylliss Bob, MD

## 2020-01-24 NOTE — H&P (Signed)
PREOPERATIVE H&P  Chief Complaint: Progressive right arm pain and weakness  HPI: [name] is a 59 y.o. male who presents with ongoing pain in the right arm. The pain has become debilitating.  MRI reveals severe NF stenosis in addition to spinal cord compression, spanning C4-C7  Patient has failed multiple forms of conservative care and continues to have pain (see office notes for additional details regarding the patient's full course of treatment)  Past Medical History:  Diagnosis Date  . Allergy   . Asthma    bronchitis  . Colon polyps   . Dysrhythmia    per pt many years "heart skips a beat and feels faster and they put me on meds that I've been taking ever since" 01/21/20  . GERD (gastroesophageal reflux disease)   . Hypertension   . MRSA infection 09/07/2016  . Pleurisy    hx-12/13  . Pneumonia    "double pneumonia and pleurisy around 2014" 01/21/20  . Rectal fissure   . Seasonal allergies   . Skin cancer    skin cancer melanoma "removed off my head many years ago, maybe 15 years ago" 01/21/20   Past Surgical History:  Procedure Laterality Date  . APPENDECTOMY  2006   lap append  . CHOLECYSTECTOMY    . [place]  2005  . HIP SURGERY Right 03/06/2018  . I & D EXTREMITY Left 07/30/2016   Procedure: left knee arthroscopic wash out;  Surgeon: Melrose Nakayama, MD;  Location: [facility];  Service: Orthopedics;  Laterality: Left;  . I & D EXTREMITY Left 08/06/2016   Procedure: IRRIGATION AND DEBRIDEMENT LEFT KNEE W/ SYNOVECTOMY;  Surgeon: Melrose Nakayama, MD;  Location: [facility];  Service: Orthopedics;  Laterality: Left;  . KNEE ARTHROSCOPY  2009   left  . KNEE ARTHROSCOPY Left 06/12/2013   Procedure: LEFT KNEE ARTHROSCOPY PARTIAL MEDIAL MENISECTOMY WITH CHONDROPLASTY;  Surgeon: Hessie Dibble, MD;  Location: [facility];  Service: Orthopedics;  Laterality: Left;  . KNEE SURGERY  2005   lt acl  . NASAL FRACTURE SURGERY  2012   closed reduction   .  SHOULDER SURGERY  2010   left   Social History   Socioeconomic History  . Marital status: Married    Spouse name: Not on file  . Number of children: 5  . Years of education: 85  . Highest education level: Not on file  Occupational History  . Occupation: Teacher, music: [employer]  Tobacco Use  . Smoking status: Former Smoker    Packs/day: 0.25    Years: 4.00    Pack years: 1.00    Types: Cigarettes, Cigars    Quit date: 12/27/1984    Years since quitting: 35.0  . Smokeless tobacco: Former Systems developer    Types: Chew  . Tobacco comment: occasional - "also dipped about 40 years ago"  Substance and Sexual Activity  . Alcohol use: Yes    Comment: "a few on the weekends"  . Drug use: Yes    Frequency: 2.0 times per week    Types: Marijuana, Other-see comments    Comment: "years and years ago I used marijuana"  . Sexual activity: Not on file    Comment: occ cigars-quit cigs  Other Topics Concern  . 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 Difficulty of Paying Living Expenses: Not on file  Food Insecurity:   . Worried About Charity fundraiser in the Last Year: Not on file  . Ran Out of Food in the Last Year: Not on file  Transportation Needs:   . Lack of Transportation (Medical): Not on file  . Lack of Transportation (Non-Medical): Not on file  Physical Activity:   . Days of Exercise per Week: Not on file  . Minutes of Exercise per Session: Not on file  Stress:   . Feeling of Stress : Not on file  Social Connections:   . Frequency of Communication with Friends and Family: Not on file  . Frequency of Social Gatherings with Friends and Family: Not on file  . Attends Religious Services: Not on file  . Active Member of Clubs or Organizations: Not on file  . Attends Archivist Meetings: Not on file  . Marital Status: Not on file   Family History  Problem Relation Age of Onset    . Heart attack Mother   . Skin cancer Mother   . Hypertension Mother   . Hyperlipidemia Mother   . Stroke Mother   . Heart attack Father   . Bipolar disorder Maternal Grandmother   . Arthritis Maternal Grandmother   . Hyperlipidemia Maternal Grandmother   . Hypertension Maternal Grandmother   . Arthritis Maternal Grandfather   . Hyperlipidemia Maternal Grandfather   . Stroke Maternal Grandfather   . Hypertension Maternal Grandfather   . Arthritis Paternal Grandmother   . Hypertension Paternal Grandmother   . Arthritis Paternal Grandfather   . Hypertension Paternal Grandfather   . Hyperlipidemia Brother    Allergies  Allergen Reactions  . Ace Inhibitors Cough  . Codeine Itching, Nausea Only and Rash   Prior to Admission medications   Medication Sig Start Date End Date Taking? Authorizing Provider  albuterol (VENTOLIN HFA) 108 (90 Base) MCG/ACT inhaler Inhale 2 puffs into the lungs every 6 (six) hours as needed for wheezing or shortness of breath. 12/19/19  Yes Biagio Borg, MD  amLODipine (NORVASC) 10 MG tablet TAKE ONE TABLET BY MOUTH DAILY Patient taking differently: Take 10 mg by mouth daily.  09/05/19  Yes Biagio Borg, MD  amphetamine-dextroamphetamine (ADDERALL) 30 MG tablet Take 1 tablet by mouth daily. 07/13/19  Yes Biagio Borg, MD  diphenhydrAMINE-APAP, sleep, (GOODYS PM PO) Take 1 tablet by mouth at bedtime.   Yes [provider]  fluticasone (FLONASE) 50 MCG/ACT nasal spray Place 2 sprays into both nostrils daily as needed for allergies.    Yes [provider]  glucosamine-chondroitin 500-400 MG tablet Take 1 tablet by mouth daily.    Yes [provider]  Multiple Vitamin (MULTIVITAMIN WITH MINERALS) TABS tablet Take 1 tablet by mouth daily.   Yes [provider]  pantoprazole (PROTONIX) 40 MG tablet TAKE ONE TABLET BY MOUTH DAILY Patient taking differently: Take 40 mg by mouth daily.  09/28/19  Yes Biagio Borg, MD  telmisartan  (MICARDIS) 20 MG tablet Take 1 tablet (20 mg total) by mouth daily. 03/13/19  Yes Biagio Borg, MD  TURMERIC PO Take 1 tablet by mouth daily.   Yes [provider]  aspirin 81 MG EC tablet Take 1 tablet (81 mg total) by mouth daily. Swallow whole. Patient not taking: Reported on 01/18/2020 09/10/17   Biagio Borg, MD  meloxicam (MOBIC) 15 MG tablet Take 1 tablet (15 mg total) by mouth daily. Patient not taking: Reported on 01/18/2020 10/27/17   Clearance Coots  E, MD  sildenafil (VIAGRA) 100 MG tablet Take 0.5-1 tablets (50-100 mg total) by mouth daily as needed for erectile dysfunction. Patient not taking: Reported on 01/18/2020 07/10/18   Biagio Borg, MD     All other systems have been reviewed and were otherwise negative with the exception of those mentioned in the HPI and as above.  Physical Exam: Vitals:   01/24/20 0618  BP: (!) 179/91  Pulse: 86  Resp: 18  Temp: 98.5 F (36.9 C)  SpO2: 97%    Body mass index is 29.93 kg/m.  General: Alert, no acute distress Cardiovascular: No pedal edema Respiratory: No cyanosis, no use of accessory musculature Skin: No lesions in the area of chief complaint Neurologic: Sensation intact distally Psychiatric: Patient is competent for consent with normal mood and affect Lymphatic: No axillary or cervical lymphadenopathy  MUSCULOSKELETAL: + spurling sign on the right with 4/5 strength to elbow extension on the right.   Assessment/Plan: RIGHT-SIDED CERVICAL RADICULOPATHY, MULTILEVEL NEUROFORAMINAL STENOSIS  Plan for Procedure(s): ANTERIOR CERVICAL DECOMPRESSION FUSION, CERVICAL 4-5, CERVICAL 5-6, CERVIXAL 6-7  Of note, this patient's surgery is being performed at a time when only urgent cases are to be performed at this hospital due to the pandemic. Due to the patient's progressive pain and weakness, severe nerve compression, and spinal cord compression clearly noted on his MRI, I do feel that the potential for permanent neurologic  deficit would be present if there were to be additional undue delay of his surgery.   Norva Karvonen, MD 01/24/2020 7:14 AM

## 2020-01-24 NOTE — Transfer of Care (Signed)
Immediate Anesthesia Transfer of Care Note  Patient: [name]  Procedure(s) Performed: ANTERIOR CERVICAL DECOMPRESSION FUSION, CERVICAL 4-5, CERVICAL 5-6, CERVIXAL 6-7 (N/A )  Patient Location: PACU  Anesthesia Type:General  Level of Consciousness: awake, alert  and oriented  Airway & Oxygen Therapy: Patient Spontanous Breathing and Patient connected to face mask oxygen  Post-op Assessment: Report given to RN and Post -op Vital signs reviewed and stable  Post vital signs: Reviewed and stable  Last Vitals:  Vitals Value Taken Time  BP 153/83 01/24/20 1106  Temp    Pulse 95 01/24/20 1107  Resp 16 01/24/20 1107  SpO2 95 % 01/24/20 1107  Vitals shown include unvalidated device data.  Last Pain:  Vitals:   01/24/20 0601  PainSc: 5          Complications: No apparent anesthesia complications

## 2020-01-25 ENCOUNTER — Encounter: Payer: Self-pay | Admitting: *Deleted

## 2020-01-25 DIAGNOSIS — F988 Other specified behavioral and emotional disorders with onset usually occurring in childhood and adolescence: Secondary | ICD-10-CM | POA: Diagnosis not present

## 2020-01-25 DIAGNOSIS — M5412 Radiculopathy, cervical region: Secondary | ICD-10-CM | POA: Diagnosis not present

## 2020-01-25 DIAGNOSIS — Z85820 Personal history of malignant melanoma of skin: Secondary | ICD-10-CM | POA: Diagnosis not present

## 2020-01-25 DIAGNOSIS — M1990 Unspecified osteoarthritis, unspecified site: Secondary | ICD-10-CM | POA: Diagnosis not present

## 2020-01-25 DIAGNOSIS — Z8616 Personal history of COVID-19: Secondary | ICD-10-CM | POA: Diagnosis not present

## 2020-01-25 DIAGNOSIS — J449 Chronic obstructive pulmonary disease, unspecified: Secondary | ICD-10-CM | POA: Diagnosis not present

## 2020-01-25 DIAGNOSIS — I10 Essential (primary) hypertension: Secondary | ICD-10-CM | POA: Diagnosis not present

## 2020-01-25 DIAGNOSIS — M4802 Spinal stenosis, cervical region: Secondary | ICD-10-CM | POA: Diagnosis not present

## 2020-01-25 DIAGNOSIS — M549 Dorsalgia, unspecified: Secondary | ICD-10-CM | POA: Diagnosis not present

## 2020-01-25 DIAGNOSIS — G9520 Unspecified cord compression: Secondary | ICD-10-CM | POA: Diagnosis not present

## 2020-01-25 DIAGNOSIS — Z87891 Personal history of nicotine dependence: Secondary | ICD-10-CM | POA: Diagnosis not present

## 2020-01-25 DIAGNOSIS — Z79899 Other long term (current) drug therapy: Secondary | ICD-10-CM | POA: Diagnosis not present

## 2020-01-25 DIAGNOSIS — Z8249 Family history of ischemic heart disease and other diseases of the circulatory system: Secondary | ICD-10-CM | POA: Diagnosis not present

## 2020-01-25 DIAGNOSIS — Z7982 Long term (current) use of aspirin: Secondary | ICD-10-CM | POA: Diagnosis not present

## 2020-01-25 DIAGNOSIS — K219 Gastro-esophageal reflux disease without esophagitis: Secondary | ICD-10-CM | POA: Diagnosis not present

## 2020-01-25 DIAGNOSIS — G8929 Other chronic pain: Secondary | ICD-10-CM | POA: Diagnosis not present

## 2020-01-25 MED ORDER — METHOCARBAMOL 500 MG PO TABS: 500.0000 mg | ORAL_TABLET | Freq: Four times a day (QID) | ORAL | 2 refills | Status: DC | PRN

## 2020-01-25 MED ORDER — OXYCODONE-ACETAMINOPHEN 5-325 MG PO TABS: 1.0000 | ORAL_TABLET | ORAL | 0 refills | Status: DC | PRN

## 2020-01-25 MED FILL — Thrombin (Recombinant) For Soln 20000 Unit: CUTANEOUS | Qty: 1 | Status: AC

## 2020-01-25 NOTE — Plan of Care (Signed)
Patient alert and oriented, mae's well, voiding adequate amount of urine, swallowing without difficulty, no c/o pain at time of discharge. Patient discharged home with family. Script and discharged instructions given to patient. Patient and family stated understanding of instructions given. Patient has an appointment with Dr. Dumonski 

## 2020-01-25 NOTE — Progress Notes (Signed)
    Patient doing well  Denies arm pain Tolerating PO well   Physical Exam: Vitals:   01/24/20 2233 01/25/20 0339  BP: (!) 157/94 (!) 165/93  Pulse: 77 83  Resp: 20 18  Temp: 97.7 F (36.5 C) 97.7 F (36.5 C)  SpO2: 98% 97%    Neck soft/supple Dressing in place NVI  POD #1 s/p ACDF, doing well  - encourage ambulation - Percocet for pain, Robaxin for muscle spasms - d/c home today with f/u in 2 weeks

## 2020-01-31 NOTE — Discharge Summary (Signed)
Patient ID: [name] MRN: [id number] DOB/AGE: 60/05/1961 60 y.o.  Admit date: 01/24/2020 Discharge date: 01/25/2020  Admission Diagnoses:  Active Problems:   Radiculopathy   Discharge Diagnoses:  Same  Past Medical History:  Diagnosis Date  . Allergy   . Asthma    bronchitis  . Colon polyps   . Dysrhythmia    per pt many years "heart skips a beat and feels faster and they put me on meds that I've been taking ever since" 01/21/20  . GERD (gastroesophageal reflux disease)   . Hypertension   . MRSA infection 09/07/2016  . Pleurisy    hx-12/13  . Pneumonia    "double pneumonia and pleurisy around 2014" 01/21/20  . Rectal fissure   . Seasonal allergies   . Skin cancer    skin cancer melanoma "removed off my head many years ago, maybe 15 years ago" 01/21/20    Surgeries: Procedure(s): ANTERIOR CERVICAL DECOMPRESSION FUSION, CERVICAL 4-5, CERVICAL 5-6, CERVIXAL 6-7 on 01/24/2020   Consultants: None  Discharged Condition: Improved  Hospital Course: [name] is an 59 y.o. male who was admitted 01/24/2020 for operative treatment of radiculopathy. Patient has severe unremitting pain that affects sleep, daily activities, and work/hobbies. After pre-op clearance the patient was taken to the operating room on 01/24/2020 and underwent  Procedure(s): ANTERIOR CERVICAL DECOMPRESSION FUSION, CERVICAL 4-5, CERVICAL 5-6, CERVIXAL 6-7.    Patient was given perioperative antibiotics:  Anti-infectives (From admission, onward)   Start     Dose/Rate Route Frequency Ordered Stop   01/24/20 1600  ceFAZolin (ANCEF) IVPB 2g/100 mL premix     2 g 200 mL/hr over 30 Minutes Intravenous Every 8 hours 01/24/20 1255 01/24/20 2315   01/24/20 0600  ceFAZolin (ANCEF) IVPB 2g/100 mL premix     2 g 200 mL/hr over 30 Minutes Intravenous On call to O.R. 01/24/20 0549 01/24/20 0820       Patient was given sequential compression devices, early ambulation to prevent DVT.  Patient benefited  maximally from hospital stay and there were no complications.    Recent vital signs: BP (!) 176/93 (BP Location: Left Arm)   Pulse 75   Temp 97.7 F (36.5 C) (Oral)   Resp 16   Ht 5\' 9"  (1.753 m)   Wt 91.9 kg   SpO2 100%   BMI 29.93 kg/m    Discharge Medications:   Allergies as of 01/25/2020      Reactions   Ace Inhibitors Cough   Codeine Itching, Nausea Only, Rash      Medication List    TAKE these medications   albuterol 108 (90 Base) MCG/ACT inhaler Commonly known as: VENTOLIN HFA Inhale 2 puffs into the lungs every 6 (six) hours as needed for wheezing or shortness of breath.   amLODipine 10 MG tablet Commonly known as: NORVASC TAKE ONE TABLET BY MOUTH DAILY   amphetamine-dextroamphetamine 30 MG tablet Commonly known as: ADDERALL Take 1 tablet by mouth daily.   fluticasone 50 MCG/ACT nasal spray Commonly known as: FLONASE Place 2 sprays into both nostrils daily as needed for allergies.   glucosamine-chondroitin 500-400 MG tablet Take 1 tablet by mouth daily.   GOODYS PM PO Take 1 tablet by mouth at bedtime.   methocarbamol 500 MG tablet Commonly known as: ROBAXIN Take 1 tablet (500 mg total) by mouth every 6 (six) hours as needed for muscle spasms.   multivitamin with minerals Tabs tablet Take 1 tablet by mouth daily.   oxyCODONE-acetaminophen  5-325 MG tablet Commonly known as: PERCOCET/ROXICET Take 1-2 tablets by mouth every 4 (four) hours as needed for moderate pain or severe pain.   pantoprazole 40 MG tablet Commonly known as: PROTONIX TAKE ONE TABLET BY MOUTH DAILY   sildenafil 100 MG tablet Commonly known as: Viagra Take 0.5-1 tablets (50-100 mg total) by mouth daily as needed for erectile dysfunction.   telmisartan 20 MG tablet Commonly known as: Micardis Take 1 tablet (20 mg total) by mouth daily.       Diagnostic Studies: DG Cervical Spine 2-3 Views  Result Date: 01/24/2020 CLINICAL DATA:  Cervical fusion EXAM: DG C-ARM 1-60 MIN;  CERVICAL SPINE - 2-3 VIEW COMPARISON:  None. FLUOROSCOPY TIME:  Fluoroscopy Time:  14 seconds Radiation Exposure Index (if provided by the fluoroscopic device): Not available Number of Acquired Spot Images: 2 FINDINGS: Completion films demonstrate evidence of fusion from C4 to C7 with anterior fixation. IMPRESSION: Anterior cervical fusion from C4-C7. Electronically Signed   By: Inez Catalina M.D.   On: 01/24/2020 11:06   DG C-Arm 1-60 Min  Result Date: 01/24/2020 CLINICAL DATA:  Cervical fusion EXAM: DG C-ARM 1-60 MIN; CERVICAL SPINE - 2-3 VIEW COMPARISON:  None. FLUOROSCOPY TIME:  Fluoroscopy Time:  14 seconds Radiation Exposure Index (if provided by the fluoroscopic device): Not available Number of Acquired Spot Images: 2 FINDINGS: Completion films demonstrate evidence of fusion from C4 to C7 with anterior fixation. IMPRESSION: Anterior cervical fusion from C4-C7. Electronically Signed   By: Inez Catalina M.D.   On: 01/24/2020 11:06    Disposition: Discharge disposition: 01-Home or Self Care        POD #1 s/p ACDF, doing well  - encourage ambulation - Percocet for pain, Robaxin for muscle spasms -Scripts for pain sent to pharmacy electronically  -D/C instructions sheet printed and in chart -D/C today  -F/U in office 2 weeks   Signed: Lennie Muckle Rebekah Sprinkle 01/31/2020, 11:23 AM

## 2020-02-06 DIAGNOSIS — Z9889 Other specified postprocedural states: Secondary | ICD-10-CM | POA: Diagnosis not present

## 2020-02-21 DIAGNOSIS — Z981 Arthrodesis status: Secondary | ICD-10-CM | POA: Diagnosis not present

## 2020-02-21 DIAGNOSIS — M542 Cervicalgia: Secondary | ICD-10-CM | POA: Diagnosis not present

## 2020-03-02 ENCOUNTER — Other Ambulatory Visit: Payer: Self-pay | Admitting: Internal Medicine

## 2020-03-02 NOTE — Telephone Encounter (Signed)
Please refill as per office routine med refill policy (all routine meds refilled for 3 mo or monthly per pt preference up to one year from last visit, then month to month grace period for 3 mo, then further med refills will have to be denied)  

## 2020-03-06 DIAGNOSIS — F902 Attention-deficit hyperactivity disorder, combined type: Secondary | ICD-10-CM | POA: Diagnosis not present

## 2020-03-06 DIAGNOSIS — Z79899 Other long term (current) drug therapy: Secondary | ICD-10-CM | POA: Diagnosis not present

## 2020-03-06 DIAGNOSIS — F411 Generalized anxiety disorder: Secondary | ICD-10-CM | POA: Diagnosis not present

## 2020-03-07 DIAGNOSIS — Z9889 Other specified postprocedural states: Secondary | ICD-10-CM | POA: Diagnosis not present

## 2020-03-19 DIAGNOSIS — M79641 Pain in right hand: Secondary | ICD-10-CM | POA: Diagnosis not present

## 2020-03-19 DIAGNOSIS — M25551 Pain in right hip: Secondary | ICD-10-CM | POA: Diagnosis not present

## 2020-03-19 DIAGNOSIS — M79642 Pain in left hand: Secondary | ICD-10-CM | POA: Diagnosis not present

## 2020-04-03 ENCOUNTER — Other Ambulatory Visit: Payer: Self-pay | Admitting: Internal Medicine

## 2020-05-27 DIAGNOSIS — M79645 Pain in left finger(s): Secondary | ICD-10-CM | POA: Diagnosis not present

## 2020-05-27 DIAGNOSIS — M25562 Pain in left knee: Secondary | ICD-10-CM | POA: Diagnosis not present

## 2020-05-27 DIAGNOSIS — M79644 Pain in right finger(s): Secondary | ICD-10-CM | POA: Diagnosis not present

## 2020-06-04 DIAGNOSIS — Z79899 Other long term (current) drug therapy: Secondary | ICD-10-CM | POA: Diagnosis not present

## 2020-06-04 DIAGNOSIS — F902 Attention-deficit hyperactivity disorder, combined type: Secondary | ICD-10-CM | POA: Diagnosis not present

## 2020-06-09 ENCOUNTER — Other Ambulatory Visit: Payer: Self-pay | Admitting: Internal Medicine

## 2020-06-09 NOTE — Telephone Encounter (Signed)
Please refill as per office routine med refill policy (all routine meds refilled for 3 mo or monthly per pt preference up to one year from last visit, then month to month grace period for 3 mo, then further med refills will have to be denied)  

## 2020-06-26 DIAGNOSIS — K219 Gastro-esophageal reflux disease without esophagitis: Secondary | ICD-10-CM | POA: Diagnosis not present

## 2020-06-26 DIAGNOSIS — R0989 Other specified symptoms and signs involving the circulatory and respiratory systems: Secondary | ICD-10-CM | POA: Diagnosis not present

## 2020-07-12 ENCOUNTER — Other Ambulatory Visit: Payer: Self-pay | Admitting: Internal Medicine

## 2020-07-12 NOTE — Telephone Encounter (Signed)
Please refill as per office routine med refill policy (all routine meds refilled for 3 mo or monthly per pt preference up to one year from last visit, then month to month grace period for 3 mo, then further med refills will have to be denied)  

## 2020-07-15 ENCOUNTER — Ambulatory Visit (INDEPENDENT_AMBULATORY_CARE_PROVIDER_SITE_OTHER): Payer: BLUE CROSS/BLUE SHIELD | Admitting: Internal Medicine

## 2020-07-15 ENCOUNTER — Other Ambulatory Visit: Payer: Self-pay

## 2020-07-15 ENCOUNTER — Encounter: Payer: Self-pay | Admitting: Internal Medicine

## 2020-07-15 VITALS — BP 160/90 | HR 61 | Temp 99.2°F | Ht 69.0 in | Wt 190.0 lb

## 2020-07-15 DIAGNOSIS — I10 Essential (primary) hypertension: Secondary | ICD-10-CM

## 2020-07-15 DIAGNOSIS — Z0001 Encounter for general adult medical examination with abnormal findings: Secondary | ICD-10-CM

## 2020-07-15 DIAGNOSIS — R351 Nocturia: Secondary | ICD-10-CM | POA: Insufficient documentation

## 2020-07-15 DIAGNOSIS — Z0000 Encounter for general adult medical examination without abnormal findings: Secondary | ICD-10-CM

## 2020-07-15 DIAGNOSIS — R739 Hyperglycemia, unspecified: Secondary | ICD-10-CM

## 2020-07-15 DIAGNOSIS — J301 Allergic rhinitis due to pollen: Secondary | ICD-10-CM

## 2020-07-15 DIAGNOSIS — R21 Rash and other nonspecific skin eruption: Secondary | ICD-10-CM | POA: Diagnosis not present

## 2020-07-15 DIAGNOSIS — R0989 Other specified symptoms and signs involving the circulatory and respiratory systems: Secondary | ICD-10-CM

## 2020-07-15 MED ORDER — TAMSULOSIN HCL 0.4 MG PO CAPS: 0.4000 mg | ORAL_CAPSULE | Freq: Every day | ORAL | 11 refills | Status: DC

## 2020-07-15 MED ORDER — TRIAMCINOLONE ACETONIDE 55 MCG/ACT NA AERO: 2.0000 | INHALATION_SPRAY | Freq: Every day | NASAL | 12 refills | Status: DC

## 2020-07-15 MED ORDER — TRIAMCINOLONE ACETONIDE 0.1 % EX CREA: 1.0000 "application " | TOPICAL_CREAM | Freq: Two times a day (BID) | CUTANEOUS | 2 refills | Status: AC

## 2020-07-15 MED ORDER — TELMISARTAN 20 MG PO TABS: ORAL_TABLET | ORAL | 3 refills | Status: DC

## 2020-07-15 MED ORDER — FEXOFENADINE HCL 180 MG PO TABS: 180.0000 mg | ORAL_TABLET | Freq: Every day | ORAL | 11 refills | Status: DC

## 2020-07-15 NOTE — Assessment & Plan Note (Signed)
stable overall by history and exam, recent data reviewed with pt, and pt to continue medical treatment as before,  to f/u any worsening symptoms or concerns  

## 2020-07-15 NOTE — Assessment & Plan Note (Addendum)
Ok for steroid cream prn,  to f/u any worsening symptoms or concerns  I spent 41 minutes in addition to time for CPX wellness examination in preparing to see the patient by review of recent labs, imaging and procedures, obtaining and reviewing separately obtained history, communicating with the patient and family or caregiver, ordering medications, tests or procedures, and documenting clinical information in the EHR including the differential Dx, treatment, and any further evaluation and other management of rash, nocturia, hyperglycemia, labile htn, allergies

## 2020-07-15 NOTE — Assessment & Plan Note (Signed)
Ok for trial flomax,  to f/u any worsening symptoms or concerns

## 2020-07-15 NOTE — Assessment & Plan Note (Signed)
More labile recently, cont same tx, r/o pheo  With labs

## 2020-07-15 NOTE — Assessment & Plan Note (Signed)

## 2020-07-15 NOTE — Assessment & Plan Note (Signed)
Ok for [employer] and [employer]

## 2020-07-15 NOTE — Patient Instructions (Addendum)
OK to change the Flonase to Nasacort and the Allegra 180 mg per day  Please take all new medication as prescribed - the steroid cream, and the flomax  Please continue all other medications as before, and refills have been done if requested.  Please have the pharmacy call with any other refills you may need.  Please continue your efforts at being more active, low cholesterol diet, and weight control.  You are otherwise up to date with prevention measures today.  Please keep your appointments with your specialists as you may have planned  Please go to the LAB at the blood drawing area for the tests to be done - on Thursday as you mentioned  You will be contacted by phone if any changes need to be made immediately.  Otherwise, you will receive a letter about your results with an explanation, but please check with MyChart first.  Please remember to sign up for MyChart if you have not done so, as this will be important to you in the future with finding out test results, communicating by private email, and scheduling acute appointments online when needed.  Please make an Appointment to return for your 1 year visit, or sooner if needed

## 2020-07-15 NOTE — Progress Notes (Signed)
Subjective:    Patient ID: [name], male    DOB: 1961-04-26, 59 y.o.   MRN: [id number]  HPI  Here for wellness and f/u;  Overall doing ok;  Pt denies Chest pain, worsening SOB, DOE, wheezing, orthopnea, PND, worsening LE edema, palpitations, dizziness or syncope.  Pt denies neurological change such as new headache, facial or extremity weakness.  Pt denies polydipsia, polyuria, or low sugar symptoms. Pt states overall good compliance with treatment and medications, good tolerability, and has been trying to follow appropriate diet.  Pt denies worsening depressive symptoms, suicidal ideation or panic. No fever, night sweats, wt loss, loss of appetite, or other constitutional symptoms.  Pt states good ability with ADL's, has low fall risk, home safety reviewed and adequate, no other significant changes in hearing or vision, and only occasionally active with exercise. Does have several wks ongoing nasal allergy symptoms with clearish congestion, itch and sneezing, without fever, pain, ST, cough, swelling or wheezing. Flonase not working well enough   aLso has had labile HTN at home with sweating for several weeks.  Also has ongoing itchy rash to left elbow area , dermatology appts are 6 mo out, asking for steroid tx.  Also with nocturia x 4 most nights recently with some ? Mild retention Past Medical History:  Diagnosis Date  . Allergy   . Asthma    bronchitis  . Colon polyps   . Dysrhythmia    per pt many years "heart skips a beat and feels faster and they put me on meds that I've been taking ever since" 01/21/20  . GERD (gastroesophageal reflux disease)   . Hypertension   . MRSA infection 09/07/2016  . Pleurisy    hx-12/13  . Pneumonia    "double pneumonia and pleurisy around 2014" 01/21/20  . Rectal fissure   . Seasonal allergies   . Skin cancer    skin cancer melanoma "removed off my head many years ago, maybe 15 years ago" 01/21/20   Past Surgical History:  Procedure Laterality Date    . ANTERIOR CERVICAL DECOMP/DISCECTOMY FUSION N/A 01/24/2020   Procedure: ANTERIOR CERVICAL DECOMPRESSION FUSION, CERVICAL 4-5, CERVICAL 5-6, CERVIXAL 6-7;  Surgeon: Phylliss Bob, MD;  Location: [place];  Service: Orthopedics;  Laterality: N/A;  . APPENDECTOMY  2006   lap append  . CHOLECYSTECTOMY    . [place]  2005  . HIP SURGERY Right 03/06/2018  . I & D EXTREMITY Left 07/30/2016   Procedure: left knee arthroscopic wash out;  Surgeon: Melrose Nakayama, MD;  Location: [facility];  Service: Orthopedics;  Laterality: Left;  . I & D EXTREMITY Left 08/06/2016   Procedure: IRRIGATION AND DEBRIDEMENT LEFT KNEE W/ SYNOVECTOMY;  Surgeon: Melrose Nakayama, MD;  Location: [place];  Service: Orthopedics;  Laterality: Left;  . KNEE ARTHROSCOPY  2009   left  . KNEE ARTHROSCOPY Left 06/12/2013   Procedure: LEFT KNEE ARTHROSCOPY PARTIAL MEDIAL MENISECTOMY WITH CHONDROPLASTY;  Surgeon: Hessie Dibble, MD;  Location: [place];  Service: Orthopedics;  Laterality: Left;  . KNEE SURGERY  2005   lt acl  . NASAL FRACTURE SURGERY  2012   closed reduction   . SHOULDER SURGERY  2010   left    reports that he quit smoking about 35 years ago. His smoking use included cigarettes and cigars. He has a 1.00 pack-year smoking history. He has quit using smokeless tobacco.  His smokeless tobacco use included chew. He reports current alcohol use. He reports  current drug use. Frequency: 2.00 times per week. Drugs: Marijuana and Other-see comments. family history includes Arthritis in his maternal grandfather, maternal grandmother, paternal grandfather, and paternal grandmother; Bipolar disorder in his maternal grandmother; Heart attack in his father and mother; Hyperlipidemia in his brother, maternal grandfather, maternal grandmother, and mother; Hypertension in his maternal grandfather, maternal grandmother, mother, paternal grandfather, and paternal grandmother; Skin cancer in his mother; Stroke in his maternal  grandfather and mother. Allergies  Allergen Reactions  . Ace Inhibitors Cough  . Codeine Itching, Nausea Only and Rash   Current Outpatient Medications on File Prior to Visit  Medication Sig Dispense Refill  . albuterol (VENTOLIN HFA) 108 (90 Base) MCG/ACT inhaler Inhale 2 puffs into the lungs every 6 (six) hours as needed for wheezing or shortness of breath. 18 g 5  . amLODipine (NORVASC) 10 MG tablet TAKE ONE TABLET BY MOUTH DAILY (Patient taking differently: Take 10 mg by mouth daily. ) 90 tablet 3  . amphetamine-dextroamphetamine (ADDERALL) 30 MG tablet Take 1 tablet by mouth daily. 30 tablet 0  . fluticasone (FLONASE) 50 MCG/ACT nasal spray Place 2 sprays into both nostrils daily as needed for allergies.     [name] glucosamine-chondroitin 500-400 MG tablet Take 1 tablet by mouth daily.     . methocarbamol (ROBAXIN) 500 MG tablet Take 1 tablet (500 mg total) by mouth every 6 (six) hours as needed for muscle spasms. 30 tablet 2  . Multiple Vitamin (MULTIVITAMIN WITH MINERALS) TABS tablet Take 1 tablet by mouth daily.    [name] oxyCODONE-acetaminophen (PERCOCET/ROXICET) 5-325 MG tablet Take 1-2 tablets by mouth every 4 (four) hours as needed for moderate pain or severe pain. 30 tablet 0  . pantoprazole (PROTONIX) 40 MG tablet TAKE ONE TABLET BY MOUTH DAILY **NEED APPOINTMENT FOR REFILLS** 60 tablet 0  . sildenafil (VIAGRA) 100 MG tablet Take 0.5-1 tablets (50-100 mg total) by mouth daily as needed for erectile dysfunction. 10 tablet 11   No current facility-administered medications on file prior to visit.   Review of Systems All otherwise neg per pt     Objective:   Physical Exam BP (!) 160/90 (BP Location: Left Arm, Patient Position: Sitting, Cuff Size: Large)   Pulse 61   Temp 99.2 F (37.3 C) (Oral)   Ht 5\' 9"  (1.753 m)   Wt 190 lb (86.2 kg)   SpO2 96%   BMI 28.06 kg/m  VS noted,  Constitutional: Pt appears in NAD HENT: Head: NCAT.  Right Ear: External ear normal.  Left Ear:  External ear normal.  Eyes: . Pupils are equal, round, and reactive to light. Conjunctivae and EOM are normal Nose: without d/c or deformity Neck: Neck supple. Gross normal ROM Cardiovascular: Normal rate and regular rhythm.   Pulmonary/Chest: Effort normal and breath sounds without rales or wheezing.  Abd:  Soft, NT, ND, + BS, no organomegaly Neurological: Pt is alert. At baseline orientation, motor grossly intact Skin: Skin is warm. + left lateral elbow area non tender erythem rashes, other new lesions, no LE edema Psychiatric: Pt behavior is normal without agitation  All otherwise neg per pt Lab Results  Component Value Date   WBC 7.4 01/21/2020   HGB 15.3 01/21/2020   HCT 45.7 01/21/2020   PLT 305 01/21/2020   GLUCOSE 99 01/21/2020   CHOL 172 07/13/2019   TRIG 266.0 (H) 07/13/2019   HDL 42.00 07/13/2019   LDLDIRECT 92.0 07/13/2019   LDLCALC 91 07/07/2017   ALT 36 01/21/2020   AST 21  01/21/2020   NA 138 01/21/2020   K 4.2 01/21/2020   CL 104 01/21/2020   CREATININE 1.12 01/21/2020   BUN 14 01/21/2020   CO2 23 01/21/2020   TSH 1.55 07/13/2019   PSA 1.11 07/13/2019   INR 0.9 01/21/2020   HGBA1C 5.6 07/13/2019      Assessment & Plan:

## 2020-08-05 DIAGNOSIS — M1611 Unilateral primary osteoarthritis, right hip: Secondary | ICD-10-CM | POA: Diagnosis not present

## 2020-08-05 DIAGNOSIS — M25551 Pain in right hip: Secondary | ICD-10-CM | POA: Diagnosis not present

## 2020-08-20 DIAGNOSIS — M25551 Pain in right hip: Secondary | ICD-10-CM | POA: Diagnosis not present

## 2020-08-28 ENCOUNTER — Other Ambulatory Visit: Payer: BLUE CROSS/BLUE SHIELD

## 2020-08-29 ENCOUNTER — Other Ambulatory Visit: Payer: Self-pay

## 2020-08-29 ENCOUNTER — Other Ambulatory Visit: Payer: BLUE CROSS/BLUE SHIELD

## 2020-08-29 DIAGNOSIS — Z0001 Encounter for general adult medical examination with abnormal findings: Secondary | ICD-10-CM

## 2020-08-29 DIAGNOSIS — Z01812 Encounter for preprocedural laboratory examination: Secondary | ICD-10-CM

## 2020-08-29 DIAGNOSIS — R739 Hyperglycemia, unspecified: Secondary | ICD-10-CM

## 2020-08-29 DIAGNOSIS — R0989 Other specified symptoms and signs involving the circulatory and respiratory systems: Secondary | ICD-10-CM

## 2020-08-30 LAB — COMPLETE METABOLIC PANEL WITH GFR
AG Ratio: 1.6 (calc) (ref 1.0–2.5)
ALT: 23 U/L (ref 9–46)
AST: 18 U/L (ref 10–35)
Albumin: 4.2 g/dL (ref 3.6–5.1)
Alkaline phosphatase (APISO): 61 U/L (ref 35–144)
BUN: 11 mg/dL (ref 7–25)
CO2: 26 mmol/L (ref 20–32)
Calcium: 9.4 mg/dL (ref 8.6–10.3)
Chloride: 103 mmol/L (ref 98–110)
Creat: 0.87 mg/dL (ref 0.70–1.33)
GFR, Est African American: 109 mL/min/{1.73_m2} (ref >=60)
GFR, Est Non African American: 94 mL/min/{1.73_m2} (ref >=60)
Globulin: 2.6 g/dL (calc) (ref 1.9–3.7)
Glucose, Bld: 119 mg/dL — ABNORMAL HIGH (ref 65–99)
Potassium: 5.1 mmol/L (ref 3.5–5.3)
Sodium: 138 mmol/L (ref 135–146)
Total Bilirubin: 0.4 mg/dL (ref 0.2–1.2)
Total Protein: 6.8 g/dL (ref 6.1–8.1)

## 2020-08-30 LAB — CBC WITH DIFFERENTIAL/PLATELET
Absolute Monocytes: 436 cells/uL (ref 200–950)
Basophils Absolute: 20 cells/uL (ref 0–200)
Basophils Relative: 0.3 %
Eosinophils Absolute: 208 cells/uL (ref 15–500)
Eosinophils Relative: 3.2 %
HCT: 44.5 % (ref 38.5–50.0)
Hemoglobin: 14.9 g/dL (ref 13.2–17.1)
Lymphs Abs: 2347 cells/uL (ref 850–3900)
MCH: 30.2 pg (ref 27.0–33.0)
MCHC: 33.5 g/dL (ref 32.0–36.0)
MCV: 90.1 fL (ref 80.0–100.0)
MPV: 10.5 fL (ref 7.5–12.5)
Monocytes Relative: 6.7 %
Neutro Abs: 3491 cells/uL (ref 1500–7800)
Neutrophils Relative %: 53.7 %
Platelets: 300 10*3/uL (ref 140–400)
RBC: 4.94 10*6/uL (ref 4.20–5.80)
RDW: 13.4 % (ref 11.0–15.0)
Total Lymphocyte: 36.1 %
WBC: 6.5 10*3/uL (ref 3.8–10.8)

## 2020-08-30 LAB — URINALYSIS, ROUTINE W REFLEX MICROSCOPIC
Bilirubin Urine: NEGATIVE
Glucose, UA: NEGATIVE
Hgb urine dipstick: NEGATIVE
Ketones, ur: NEGATIVE
Leukocytes,Ua: NEGATIVE
Nitrite: NEGATIVE
Protein, ur: NEGATIVE
Specific Gravity, Urine: 1.006 (ref 1.001–1.03)
pH: 7 (ref 5.0–8.0)

## 2020-08-30 LAB — HEMOGLOBIN A1C
Hgb A1c MFr Bld: 5.3 % of total Hgb (ref <5.7)
Mean Plasma Glucose: 105 (calc)
eAG (mmol/L): 5.8 (calc)

## 2020-08-30 LAB — PSA: PSA: 1 ng/mL (ref <=4.0)

## 2020-08-30 LAB — PROTIME-INR
INR: 0.9
Prothrombin Time: 10 s (ref 9.0–11.5)

## 2020-08-30 LAB — LIPID PANEL
Cholesterol: 166 mg/dL (ref <200)
HDL: 61 mg/dL (ref >=40)
LDL Cholesterol (Calc): 86 mg/dL (calc)
Non-HDL Cholesterol (Calc): 105 mg/dL (calc) (ref <130)
Total CHOL/HDL Ratio: 2.7 (calc) (ref <5.0)
Triglycerides: 92 mg/dL (ref <150)

## 2020-08-30 LAB — TSH: TSH: 1.57 mIU/L (ref 0.40–4.50)

## 2020-09-03 DIAGNOSIS — F411 Generalized anxiety disorder: Secondary | ICD-10-CM | POA: Diagnosis not present

## 2020-09-03 DIAGNOSIS — Z79899 Other long term (current) drug therapy: Secondary | ICD-10-CM | POA: Diagnosis not present

## 2020-09-03 DIAGNOSIS — F902 Attention-deficit hyperactivity disorder, combined type: Secondary | ICD-10-CM | POA: Diagnosis not present

## 2020-09-09 ENCOUNTER — Other Ambulatory Visit: Payer: Self-pay

## 2020-09-09 ENCOUNTER — Encounter: Payer: Self-pay | Admitting: Internal Medicine

## 2020-09-09 ENCOUNTER — Ambulatory Visit (INDEPENDENT_AMBULATORY_CARE_PROVIDER_SITE_OTHER): Payer: BLUE CROSS/BLUE SHIELD | Admitting: Internal Medicine

## 2020-09-09 DIAGNOSIS — I10 Essential (primary) hypertension: Secondary | ICD-10-CM

## 2020-09-09 DIAGNOSIS — R739 Hyperglycemia, unspecified: Secondary | ICD-10-CM | POA: Diagnosis not present

## 2020-09-09 DIAGNOSIS — Z01818 Encounter for other preprocedural examination: Secondary | ICD-10-CM | POA: Insufficient documentation

## 2020-09-09 NOTE — Assessment & Plan Note (Addendum)

## 2020-09-09 NOTE — Assessment & Plan Note (Signed)
[place] for [employer] as planned, pt to check if a form needs to be signed for ortho

## 2020-09-09 NOTE — Progress Notes (Signed)
Subjective:    Patient ID: [name], male    DOB: 1961-01-18, 59 y.o.   MRN: [id number]  HPI  Here to f/u; overall doing ok,  Pt denies chest pain, increasing sob or doe, wheezing, orthopnea, PND, increased LE swelling, palpitations, dizziness or syncope.  Pt denies new neurological symptoms such as new headache, or facial or extremity weakness or numbness.  Pt denies polydipsia, polyuria, or low sugar episode.  Pt states overall good compliance with meds, mostly trying to follow appropriate diet, with wt overall stable,  but little exercise however.  Due for right hip surgury soon, needs ok for surgury, not sure if a form needs to be signed for dr Rhona Raider.  No new complaints, but has ongoing right knee arthritic pain as well, as well as right leg pain and numbness and right lower back pain, but pt states he was assured this was related to the right hip per ortho. Past Medical History:  Diagnosis Date  . Allergy   . Asthma    bronchitis  . Colon polyps   . Dysrhythmia    per pt many years "heart skips a beat and feels faster and they put me on meds that I've been taking ever since" 01/21/20  . GERD (gastroesophageal reflux disease)   . Hypertension   . MRSA infection 09/07/2016  . Pleurisy    hx-12/13  . Pneumonia    "double pneumonia and pleurisy around 2014" 01/21/20  . Rectal fissure   . Seasonal allergies   . Skin cancer    skin cancer melanoma "removed off my head many years ago, maybe 15 years ago" 01/21/20   Past Surgical History:  Procedure Laterality Date  . ANTERIOR CERVICAL DECOMP/DISCECTOMY FUSION N/A 01/24/2020   Procedure: ANTERIOR CERVICAL DECOMPRESSION FUSION, CERVICAL 4-5, CERVICAL 5-6, CERVIXAL 6-7;  Surgeon: Phylliss Bob, MD;  Location: [facility];  Service: Orthopedics;  Laterality: N/A;  . APPENDECTOMY  2006   lap append  . CHOLECYSTECTOMY    . [place]  2005  . HIP SURGERY Right 03/06/2018  . I & D EXTREMITY Left 07/30/2016   Procedure: left knee  arthroscopic wash out;  Surgeon: Melrose Nakayama, MD;  Location: [place];  Service: Orthopedics;  Laterality: Left;  . I & D EXTREMITY Left 08/06/2016   Procedure: IRRIGATION AND DEBRIDEMENT LEFT KNEE W/ SYNOVECTOMY;  Surgeon: Melrose Nakayama, MD;  Location: [place];  Service: Orthopedics;  Laterality: Left;  . KNEE ARTHROSCOPY  2009   left  . KNEE ARTHROSCOPY Left 06/12/2013   Procedure: LEFT KNEE ARTHROSCOPY PARTIAL MEDIAL MENISECTOMY WITH CHONDROPLASTY;  Surgeon: Hessie Dibble, MD;  Location: [place];  Service: Orthopedics;  Laterality: Left;  . KNEE SURGERY  2005   lt acl  . NASAL FRACTURE SURGERY  2012   closed reduction   . SHOULDER SURGERY  2010   left    reports that he quit smoking about 35 years ago. His smoking use included cigarettes and cigars. He has a 1.00 pack-year smoking history. He has quit using smokeless tobacco.  His smokeless tobacco use included chew. He reports current alcohol use. He reports current drug use. Frequency: 2.00 times per week. Drugs: Marijuana and Other-see comments. family history includes Arthritis in his maternal grandfather, maternal grandmother, paternal grandfather, and paternal grandmother; Bipolar disorder in his maternal grandmother; Heart attack in his father and mother; Hyperlipidemia in his brother, maternal grandfather, maternal grandmother, and mother; Hypertension in his maternal grandfather, maternal grandmother, mother, paternal  grandfather, and paternal grandmother; Skin cancer in his mother; Stroke in his maternal grandfather and mother. Allergies  Allergen Reactions  . Ace Inhibitors Cough  . Codeine Itching, Nausea Only and Rash   Current Outpatient Medications on File Prior to Visit  Medication Sig Dispense Refill  . albuterol (VENTOLIN HFA) 108 (90 Base) MCG/ACT inhaler Inhale 2 puffs into the lungs every 6 (six) hours as needed for wheezing or shortness of breath. 18 g 5  . amLODipine (NORVASC) 10 MG tablet TAKE ONE  TABLET BY MOUTH DAILY (Patient taking differently: Take 10 mg by mouth daily. ) 90 tablet 3  . amphetamine-dextroamphetamine (ADDERALL) 30 MG tablet Take 1 tablet by mouth daily. 30 tablet 0  . fexofenadine (ALLEGRA) 180 MG tablet Take 1 tablet (180 mg total) by mouth daily. 30 tablet 11  . fluticasone (FLONASE) 50 MCG/ACT nasal spray Place 2 sprays into both nostrils daily as needed for allergies.     [name] glucosamine-chondroitin 500-400 MG tablet Take 1 tablet by mouth daily.     . methocarbamol (ROBAXIN) 500 MG tablet Take 1 tablet (500 mg total) by mouth every 6 (six) hours as needed for muscle spasms. 30 tablet 2  . Multiple Vitamin (MULTIVITAMIN WITH MINERALS) TABS tablet Take 1 tablet by mouth daily.    [name] oxyCODONE-acetaminophen (PERCOCET/ROXICET) 5-325 MG tablet Take 1-2 tablets by mouth every 4 (four) hours as needed for moderate pain or severe pain. 30 tablet 0  . pantoprazole (PROTONIX) 40 MG tablet TAKE ONE TABLET BY MOUTH DAILY **NEED APPOINTMENT FOR REFILLS** 60 tablet 0  . sildenafil (VIAGRA) 100 MG tablet Take 0.5-1 tablets (50-100 mg total) by mouth daily as needed for erectile dysfunction. 10 tablet 11  . tamsulosin (FLOMAX) 0.4 MG CAPS capsule Take 1 capsule (0.4 mg total) by mouth daily. 30 capsule 11  . telmisartan (MICARDIS) 20 MG tablet TAKE ONE TABLET BY MOUTH DAILY 90 tablet 3  . triamcinolone (NASACORT) 55 MCG/ACT AERO nasal inhaler Place 2 sprays into the nose daily. 1 Inhaler 12  . triamcinolone cream (KENALOG) 0.1 % Apply 1 application topically 2 (two) times daily. 30 g 2   No current facility-administered medications on file prior to visit.   Review of Systems All otherwise neg per pt    Objective:   Physical Exam BP (!) 142/84   Pulse 85   Temp 98.3 F (36.8 C) (Oral)   Ht 5\' 9"  (1.753 m)   Wt 195 lb (88.5 kg)   SpO2 95%   BMI 28.80 kg/m  VS noted,  Constitutional: Pt appears in NAD HENT: Head: NCAT.  Right Ear: External ear normal.  Left Ear: External  ear normal.  Eyes: . Pupils are equal, round, and reactive to light. Conjunctivae and EOM are normal Nose: without d/c or deformity Neck: Neck supple. Gross normal ROM Cardiovascular: Normal rate and regular rhythm.   Pulmonary/Chest: Effort normal and breath sounds without rales or wheezing.  Abd:  Soft, NT, ND, + BS, no organomegaly Neurological: Pt is alert. At baseline orientation, motor grossly intact Skin: Skin is warm. No rashes, other new lesions, no LE edema Psychiatric: Pt behavior is normal without agitation  All otherwise neg per pt Lab Results  Component Value Date   WBC 6.5 08/29/2020   HGB 14.9 08/29/2020   HCT 44.5 08/29/2020   PLT 300 08/29/2020   GLUCOSE 119 (H) 08/29/2020   CHOL 166 08/29/2020   TRIG 92 08/29/2020   HDL 61 08/29/2020   LDLDIRECT 92.0 07/13/2019  LDLCALC 86 08/29/2020   ALT 23 08/29/2020   AST 18 08/29/2020   NA 138 08/29/2020   K 5.1 08/29/2020   CL 103 08/29/2020   CREATININE 0.87 08/29/2020   BUN 11 08/29/2020   CO2 26 08/29/2020   TSH 1.57 08/29/2020   PSA 1.0 08/29/2020   INR 0.9 08/29/2020   HGBA1C 5.3 08/29/2020      Assessment & Plan:

## 2020-09-09 NOTE — Patient Instructions (Signed)
OK for surgury as planned, and we can sign the clearance form from Dr [facility] office if needed  Please continue all other medications as before, and refills have been done if requested.  Please have the pharmacy call with any other refills you may need.  Please continue your efforts at being more active, low cholesterol diet, and weight control.  Please keep your appointments with your specialists as you may have planned

## 2020-09-09 NOTE — Assessment & Plan Note (Signed)
stable overall by history and exam, recent data reviewed with pt, and pt to continue medical treatment as before,  to f/u any worsening symptoms or concerns  

## 2020-09-11 LAB — METANEPHRINES, URINE, 24 HOUR
Metaneph Total, Ur: 753 mcg/24 h (ref 224–832)
Metanephrines, Ur: 177 mcg/24 h (ref 90–315)
Normetanephrine, 24H Ur: 576 mcg/24 h (ref 122–676)
Volume, Urine-VMAUR: 2100 mL

## 2020-09-11 LAB — CATECHOLAMINES, FRACTIONATED, URINE, 24 HOUR
Calc Total (E+NE): 136 mcg/24 h — ABNORMAL HIGH (ref 26–121)
Creatinine, Urine mg/day-CATEUR: 2.05 g/(24.h) (ref 0.50–2.15)
Dopamine 24 Hr Urine: 316 mcg/24 h (ref 52–480)
Epinephrine, 24H, Ur: 11 mcg/24 h (ref 2–24)
Norepinephrine, 24H, Ur: 125 mcg/24 h — ABNORMAL HIGH (ref 15–100)
Total Volume: 2100 mL

## 2020-09-12 ENCOUNTER — Encounter: Payer: Self-pay | Admitting: Internal Medicine

## 2020-09-15 DIAGNOSIS — M25551 Pain in right hip: Secondary | ICD-10-CM | POA: Diagnosis not present

## 2020-09-23 ENCOUNTER — Other Ambulatory Visit: Payer: Self-pay

## 2020-09-23 ENCOUNTER — Telehealth: Payer: Self-pay | Admitting: Internal Medicine

## 2020-09-23 DIAGNOSIS — I10 Essential (primary) hypertension: Secondary | ICD-10-CM

## 2020-09-23 MED ORDER — AMLODIPINE BESYLATE 10 MG PO TABS: 10.0000 mg | ORAL_TABLET | Freq: Every day | ORAL | 3 refills | Status: DC

## 2020-09-23 MED ORDER — FEXOFENADINE HCL 180 MG PO TABS: 180.0000 mg | ORAL_TABLET | Freq: Every day | ORAL | 11 refills | Status: DC

## 2020-09-23 NOTE — Telephone Encounter (Signed)
   1.Medication Requested: fexofenadine (ALLEGRA) 180 MG tablet amLODipine (NORVASC) 10 MG tablet  2. Pharmacy (Name, Street, [place]): [facility]  3. On Med List: yes  4. Last Visit with PCP: 09/09/20  5. Next visit date with PCP:   Agent: Please be advised that RX refills may take up to 3 business days. We ask that you follow-up with your pharmacy.

## 2020-09-23 NOTE — Telephone Encounter (Signed)
Sent to pt pharmacy  

## 2020-09-25 DIAGNOSIS — M1611 Unilateral primary osteoarthritis, right hip: Secondary | ICD-10-CM | POA: Diagnosis not present

## 2020-10-28 DIAGNOSIS — M25562 Pain in left knee: Secondary | ICD-10-CM | POA: Diagnosis not present

## 2020-11-05 DIAGNOSIS — M25562 Pain in left knee: Secondary | ICD-10-CM | POA: Diagnosis not present

## 2020-11-05 DIAGNOSIS — M79644 Pain in right finger(s): Secondary | ICD-10-CM | POA: Diagnosis not present

## 2020-11-12 DIAGNOSIS — M25562 Pain in left knee: Secondary | ICD-10-CM | POA: Diagnosis not present

## 2020-11-14 ENCOUNTER — Telehealth: Payer: Self-pay | Admitting: Internal Medicine

## 2020-11-14 ENCOUNTER — Other Ambulatory Visit: Payer: Self-pay

## 2020-11-14 MED ORDER — TELMISARTAN 20 MG PO TABS: ORAL_TABLET | ORAL | 3 refills | Status: DC

## 2020-11-14 NOTE — Telephone Encounter (Signed)
1.Medication Requested: telmisartan (MICARDIS) 20 MG tablet 2. Pharmacy (Name, Street, [facility]): [place], [address] [place] Phone:  [phone]  Fax:  [phone]      3. On Med List: Yes 4. Last Visit with PCP: 09.14.21 5. Next visit date with PCP: N/A  Changing pharmacies and want to make sure it goes to the correct one .

## 2020-11-26 ENCOUNTER — Other Ambulatory Visit: Payer: Self-pay

## 2020-11-26 ENCOUNTER — Telehealth: Payer: Self-pay | Admitting: Internal Medicine

## 2020-11-26 MED ORDER — TAMSULOSIN HCL 0.4 MG PO CAPS: 0.4000 mg | ORAL_CAPSULE | Freq: Every day | ORAL | 11 refills | Status: DC

## 2020-11-26 NOTE — Telephone Encounter (Signed)
    1.Medication Requested: tamsulosin (FLOMAX) 0.4 MG CAPS capsule  2. Pharmacy (Name, Street, [facility]): [place], [place]  3. On Med List: yes   4. Last Visit with PCP: 09/09/20  5. Next visit date with PCP: n/a   Agent: Please be advised that RX refills may take up to 3 business days. We ask that you follow-up with your pharmacy.

## 2020-12-11 DIAGNOSIS — M1812 Unilateral primary osteoarthritis of first carpometacarpal joint, left hand: Secondary | ICD-10-CM | POA: Diagnosis not present

## 2021-01-05 ENCOUNTER — Other Ambulatory Visit: Payer: Self-pay

## 2021-01-06 ENCOUNTER — Ambulatory Visit (INDEPENDENT_AMBULATORY_CARE_PROVIDER_SITE_OTHER): Payer: 59 | Admitting: Internal Medicine

## 2021-01-06 ENCOUNTER — Encounter: Payer: Self-pay | Admitting: Internal Medicine

## 2021-01-06 VITALS — BP 138/92 | HR 71 | Temp 98.2°F | Wt 196.6 lb

## 2021-01-06 DIAGNOSIS — R739 Hyperglycemia, unspecified: Secondary | ICD-10-CM | POA: Diagnosis not present

## 2021-01-06 DIAGNOSIS — R351 Nocturia: Secondary | ICD-10-CM | POA: Diagnosis not present

## 2021-01-06 DIAGNOSIS — I10 Essential (primary) hypertension: Secondary | ICD-10-CM | POA: Diagnosis not present

## 2021-01-06 DIAGNOSIS — F988 Other specified behavioral and emotional disorders with onset usually occurring in childhood and adolescence: Secondary | ICD-10-CM | POA: Diagnosis not present

## 2021-01-06 DIAGNOSIS — E538 Deficiency of other specified B group vitamins: Secondary | ICD-10-CM

## 2021-01-06 DIAGNOSIS — Z0000 Encounter for general adult medical examination without abnormal findings: Secondary | ICD-10-CM

## 2021-01-06 DIAGNOSIS — E559 Vitamin D deficiency, unspecified: Secondary | ICD-10-CM

## 2021-01-06 MED ORDER — TAMSULOSIN HCL 0.4 MG PO CAPS: 0.4000 mg | ORAL_CAPSULE | Freq: Every day | ORAL | 3 refills | Status: AC

## 2021-01-06 MED ORDER — TELMISARTAN 40 MG PO TABS: 40.0000 mg | ORAL_TABLET | Freq: Every day | ORAL | 3 refills | Status: DC

## 2021-01-06 MED ORDER — AMPHETAMINE-DEXTROAMPHETAMINE 30 MG PO TABS
30.0000 mg | ORAL_TABLET | Freq: Every day | ORAL | 0 refills | Status: DC
Start: 2021-01-06 — End: 2021-01-15

## 2021-01-06 NOTE — Progress Notes (Signed)
Established Patient Office Visit  Subjective:  Patient ID: ALPHUS SWEE, male    DOB: May 18, 1961  Age: 60 y.o. MRN: [id number]  CC:  Chief Complaint  Patient presents with  . Medication Problem    Pt want to discuss Tamulosin.. he states he stop taking  and ADHD rx and HTN         HPI:  [name] is a 60 y.o. 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  Each UDS IS $350 on top of that for [facility] twice per year.   Wanting to know if taking flomax or allegra will cause the nausea he gets with drinking 4 ETOH drinks on the weekends.  Flomax does help with less nocturia and post void dribbling.  .        Wt Readings from Last 3 Encounters:  01/06/21 196 lb 9.6 oz (89.2 kg)  09/09/20 195 lb (88.5 kg)  07/15/20 190 lb (86.2 kg)   BP Readings from Last 3 Encounters:  01/06/21 (!) 138/92  09/09/20 (!) 142/84  07/15/20 (!) 160/90    Past Medical History:  Diagnosis Date  . Allergy   . Asthma    bronchitis  . Colon polyps   . Dysrhythmia    per pt many years "heart skips a beat and feels faster and they put me on meds that I've been taking ever since" 01/21/20  . GERD (gastroesophageal reflux disease)   . Hypertension   . MRSA infection 09/07/2016  . Pleurisy    hx-12/13  . Pneumonia    "double pneumonia and pleurisy around 2014" 01/21/20  . Rectal fissure   . Seasonal allergies   . Skin cancer    skin cancer melanoma "removed off my head many years ago, maybe 15 years ago" 01/21/20   Past Surgical History:  Procedure Laterality Date  . ANTERIOR CERVICAL DECOMP/DISCECTOMY FUSION N/A 01/24/2020   Procedure: ANTERIOR CERVICAL DECOMPRESSION FUSION, CERVICAL 4-5, CERVICAL 5-6, CERVIXAL 6-7;  Surgeon: Phylliss Bob, MD;   Location: [facility];  Service: Orthopedics;  Laterality: N/A;  . APPENDECTOMY  2006   lap append  . CHOLECYSTECTOMY    . [place]  2005  . HIP SURGERY Right 03/06/2018  . I & D EXTREMITY Left 07/30/2016   Procedure: left knee arthroscopic wash out;  Surgeon: Melrose Nakayama, MD;  Location: [facility];  Service: Orthopedics;  Laterality: Left;  . I & D EXTREMITY Left 08/06/2016   Procedure: IRRIGATION AND DEBRIDEMENT LEFT KNEE W/ SYNOVECTOMY;  Surgeon: Melrose Nakayama, MD;  Location: [facility];  Service: Orthopedics;  Laterality: Left;  . KNEE ARTHROSCOPY  2009   left  . KNEE ARTHROSCOPY Left 06/12/2013   Procedure: LEFT KNEE ARTHROSCOPY PARTIAL MEDIAL MENISECTOMY WITH CHONDROPLASTY;  Surgeon: Hessie Dibble, MD;  Location: [facility];  Service: Orthopedics;  Laterality: Left;  . KNEE SURGERY  2005   lt acl  . NASAL FRACTURE SURGERY  2012   closed reduction   . SHOULDER SURGERY  2010   left    reports that he quit smoking about 36 years ago. His smoking use included cigarettes and cigars. He has a 1.00 pack-year smoking history. He has quit using  smokeless tobacco.  His smokeless tobacco use included chew. He reports current alcohol use. He reports current drug use. Frequency: 2.00 times per week. Drugs: Marijuana and Other-see comments. family history includes Arthritis in his maternal grandfather, maternal grandmother, paternal grandfather, and paternal grandmother; Bipolar disorder in his maternal grandmother; Heart attack in his father and mother; Hyperlipidemia in his brother, maternal grandfather, maternal grandmother, and mother; Hypertension in his maternal grandfather, maternal grandmother, mother, paternal grandfather, and paternal grandmother; Skin cancer in his mother; Stroke in his maternal grandfather and mother. Allergies  Allergen Reactions  . Ace Inhibitors Cough  . Codeine Itching, Nausea Only and Rash   Current Outpatient Medications on File Prior to Visit   Medication Sig Dispense Refill  . albuterol (VENTOLIN HFA) 108 (90 Base) MCG/ACT inhaler Inhale 2 puffs into the lungs every 6 (six) hours as needed for wheezing or shortness of breath. 18 g 5  . amLODipine (NORVASC) 10 MG tablet Take 1 tablet (10 mg total) by mouth daily. 90 tablet 3  . fluticasone (FLONASE) 50 MCG/ACT nasal spray Place 2 sprays into both nostrils daily as needed for allergies.     [name] glucosamine-chondroitin 500-400 MG tablet Take 1 tablet by mouth daily.     . methocarbamol (ROBAXIN) 500 MG tablet Take 1 tablet (500 mg total) by mouth every 6 (six) hours as needed for muscle spasms. 30 tablet 2  . Multiple Vitamin (MULTIVITAMIN WITH MINERALS) TABS tablet Take 1 tablet by mouth daily.    . pantoprazole (PROTONIX) 40 MG tablet TAKE ONE TABLET BY MOUTH DAILY **NEED APPOINTMENT FOR REFILLS** 60 tablet 0  . sildenafil (VIAGRA) 100 MG tablet Take 0.5-1 tablets (50-100 mg total) by mouth daily as needed for erectile dysfunction. 10 tablet 11  . triamcinolone (NASACORT) 55 MCG/ACT AERO nasal inhaler Place 2 sprays into the nose daily. 1 Inhaler 12  . triamcinolone cream (KENALOG) 0.1 % Apply 1 application topically 2 (two) times daily. 30 g 2  . fexofenadine (ALLEGRA) 180 MG tablet Take 1 tablet (180 mg total) by mouth daily. (Patient not taking: Reported on 01/06/2021) 30 tablet 11   No current facility-administered medications on file prior to visit.        ROS:  All others reviewed and negative.  Objective        PE:  BP (!) 138/92 (BP Location: Left Arm)   Pulse 71   Temp 98.2 F (36.8 C) (Oral)   Wt 196 lb 9.6 oz (89.2 kg)   SpO2 99%   BMI 29.03 kg/m                 Constitutional: Pt appears in NAD               HENT: Head: NCAT.                Right Ear: External ear normal.                 Left Ear: External ear normal.                Eyes: . Pupils are equal, round, and reactive to light. Conjunctivae and EOM are normal               Nose: without d/c or  deformity               Neck: Neck supple. Gross normal ROM               Cardiovascular: Normal  rate and regular rhythm.                 Pulmonary/Chest: Effort normal and breath sounds without rales or wheezing.                Abd:  Soft, NT, ND, + BS, no organomegaly               Neurological: Pt is alert. At baseline orientation, motor grossly intact               Skin: Skin is warm. No rashes, no other new lesions, LE edema - none               Psychiatric: Pt behavior is normal without agitation   Assessment/Plan:  [name] is a 60 y.o. White or Caucasian [1] male with  has a past medical history of Allergy, Asthma, Colon polyps, Dysrhythmia, GERD (gastroesophageal reflux disease), Hypertension, MRSA infection (09/07/2016), Pleurisy, Pneumonia, Rectal fissure, Seasonal allergies, and Skin cancer.   Assessment Plan  See problem oriented assessment and plan Labs reviewed for each problem: Lab Results  Component Value Date   WBC 6.5 08/29/2020   HGB 14.9 08/29/2020   HCT 44.5 08/29/2020   PLT 300 08/29/2020   GLUCOSE 119 (H) 08/29/2020   CHOL 166 08/29/2020   TRIG 92 08/29/2020   HDL 61 08/29/2020   LDLDIRECT 92.0 07/13/2019   LDLCALC 86 08/29/2020   ALT 23 08/29/2020   AST 18 08/29/2020   NA 138 08/29/2020   K 5.1 08/29/2020   CL 103 08/29/2020   CREATININE 0.87 08/29/2020   BUN 11 08/29/2020   CO2 26 08/29/2020   TSH 1.57 08/29/2020   PSA 1.0 08/29/2020   INR 0.9 08/29/2020   HGBA1C 5.3 08/29/2020    Micro: none  Cardiac tracings I have personally interpreted today:  none  Pertinent Radiological findings (summarize): none    Health Maintenance Due  Topic Date Due  . COVID-19 Vaccine (2 - Booster for Janssen series) 09/04/2020    There are no preventive care reminders to display for this patient.  Lab Results  Component Value Date   TSH 1.57 08/29/2020   Lab Results  Component Value Date   WBC 6.5 08/29/2020   HGB 14.9 08/29/2020   HCT 44.5  08/29/2020   MCV 90.1 08/29/2020   PLT 300 08/29/2020   Lab Results  Component Value Date   NA 138 08/29/2020   K 5.1 08/29/2020   CO2 26 08/29/2020   GLUCOSE 119 (H) 08/29/2020   BUN 11 08/29/2020   CREATININE 0.87 08/29/2020   BILITOT 0.4 08/29/2020   ALKPHOS 52 01/21/2020   AST 18 08/29/2020   ALT 23 08/29/2020   PROT 6.8 08/29/2020   ALBUMIN 4.0 01/21/2020   CALCIUM 9.4 08/29/2020   ANIONGAP 11 01/21/2020   GFR 76.53 07/13/2019   Lab Results  Component Value Date   CHOL 166 08/29/2020   Lab Results  Component Value Date   HDL 61 08/29/2020   Lab Results  Component Value Date   LDLCALC 86 08/29/2020   Lab Results  Component Value Date   TRIG 92 08/29/2020   Lab Results  Component Value Date   CHOLHDL 2.7 08/29/2020   Lab Results  Component Value Date   HGBA1C 5.3 08/29/2020      Assessment & Plan:   Problem List Items Addressed This Visit      High   ADD (attention deficit disorder)  Stable, to continue adderall,  to f/u any worsening symptoms or concerns        Medium   Nocturia    With BPH - pt to restart flomax, consider urology f/u      Hyperglycemia    Lab Results  Component Value Date   HGBA1C 5.3 08/29/2020   Stable, pt to continue current medical treatment  - diet   Current Outpatient Medications (Cardiovascular):  .  amLODipine (NORVASC) 10 MG tablet, Take 1 tablet (10 mg total) by mouth daily. .  sildenafil (VIAGRA) 100 MG tablet, Take 0.5-1 tablets (50-100 mg total) by mouth daily as needed for erectile dysfunction. [name]  telmisartan (MICARDIS) 40 MG tablet, Take 1 tablet (40 mg total) by mouth daily.  Current Outpatient Medications (Respiratory):  .  albuterol (VENTOLIN HFA) 108 (90 Base) MCG/ACT inhaler, Inhale 2 puffs into the lungs every 6 (six) hours as needed for wheezing or shortness of breath. .  fluticasone (FLONASE) 50 MCG/ACT nasal spray, Place 2 sprays into both nostrils daily as needed for allergies.  [name]   triamcinolone (NASACORT) 55 MCG/ACT AERO nasal inhaler, Place 2 sprays into the nose daily. .  fexofenadine (ALLEGRA) 180 MG tablet, Take 1 tablet (180 mg total) by mouth daily. (Patient not taking: Reported on 01/06/2021)    Current Outpatient Medications (Other):  .  glucosamine-chondroitin 500-400 MG tablet, Take 1 tablet by mouth daily.  .  methocarbamol (ROBAXIN) 500 MG tablet, Take 1 tablet (500 mg total) by mouth every 6 (six) hours as needed for muscle spasms. .  Multiple Vitamin (MULTIVITAMIN WITH MINERALS) TABS tablet, Take 1 tablet by mouth daily. .  pantoprazole (PROTONIX) 40 MG tablet, TAKE ONE TABLET BY MOUTH DAILY **NEED APPOINTMENT FOR REFILLS** .  triamcinolone cream (KENALOG) 0.1 %, Apply 1 application topically 2 (two) times daily. [name]  amphetamine-dextroamphetamine (ADDERALL) 30 MG tablet, Take 1 tablet by mouth daily. .  tamsulosin (FLOMAX) 0.4 MG CAPS capsule, Take 1 capsule (0.4 mg total) by mouth daily.      Essential hypertension    BP Readings from Last 3 Encounters:  01/06/21 (!) 138/92  09/09/20 (!) 142/84  07/15/20 (!) 160/90   Uncontrolled,, pt to continue medical treatment with incresaed micardis 40 mg   Current Outpatient Medications (Cardiovascular):  .  amLODipine (NORVASC) 10 MG tablet, Take 1 tablet (10 mg total) by mouth daily. .  sildenafil (VIAGRA) 100 MG tablet, Take 0.5-1 tablets (50-100 mg total) by mouth daily as needed for erectile dysfunction. [name]  telmisartan (MICARDIS) 40 MG tablet, Take 1 tablet (40 mg total) by mouth daily.  Current Outpatient Medications (Respiratory):  .  albuterol (VENTOLIN HFA) 108 (90 Base) MCG/ACT inhaler, Inhale 2 puffs into the lungs every 6 (six) hours as needed for wheezing or shortness of breath. .  fluticasone (FLONASE) 50 MCG/ACT nasal spray, Place 2 sprays into both nostrils daily as needed for allergies.  [name]  triamcinolone (NASACORT) 55 MCG/ACT AERO nasal inhaler, Place 2 sprays into the nose daily. .   fexofenadine (ALLEGRA) 180 MG tablet, Take 1 tablet (180 mg total) by mouth daily. (Patient not taking: Reported on 01/06/2021)    Current Outpatient Medications (Other):  .  glucosamine-chondroitin 500-400 MG tablet, Take 1 tablet by mouth daily.  .  methocarbamol (ROBAXIN) 500 MG tablet, Take 1 tablet (500 mg total) by mouth every 6 (six) hours as needed for muscle spasms. .  Multiple Vitamin (MULTIVITAMIN WITH MINERALS) TABS tablet, Take 1 tablet by mouth daily. .  pantoprazole (  PROTONIX) 40 MG tablet, TAKE ONE TABLET BY MOUTH DAILY **NEED APPOINTMENT FOR REFILLS** .  triamcinolone cream (KENALOG) 0.1 %, Apply 1 application topically 2 (two) times daily. [name]  amphetamine-dextroamphetamine (ADDERALL) 30 MG tablet, Take 1 tablet by mouth daily. .  tamsulosin (FLOMAX) 0.4 MG CAPS capsule, Take 1 capsule (0.4 mg total) by mouth daily.       Relevant Medications   telmisartan (MICARDIS) 40 MG tablet      Meds ordered this encounter  Medications  . amphetamine-dextroamphetamine (ADDERALL) 30 MG tablet    Sig: Take 1 tablet by mouth daily.    Dispense:  60 tablet    Refill:  0  . tamsulosin (FLOMAX) 0.4 MG CAPS capsule    Sig: Take 1 capsule (0.4 mg total) by mouth daily.    Dispense:  90 capsule    Refill:  3  . telmisartan (MICARDIS) 40 MG tablet    Sig: Take 1 tablet (40 mg total) by mouth daily.    Dispense:  90 tablet    Refill:  3    Follow-up: Return in about 6 months (around 07/06/2021).   Cathlean Cower, MD 01/06/2021 8:21 AM [facility]

## 2021-01-06 NOTE — Patient Instructions (Signed)
Ok to increase the micardis to 40 mg per day  Ok to restart the flomax  Please continue all other medications as before, including the adderral  Please have the pharmacy call with any other refills you may need.  Please continue your efforts at being more active, low cholesterol diet, and weight control.  Please keep your appointments with your specialists as you may have planned  Please make an Appointment to return in 6 months, or sooner if needed, also with Lab Appointment for testing done 3-5 days before at the [facility] (so this is for TWO appointments - please see the scheduling desk as you leave)  Due to the ongoing Covid 19 pandemic, our lab now requires an appointment for any labs done at our office.  If you need labs done and do not have an appointment, please call our office ahead of time to schedule before presenting to the lab for your testing.

## 2021-01-11 ENCOUNTER — Encounter: Payer: Self-pay | Admitting: Internal Medicine

## 2021-01-11 NOTE — Assessment & Plan Note (Signed)
With BPH - pt to restart flomax, consider urology f/u

## 2021-01-11 NOTE — Assessment & Plan Note (Signed)
Stable, to continue adderall,  to f/u any worsening symptoms or concerns

## 2021-01-11 NOTE — Assessment & Plan Note (Signed)
BP Readings from Last 3 Encounters:  01/06/21 (!) 138/92  09/09/20 (!) 142/84  07/15/20 (!) 160/90   Uncontrolled,, pt to continue medical treatment with incresaed micardis 40 mg   Current Outpatient Medications (Cardiovascular):  .  amLODipine (NORVASC) 10 MG tablet, Take 1 tablet (10 mg total) by mouth daily. .  sildenafil (VIAGRA) 100 MG tablet, Take 0.5-1 tablets (50-100 mg total) by mouth daily as needed for erectile dysfunction. [name]  telmisartan (MICARDIS) 40 MG tablet, Take 1 tablet (40 mg total) by mouth daily.  Current Outpatient Medications (Respiratory):  .  albuterol (VENTOLIN HFA) 108 (90 Base) MCG/ACT inhaler, Inhale 2 puffs into the lungs every 6 (six) hours as needed for wheezing or shortness of breath. .  fluticasone (FLONASE) 50 MCG/ACT nasal spray, Place 2 sprays into both nostrils daily as needed for allergies.  [name]  triamcinolone (NASACORT) 55 MCG/ACT AERO nasal inhaler, Place 2 sprays into the nose daily. .  fexofenadine (ALLEGRA) 180 MG tablet, Take 1 tablet (180 mg total) by mouth daily. (Patient not taking: Reported on 01/06/2021)    Current Outpatient Medications (Other):  .  glucosamine-chondroitin 500-400 MG tablet, Take 1 tablet by mouth daily.  .  methocarbamol (ROBAXIN) 500 MG tablet, Take 1 tablet (500 mg total) by mouth every 6 (six) hours as needed for muscle spasms. .  Multiple Vitamin (MULTIVITAMIN WITH MINERALS) TABS tablet, Take 1 tablet by mouth daily. .  pantoprazole (PROTONIX) 40 MG tablet, TAKE ONE TABLET BY MOUTH DAILY **NEED APPOINTMENT FOR REFILLS** .  triamcinolone cream (KENALOG) 0.1 %, Apply 1 application topically 2 (two) times daily. [name]  amphetamine-dextroamphetamine (ADDERALL) 30 MG tablet, Take 1 tablet by mouth daily. .  tamsulosin (FLOMAX) 0.4 MG CAPS capsule, Take 1 capsule (0.4 mg total) by mouth daily.

## 2021-01-11 NOTE — Assessment & Plan Note (Signed)
Lab Results  Component Value Date   HGBA1C 5.3 08/29/2020   Stable, pt to continue current medical treatment  - diet   Current Outpatient Medications (Cardiovascular):  .  amLODipine (NORVASC) 10 MG tablet, Take 1 tablet (10 mg total) by mouth daily. .  sildenafil (VIAGRA) 100 MG tablet, Take 0.5-1 tablets (50-100 mg total) by mouth daily as needed for erectile dysfunction. [name]  telmisartan (MICARDIS) 40 MG tablet, Take 1 tablet (40 mg total) by mouth daily.  Current Outpatient Medications (Respiratory):  .  albuterol (VENTOLIN HFA) 108 (90 Base) MCG/ACT inhaler, Inhale 2 puffs into the lungs every 6 (six) hours as needed for wheezing or shortness of breath. .  fluticasone (FLONASE) 50 MCG/ACT nasal spray, Place 2 sprays into both nostrils daily as needed for allergies.  [name]  triamcinolone (NASACORT) 55 MCG/ACT AERO nasal inhaler, Place 2 sprays into the nose daily. .  fexofenadine (ALLEGRA) 180 MG tablet, Take 1 tablet (180 mg total) by mouth daily. (Patient not taking: Reported on 01/06/2021)    Current Outpatient Medications (Other):  .  glucosamine-chondroitin 500-400 MG tablet, Take 1 tablet by mouth daily.  .  methocarbamol (ROBAXIN) 500 MG tablet, Take 1 tablet (500 mg total) by mouth every 6 (six) hours as needed for muscle spasms. .  Multiple Vitamin (MULTIVITAMIN WITH MINERALS) TABS tablet, Take 1 tablet by mouth daily. .  pantoprazole (PROTONIX) 40 MG tablet, TAKE ONE TABLET BY MOUTH DAILY **NEED APPOINTMENT FOR REFILLS** .  triamcinolone cream (KENALOG) 0.1 %, Apply 1 application topically 2 (two) times daily. [name]  amphetamine-dextroamphetamine (ADDERALL) 30 MG tablet, Take 1 tablet by mouth daily. .  tamsulosin (FLOMAX) 0.4 MG CAPS capsule, Take 1 capsule (0.4 mg total) by mouth daily.

## 2021-01-15 ENCOUNTER — Telehealth: Payer: Self-pay | Admitting: Internal Medicine

## 2021-01-15 MED ORDER — AMPHETAMINE-DEXTROAMPHETAMINE 30 MG PO TABS: 30.0000 mg | ORAL_TABLET | Freq: Two times a day (BID) | ORAL | 0 refills | Status: DC

## 2021-01-15 NOTE — Telephone Encounter (Signed)
Ok this is corrected

## 2021-01-15 NOTE — Telephone Encounter (Signed)
amphetamine-dextroamphetamine (ADDERALL) 30 MG tablet Patient states his prescription is usually 2 a day with 60 pills in the bottle, but his pharmacy will only give him 30 because its written for 1 a day so he was trying to get this corrected.

## 2021-01-23 ENCOUNTER — Other Ambulatory Visit: Payer: Self-pay | Admitting: Internal Medicine

## 2021-01-23 NOTE — Telephone Encounter (Signed)
Please refill as per office routine med refill policy (all routine meds refilled for 3 mo or monthly per pt preference up to one year from last visit, then month to month grace period for 3 mo, then further med refills will have to be denied)  

## 2021-01-29 IMAGING — RF DG CERVICAL SPINE 2 OR 3 VIEWS
1 series · 2 of 2 positions shown · non-contrast
Comparison: None.

CLINICAL DATA: Cervical fusion

EXAM:
DG C-ARM 1-60 MIN; CERVICAL SPINE - 2-3 VIEW

[Series 1: run · 2 of 2 slices shown]
[im 1/2]
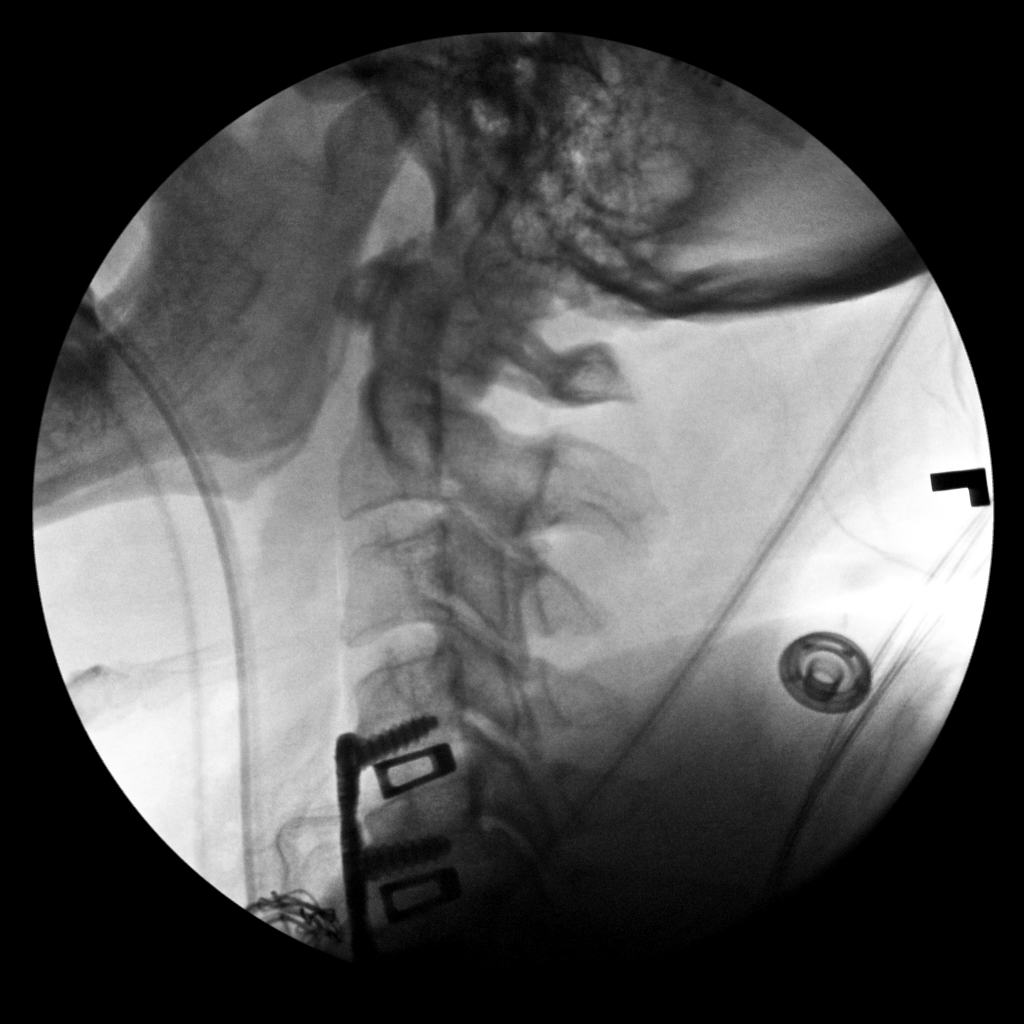
[im 2/2]
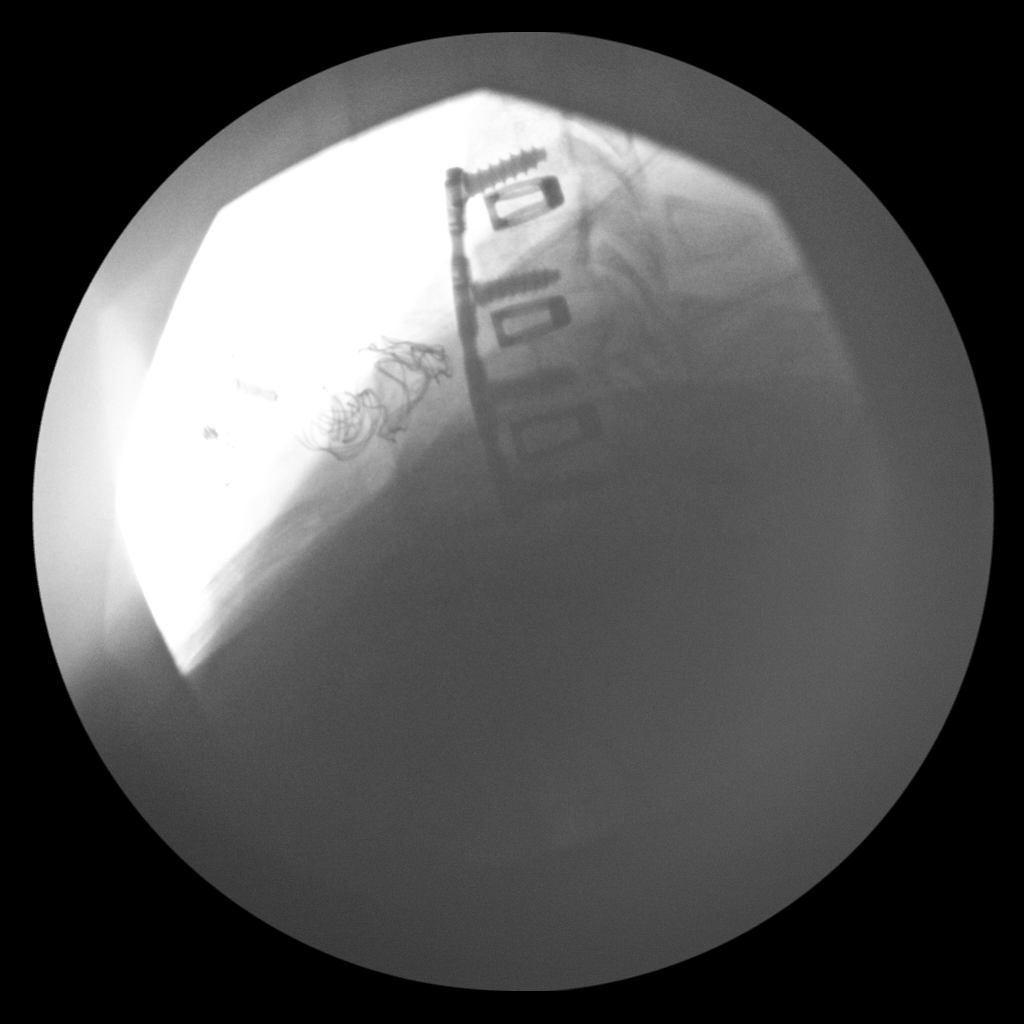

[2 of 2 positions shown; findings below may reference images not displayed]

FLUOROSCOPY TIME:  Fluoroscopy Time:  14 seconds

Radiation Exposure Index (if provided by the fluoroscopic device):
Not available

Number of Acquired Spot Images: 2
FINDINGS: Completion films demonstrate evidence of fusion from C4 to C7 with
anterior fixation.
IMPRESSION: Anterior cervical fusion from C4-C7.

## 2021-01-29 IMAGING — RF DG C-ARM 1-60 MIN
1 series · 2 of 2 positions shown · non-contrast
Comparison: None.

CLINICAL DATA: Cervical fusion

EXAM:
DG C-ARM 1-60 MIN; CERVICAL SPINE - 2-3 VIEW

[Series 1: run · 2 of 2 slices shown]
[im 1/2]
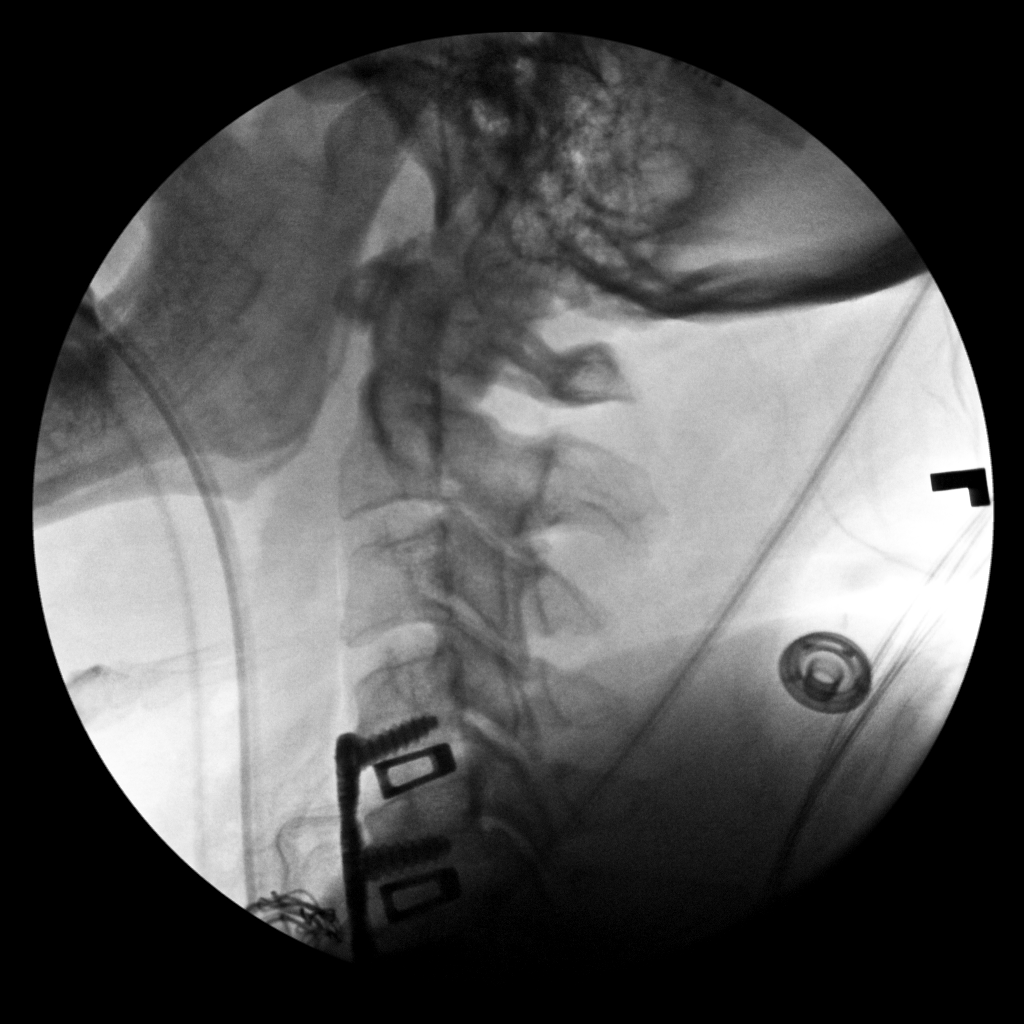
[im 2/2]
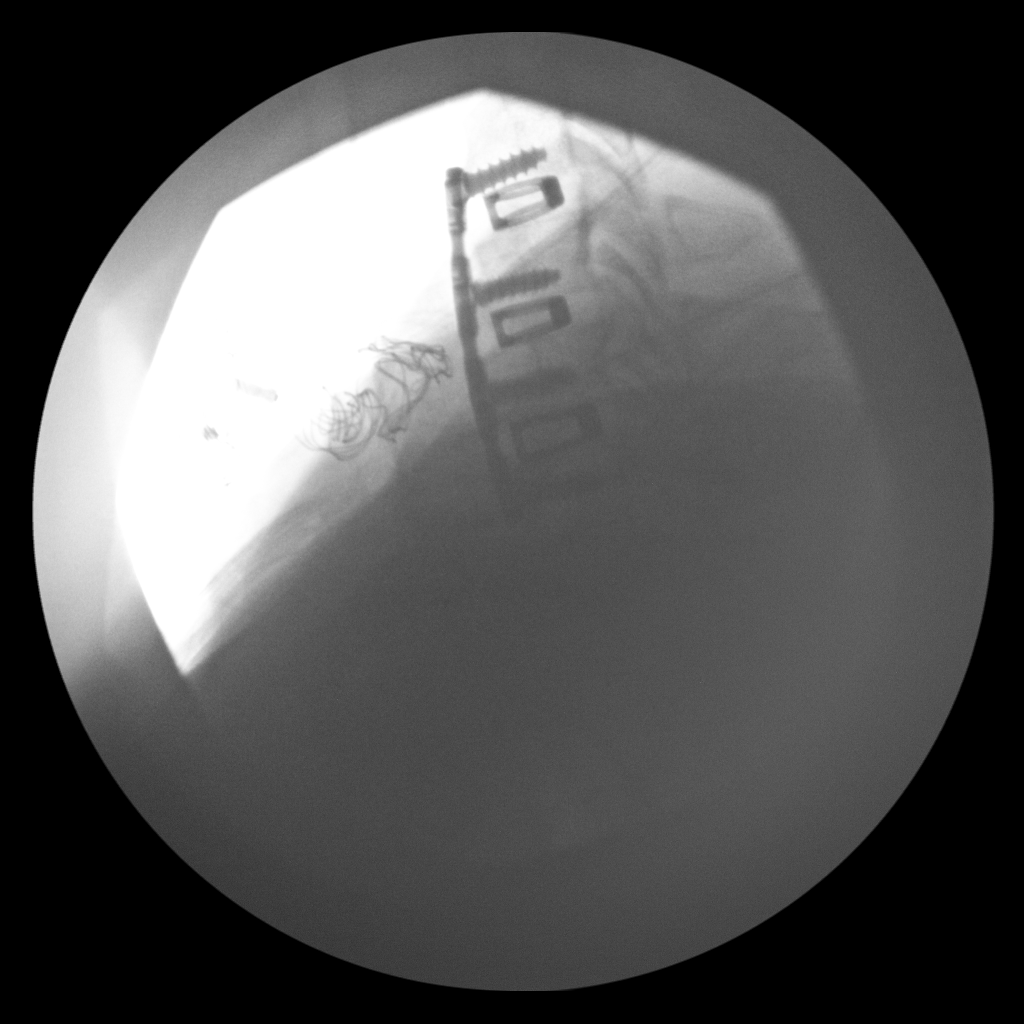

[2 of 2 positions shown; findings below may reference images not displayed]

FLUOROSCOPY TIME:  Fluoroscopy Time:  14 seconds

Radiation Exposure Index (if provided by the fluoroscopic device):
Not available

Number of Acquired Spot Images: 2
FINDINGS: Completion films demonstrate evidence of fusion from C4 to C7 with
anterior fixation.
IMPRESSION: Anterior cervical fusion from C4-C7.

## 2021-02-10 ENCOUNTER — Other Ambulatory Visit: Payer: Self-pay

## 2021-02-11 ENCOUNTER — Ambulatory Visit (INDEPENDENT_AMBULATORY_CARE_PROVIDER_SITE_OTHER): Payer: 59 | Admitting: Internal Medicine

## 2021-02-11 ENCOUNTER — Encounter: Payer: Self-pay | Admitting: Internal Medicine

## 2021-02-11 VITALS — BP 136/80 | HR 89 | Temp 98.3°F | Ht 69.0 in | Wt 198.0 lb

## 2021-02-11 DIAGNOSIS — G8929 Other chronic pain: Secondary | ICD-10-CM

## 2021-02-11 DIAGNOSIS — R413 Other amnesia: Secondary | ICD-10-CM

## 2021-02-11 DIAGNOSIS — F411 Generalized anxiety disorder: Secondary | ICD-10-CM

## 2021-02-11 DIAGNOSIS — F321 Major depressive disorder, single episode, moderate: Secondary | ICD-10-CM

## 2021-02-11 DIAGNOSIS — H9313 Tinnitus, bilateral: Secondary | ICD-10-CM

## 2021-02-11 MED ORDER — DULOXETINE HCL 30 MG PO CPEP: 30.0000 mg | ORAL_CAPSULE | Freq: Every day | ORAL | 3 refills | Status: DC
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
## 2021-02-11 NOTE — Patient Instructions (Signed)
Please take all new medication as prescribed - the cymbalta 30 mg per day  Please call in 4 wks if you would want the higher dose of 60 mg  Please call if you change your mind about the MRI or referral to psychiatry  Please continue all other medications as before, and refills have been done if requested.  Please have the pharmacy call with any other refills you may need.  Please keep your appointments with your specialists as you may have planned

## 2021-02-15 ENCOUNTER — Encounter: Payer: Self-pay | Admitting: Internal Medicine

## 2021-02-15 DIAGNOSIS — H9313 Tinnitus, bilateral: Secondary | ICD-10-CM | POA: Insufficient documentation

## 2021-02-15 DIAGNOSIS — R413 Other amnesia: Secondary | ICD-10-CM | POA: Insufficient documentation

## 2021-02-15 DIAGNOSIS — G8929 Other chronic pain: Secondary | ICD-10-CM | POA: Insufficient documentation

## 2021-02-15 NOTE — Assessment & Plan Note (Signed)
Etiology unclear, consider MRI for declines for now, so for now to use background noise at night to go to sleep

## 2021-02-15 NOTE — Assessment & Plan Note (Signed)
Mod to severe, for cymbalta 30 mg daily, consider refer to psychiatry but declines for now   - " I have seen so many"

## 2021-02-15 NOTE — Assessment & Plan Note (Signed)
Most likley c/w pseudodementia, consider MRI brain and/or neurology referral but declines for now

## 2021-02-15 NOTE — Assessment & Plan Note (Signed)
Mod worsening situational, for cymbalta 30 qd, consider 60 mg in 3 wks if not improved

## 2021-02-15 NOTE — Assessment & Plan Note (Signed)
Chronic persistent moderate, for cymbalta 30 mg qd, consider 60 mg in 3 wks if not improved

## 2021-02-16 ENCOUNTER — Telehealth: Payer: Self-pay | Admitting: Internal Medicine

## 2021-02-16 NOTE — Telephone Encounter (Signed)
Patient requesting refill for  amphetamine-dextroamphetamine (ADDERALL) 30 MG tablet  [place], [place]

## 2021-02-17 ENCOUNTER — Other Ambulatory Visit: Payer: Self-pay | Admitting: Internal Medicine

## 2021-02-17 NOTE — Telephone Encounter (Signed)
Patient requesting refill for  amphetamine-dextroamphetamine (ADDERALL) 30 MG tablet  [place], [place]

## 2021-02-19 NOTE — Telephone Encounter (Signed)
Patient called and was wondering if Adderall XR could be sent to [place], [place]. He said that he does not like amphetamine-dextroamphetamine (ADDERALL) 30 MG tablet. Please advise. The patient said that he has not had any medication in 4 days. Please call him back at [phone].

## 2021-02-19 NOTE — Telephone Encounter (Signed)
Needs OV to change any controlled substance thanks

## 2021-02-20 ENCOUNTER — Other Ambulatory Visit: Payer: Self-pay | Admitting: Internal Medicine

## 2021-02-20 NOTE — Telephone Encounter (Signed)
Please refill as per office routine med refill policy (all routine meds refilled for 3 mo or monthly per pt preference up to one year from last visit, then month to month grace period for 3 mo, then further med refills will have to be denied)  

## 2021-02-20 NOTE — Telephone Encounter (Signed)
Patient is requesting a call back.

## 2021-02-20 NOTE — Telephone Encounter (Signed)
Patient call returned.Bryce KitchenMarland KitchenMarland KitchenPatient notified that we do get 3 days to response to medication refills and patient also notified that Dr Jenny Reichmann has request to see him in office for med change. Appointment has already been scheduled.

## 2021-02-23 ENCOUNTER — Other Ambulatory Visit: Payer: Self-pay

## 2021-02-23 ENCOUNTER — Ambulatory Visit (INDEPENDENT_AMBULATORY_CARE_PROVIDER_SITE_OTHER): Payer: 59 | Admitting: Internal Medicine

## 2021-02-23 ENCOUNTER — Encounter: Payer: Self-pay | Admitting: Internal Medicine

## 2021-02-23 DIAGNOSIS — F321 Major depressive disorder, single episode, moderate: Secondary | ICD-10-CM

## 2021-02-23 DIAGNOSIS — I10 Essential (primary) hypertension: Secondary | ICD-10-CM

## 2021-02-23 DIAGNOSIS — F988 Other specified behavioral and emotional disorders with onset usually occurring in childhood and adolescence: Secondary | ICD-10-CM | POA: Diagnosis not present

## 2021-02-23 MED ORDER — AMPHETAMINE-DEXTROAMPHET ER 30 MG PO CP24: 30.0000 mg | ORAL_CAPSULE | Freq: Two times a day (BID) | ORAL | 0 refills | Status: DC

## 2021-02-23 NOTE — Progress Notes (Signed)
Patient ID: BLONG BUSK, male   DOB: [birth year], 60 y.o.   MRN: [id number]        Chief Complaint: follow up ADD and HTN       HPI:  [name] is a 60 y.o. male here with c/o ADD med not working as well since he changed from brand name adderall xr 30 bid to the adderall 30 bid generic due to insurance.  It only seems to work about 2 hrs asa well as the brand name, then unable to concentrate and get tasks done as well as before  Has his own landscaping business and falling behind.  Has checked [facility] and brand only $45 /mo with discount coverage.  Asks to change.  Also Pt denies chest pain, increased sob or doe, wheezing, orthopnea, PND, increased LE swelling, palpitations, dizziness or syncope.  BP has been < 140/90 at home, does not think med needs to be changed.  .Denies new focal neuro s/s.   Pt denies fever, wt loss, night sweats, loss of appetite, or other constitutional symptoms  Denies worsening depressive symptoms, suicidal ideation, or panic       Wt Readings from Last 3 Encounters:  02/23/21 196 lb (88.9 kg)  02/11/21 198 lb (89.8 kg)  01/06/21 196 lb 9.6 oz (89.2 kg)   BP Readings from Last 3 Encounters:  02/23/21 (!) 146/80  02/11/21 136/80  01/06/21 (!) 138/92         Past Medical History:  Diagnosis Date  . Allergy   . Asthma    bronchitis  . Colon polyps   . Dysrhythmia    per pt many years "heart skips a beat and feels faster and they put me on meds that I've been taking ever since" 01/21/20  . GERD (gastroesophageal reflux disease)   . Hypertension   . MRSA infection 09/07/2016  . Pleurisy    hx-12/13  . Pneumonia    "double pneumonia and pleurisy around 2014" 01/21/20  . Rectal fissure   . Seasonal allergies   . Skin cancer    skin cancer melanoma "removed off my head many years ago, maybe 15 years ago" 01/21/20   Past Surgical History:  Procedure Laterality Date  . ANTERIOR CERVICAL DECOMP/DISCECTOMY FUSION N/A 01/24/2020   Procedure: ANTERIOR CERVICAL  DECOMPRESSION FUSION, CERVICAL 4-5, CERVICAL 5-6, CERVIXAL 6-7;  Surgeon: Phylliss Bob, MD;  Location: [facility];  Service: Orthopedics;  Laterality: N/A;  . APPENDECTOMY  2006   lap append  . CHOLECYSTECTOMY    . [place]  2005  . HIP SURGERY Right 03/06/2018  . I & D EXTREMITY Left 07/30/2016   Procedure: left knee arthroscopic wash out;  Surgeon: Melrose Nakayama, MD;  Location: [facility];  Service: Orthopedics;  Laterality: Left;  . I & D EXTREMITY Left 08/06/2016   Procedure: IRRIGATION AND DEBRIDEMENT LEFT KNEE W/ SYNOVECTOMY;  Surgeon: Melrose Nakayama, MD;  Location: [facility];  Service: Orthopedics;  Laterality: Left;  . KNEE ARTHROSCOPY  2009   left  . KNEE ARTHROSCOPY Left 06/12/2013   Procedure: LEFT KNEE ARTHROSCOPY PARTIAL MEDIAL MENISECTOMY WITH CHONDROPLASTY;  Surgeon: Hessie Dibble, MD;  Location: [facility];  Service: Orthopedics;  Laterality: Left;  . KNEE SURGERY  2005   lt acl  . NASAL FRACTURE SURGERY  2012   closed reduction   . SHOULDER SURGERY  2010   left    reports that he quit smoking about 36 years ago. His smoking use included cigarettes  and cigars. He has a 1.00 pack-year smoking history. He has quit using smokeless tobacco.  His smokeless tobacco use included chew. He reports current alcohol use. He reports current drug use. Frequency: 2.00 times per week. Drugs: Marijuana and Other-see comments. family history includes Arthritis in his maternal grandfather, maternal grandmother, paternal grandfather, and paternal grandmother; Bipolar disorder in his maternal grandmother; Heart attack in his father and mother; Hyperlipidemia in his brother, maternal grandfather, maternal grandmother, and mother; Hypertension in his maternal grandfather, maternal grandmother, mother, paternal grandfather, and paternal grandmother; Skin cancer in his mother; Stroke in his maternal grandfather and mother. Allergies  Allergen Reactions  . Ace Inhibitors Cough  . Codeine  Itching, Nausea Only and Rash   Current Outpatient Medications on File Prior to Visit  Medication Sig Dispense Refill  . albuterol (VENTOLIN HFA) 108 (90 Base) MCG/ACT inhaler Inhale 2 puffs into the lungs every 6 (six) hours as needed for wheezing or shortness of breath. 18 g 5  . amLODipine (NORVASC) 10 MG tablet Take 1 tablet (10 mg total) by mouth daily. 90 tablet 3  . DULoxetine (CYMBALTA) 30 MG capsule Take 1 capsule (30 mg total) by mouth daily. 90 capsule 3  . fexofenadine (ALLEGRA) 180 MG tablet Take 1 tablet (180 mg total) by mouth daily. 30 tablet 11  . fluticasone (FLONASE) 50 MCG/ACT nasal spray Place 2 sprays into both nostrils daily as needed for allergies.     [name] gabapentin (NEURONTIN) 300 MG capsule     . glucosamine-chondroitin 500-400 MG tablet Take 1 tablet by mouth daily.     . meloxicam (MOBIC) 15 MG tablet Take 15 mg by mouth daily.    . methocarbamol (ROBAXIN) 500 MG tablet Take 1 tablet (500 mg total) by mouth every 6 (six) hours as needed for muscle spasms. 30 tablet 2  . Multiple Vitamin (MULTIVITAMIN WITH MINERALS) TABS tablet Take 1 tablet by mouth daily.    . pantoprazole (PROTONIX) 40 MG tablet TAKE ONE TABLET BY MOUTH DAILY. *NEED APPOINTMENT FOR REFILLS* 60 tablet 0  . sildenafil (VIAGRA) 100 MG tablet Take 0.5-1 tablets (50-100 mg total) by mouth daily as needed for erectile dysfunction. 10 tablet 11  . tamsulosin (FLOMAX) 0.4 MG CAPS capsule Take 1 capsule (0.4 mg total) by mouth daily. 90 capsule 3  . telmisartan (MICARDIS) 40 MG tablet Take 1 tablet (40 mg total) by mouth daily. 90 tablet 3  . triamcinolone (NASACORT) 55 MCG/ACT AERO nasal inhaler Place 2 sprays into the nose daily. 1 Inhaler 12  . triamcinolone cream (KENALOG) 0.1 % Apply 1 application topically 2 (two) times daily. 30 g 2   No current facility-administered medications on file prior to visit.        ROS:  All others reviewed and negative.  Objective        PE:  BP (!) 146/80   Pulse 96    Ht 5\' 9"  (1.753 m)   Wt 196 lb (88.9 kg)   SpO2 95%   BMI 28.94 kg/m                 Constitutional: Pt appears in NAD               HENT: Head: NCAT.                Right Ear: External ear normal.                 Left Ear: External ear normal.  Eyes: . Pupils are equal, round, and reactive to light. Conjunctivae and EOM are normal               Nose: without d/c or deformity               Neck: Neck supple. Gross normal ROM               Cardiovascular: Normal rate and regular rhythm.                 Pulmonary/Chest: Effort normal and breath sounds without rales or wheezing.                Abd:  Soft, NT, ND, + BS, no organomegaly               Neurological: Pt is alert. At baseline orientation, motor grossly intact               Skin: Skin is warm. No rashes, no other new lesions, LE edema - none               Psychiatric: Pt behavior is normal without agitation   Micro: none  Cardiac tracings I have personally interpreted today:  none  Pertinent Radiological findings (summarize): none   Lab Results  Component Value Date   WBC 6.5 08/29/2020   HGB 14.9 08/29/2020   HCT 44.5 08/29/2020   PLT 300 08/29/2020   GLUCOSE 119 (H) 08/29/2020   CHOL 166 08/29/2020   TRIG 92 08/29/2020   HDL 61 08/29/2020   LDLDIRECT 92.0 07/13/2019   LDLCALC 86 08/29/2020   ALT 23 08/29/2020   AST 18 08/29/2020   NA 138 08/29/2020   K 5.1 08/29/2020   CL 103 08/29/2020   CREATININE 0.87 08/29/2020   BUN 11 08/29/2020   CO2 26 08/29/2020   TSH 1.57 08/29/2020   PSA 1.0 08/29/2020   INR 0.9 08/29/2020   HGBA1C 5.3 08/29/2020   Assessment/Plan:  JAWAAN ADACHI is a 60 y.o. White or Caucasian [1] male with  has a past medical history of Allergy, Asthma, Colon polyps, Dysrhythmia, GERD (gastroesophageal reflux disease), Hypertension, MRSA infection (09/07/2016), Pleurisy, Pneumonia, Rectal fissure, Seasonal allergies, and Skin cancer.  ADD (attention deficit  disorder) Unfortunately generic fast acting is less effective, will need to change back to adderall xr 30 bid DAW and pt willing to pay for this, declines need for counseling,  to f/u any worsening symptoms or concerns  Essential hypertension BP Readings from Last 3 Encounters:  02/23/21 (!) 146/80  02/11/21 136/80  01/06/21 (!) 138/92   Stable, pt to continue medical treatment micardis, norvasc   Current Outpatient Medications (Cardiovascular):  .  amLODipine (NORVASC) 10 MG tablet, Take 1 tablet (10 mg total) by mouth daily. .  sildenafil (VIAGRA) 100 MG tablet, Take 0.5-1 tablets (50-100 mg total) by mouth daily as needed for erectile dysfunction. [name]  telmisartan (MICARDIS) 40 MG tablet, Take 1 tablet (40 mg total) by mouth daily.  Current Outpatient Medications (Respiratory):  .  albuterol (VENTOLIN HFA) 108 (90 Base) MCG/ACT inhaler, Inhale 2 puffs into the lungs every 6 (six) hours as needed for wheezing or shortness of breath. .  fexofenadine (ALLEGRA) 180 MG tablet, Take 1 tablet (180 mg total) by mouth daily. .  fluticasone (FLONASE) 50 MCG/ACT nasal spray, Place 2 sprays into both nostrils daily as needed for allergies.  [name]  triamcinolone (NASACORT) 55 MCG/ACT AERO nasal inhaler, Place 2 sprays into the nose  daily.  Current Outpatient Medications (Analgesics):  .  meloxicam (MOBIC) 15 MG tablet, Take 15 mg by mouth daily.   Current Outpatient Medications (Other):  .  amphetamine-dextroamphetamine (ADDERALL XR) 30 MG 24 hr capsule, Take 1 capsule (30 mg total) by mouth in the morning and at bedtime. .  DULoxetine (CYMBALTA) 30 MG capsule, Take 1 capsule (30 mg total) by mouth daily. [name]  gabapentin (NEURONTIN) 300 MG capsule,  .  glucosamine-chondroitin 500-400 MG tablet, Take 1 tablet by mouth daily.  .  methocarbamol (ROBAXIN) 500 MG tablet, Take 1 tablet (500 mg total) by mouth every 6 (six) hours as needed for muscle spasms. .  Multiple Vitamin (MULTIVITAMIN WITH MINERALS)  TABS tablet, Take 1 tablet by mouth daily. .  pantoprazole (PROTONIX) 40 MG tablet, TAKE ONE TABLET BY MOUTH DAILY. *NEED APPOINTMENT FOR REFILLS* .  tamsulosin (FLOMAX) 0.4 MG CAPS capsule, Take 1 capsule (0.4 mg total) by mouth daily. [name]  triamcinolone cream (KENALOG) 0.1 %, Apply 1 application topically 2 (two) times daily.   Depression Stable, declines need for psychiatry referral  Followup: Return if symptoms worsen or fail to improve.  Cathlean Cower, MD 02/23/2021 7:08 PM [facility]

## 2021-02-23 NOTE — Assessment & Plan Note (Signed)
Unfortunately generic fast acting is less effective, will need to change back to adderall xr 30 bid DAW and pt willing to pay for this, declines need for counseling,  to f/u any worsening symptoms or concerns

## 2021-02-23 NOTE — Patient Instructions (Signed)
Ok to take the adderall xr 30 bid  Please continue all other medications as before, and refills have been done if requested.  Please have the pharmacy call with any other refills you may need.  Please continue your efforts at being more active, low cholesterol diet, and weight control.  Please keep your appointments with your specialists as you may have planned

## 2021-02-23 NOTE — Assessment & Plan Note (Signed)
Stable, declines need for psychiatry referral

## 2021-02-23 NOTE — Assessment & Plan Note (Signed)
BP Readings from Last 3 Encounters:  02/23/21 (!) 146/80  02/11/21 136/80  01/06/21 (!) 138/92   Stable, pt to continue medical treatment micardis, norvasc   Current Outpatient Medications (Cardiovascular):  .  amLODipine (NORVASC) 10 MG tablet, Take 1 tablet (10 mg total) by mouth daily. .  sildenafil (VIAGRA) 100 MG tablet, Take 0.5-1 tablets (50-100 mg total) by mouth daily as needed for erectile dysfunction. [name]  telmisartan (MICARDIS) 40 MG tablet, Take 1 tablet (40 mg total) by mouth daily.  Current Outpatient Medications (Respiratory):  .  albuterol (VENTOLIN HFA) 108 (90 Base) MCG/ACT inhaler, Inhale 2 puffs into the lungs every 6 (six) hours as needed for wheezing or shortness of breath. .  fexofenadine (ALLEGRA) 180 MG tablet, Take 1 tablet (180 mg total) by mouth daily. .  fluticasone (FLONASE) 50 MCG/ACT nasal spray, Place 2 sprays into both nostrils daily as needed for allergies.  [name]  triamcinolone (NASACORT) 55 MCG/ACT AERO nasal inhaler, Place 2 sprays into the nose daily.  Current Outpatient Medications (Analgesics):  .  meloxicam (MOBIC) 15 MG tablet, Take 15 mg by mouth daily.   Current Outpatient Medications (Other):  .  amphetamine-dextroamphetamine (ADDERALL XR) 30 MG 24 hr capsule, Take 1 capsule (30 mg total) by mouth in the morning and at bedtime. .  DULoxetine (CYMBALTA) 30 MG capsule, Take 1 capsule (30 mg total) by mouth daily. [name]  gabapentin (NEURONTIN) 300 MG capsule,  .  glucosamine-chondroitin 500-400 MG tablet, Take 1 tablet by mouth daily.  .  methocarbamol (ROBAXIN) 500 MG tablet, Take 1 tablet (500 mg total) by mouth every 6 (six) hours as needed for muscle spasms. .  Multiple Vitamin (MULTIVITAMIN WITH MINERALS) TABS tablet, Take 1 tablet by mouth daily. .  pantoprazole (PROTONIX) 40 MG tablet, TAKE ONE TABLET BY MOUTH DAILY. *NEED APPOINTMENT FOR REFILLS* .  tamsulosin (FLOMAX) 0.4 MG CAPS capsule, Take 1 capsule (0.4 mg total) by mouth daily. [name]   triamcinolone cream (KENALOG) 0.1 %, Apply 1 application topically 2 (two) times daily.

## 2021-02-24 ENCOUNTER — Telehealth: Payer: Self-pay

## 2021-02-24 NOTE — Telephone Encounter (Signed)
Pharmacy contacted [place] in regards to patient having multiple adderral prescriptions at 3 different locations all written by Dr. Jenny Reichmann ..I have called the pharmacies To cancel the prescription however they have already been  Picked up by the patient.

## 2021-03-16 ENCOUNTER — Other Ambulatory Visit: Payer: Self-pay | Admitting: Internal Medicine

## 2021-03-16 MED ORDER — AMPHETAMINE-DEXTROAMPHET ER 30 MG PO CP24: 30.0000 mg | ORAL_CAPSULE | Freq: Two times a day (BID) | ORAL | 0 refills | Status: DC

## 2021-03-16 NOTE — Telephone Encounter (Signed)
[place] for refill Mar 22 2021

## 2021-03-16 NOTE — Telephone Encounter (Signed)
1.Medication Requested: amphetamine-dextroamphetamine (ADDERALL) 30 MG tablet    2. Pharmacy (Name, [place], [facility]): [place] # [place], [place]  3. On Med List: no   4. Last Visit with PCP: 2.28.22  5. Next visit date with PCP: 7.11.22  Patient is requesting the immediate  release. He said that he does not like the extended release. He wants to go back to the one that his insurance will cover. Please advise    Agent: Please be advised that RX refills may take up to 3 business days. We ask that you follow-up with your pharmacy.

## 2021-03-24 MED ORDER — AMPHETAMINE-DEXTROAMPHET ER 30 MG PO CP24: 30.0000 mg | ORAL_CAPSULE | Freq: Two times a day (BID) | ORAL | 0 refills | Status: DC

## 2021-03-24 NOTE — Addendum Note (Signed)
Addended by: Elza Rafter D on: 03/24/2021 03:17 PM   Modules accepted: Orders

## 2021-03-24 NOTE — Telephone Encounter (Signed)
Patient called and is very upset that his medication was sent to [facility]. He had requested that is be sent to the [facility]. He is requesting a call back. He can be reached at [phone].

## 2021-03-24 NOTE — Addendum Note (Signed)
Addended by: Biagio Borg on: 03/24/2021 08:35 PM   Modules accepted: Orders

## 2021-03-24 NOTE — Telephone Encounter (Signed)
Ok done erx 

## 2021-03-26 ENCOUNTER — Other Ambulatory Visit: Payer: Self-pay | Admitting: Internal Medicine

## 2021-03-26 ENCOUNTER — Telehealth: Payer: Self-pay

## 2021-03-26 ENCOUNTER — Other Ambulatory Visit: Payer: Self-pay

## 2021-03-26 ENCOUNTER — Telehealth: Payer: Self-pay | Admitting: Internal Medicine

## 2021-03-26 DIAGNOSIS — I10 Essential (primary) hypertension: Secondary | ICD-10-CM

## 2021-03-26 MED ORDER — AMLODIPINE BESYLATE 10 MG PO TABS: 10.0000 mg | ORAL_TABLET | Freq: Every day | ORAL | 3 refills | Status: DC

## 2021-03-26 MED ORDER — PANTOPRAZOLE SODIUM 40 MG PO TBEC: 40.0000 mg | DELAYED_RELEASE_TABLET | Freq: Every day | ORAL | 3 refills | Status: DC

## 2021-03-26 MED ORDER — AMPHETAMINE-DEXTROAMPHETAMINE 20 MG PO TABS: 20.0000 mg | ORAL_TABLET | Freq: Three times a day (TID) | ORAL | 0 refills | Status: DC

## 2021-03-26 NOTE — Telephone Encounter (Signed)
1.Medication Requested: amLODipine (NORVASC) 10 MG tablet    2. Pharmacy (Name, [place], [facility]): [place] # [place], [place]  3. On Med List: yes   4. Last Visit with PCP: 02/23/21  5. Next visit date with PCP: 07/06/21   Agent: Please be advised that RX refills may take up to 3 business days. We ask that you follow-up with your pharmacy.

## 2021-03-26 NOTE — Telephone Encounter (Signed)
[place] for change to adderral 20 tid - done erx

## 2021-03-27 NOTE — Telephone Encounter (Signed)
Patient notified of new prescription has been since

## 2021-03-31 ENCOUNTER — Encounter: Payer: Self-pay | Admitting: Internal Medicine

## 2021-03-31 MED ORDER — FEXOFENADINE HCL 180 MG PO TABS: 180.0000 mg | ORAL_TABLET | Freq: Every day | ORAL | 3 refills | Status: DC

## 2021-05-05 ENCOUNTER — Other Ambulatory Visit: Payer: Self-pay | Admitting: Internal Medicine

## 2021-05-05 MED ORDER — AMPHETAMINE-DEXTROAMPHETAMINE 20 MG PO TABS: 20.0000 mg | ORAL_TABLET | Freq: Three times a day (TID) | ORAL | 0 refills | Status: DC

## 2021-05-05 NOTE — Addendum Note (Signed)
Addended by: Biagio Borg on: 05/05/2021 12:35 PM   Modules accepted: Orders

## 2021-06-04 ENCOUNTER — Telehealth: Payer: Self-pay | Admitting: Internal Medicine

## 2021-06-04 MED ORDER — AMPHETAMINE-DEXTROAMPHETAMINE 20 MG PO TABS: 20.0000 mg | ORAL_TABLET | Freq: Three times a day (TID) | ORAL | 0 refills | Status: DC

## 2021-06-04 NOTE — Telephone Encounter (Signed)
Patient requesting refill for amphetamine-dextroamphetamine (ADDERALL) 20 MG tablet   Pharmacy [facility] # [place], [place]

## 2021-06-05 ENCOUNTER — Encounter: Payer: Self-pay | Admitting: Internal Medicine

## 2021-06-08 ENCOUNTER — Other Ambulatory Visit: Payer: Self-pay

## 2021-06-08 ENCOUNTER — Encounter: Payer: Self-pay | Admitting: Internal Medicine

## 2021-06-08 ENCOUNTER — Ambulatory Visit (INDEPENDENT_AMBULATORY_CARE_PROVIDER_SITE_OTHER): Payer: 59 | Admitting: Internal Medicine

## 2021-06-08 DIAGNOSIS — M6282 Rhabdomyolysis: Secondary | ICD-10-CM

## 2021-06-08 DIAGNOSIS — B001 Herpesviral vesicular dermatitis: Secondary | ICD-10-CM | POA: Diagnosis not present

## 2021-06-08 DIAGNOSIS — F5101 Primary insomnia: Secondary | ICD-10-CM | POA: Diagnosis not present

## 2021-06-08 DIAGNOSIS — M79644 Pain in right finger(s): Secondary | ICD-10-CM

## 2021-06-08 MED ORDER — TRAZODONE HCL 50 MG PO TABS: 25.0000 mg | ORAL_TABLET | Freq: Every evening | ORAL | 1 refills | Status: DC | PRN

## 2021-06-08 MED ORDER — VALACYCLOVIR HCL 1 G PO TABS: 1000.0000 mg | ORAL_TABLET | Freq: Two times a day (BID) | ORAL | 5 refills | Status: AC | PRN

## 2021-06-08 NOTE — Progress Notes (Signed)
Patient ID: [name], male   DOB: [birth year], 60 y.o.   MRN: [id number]        Chief Complaint: follow up heat exhaustion jun 1 at [facility]       HPI:  [name] is a 60 y.o. male here with c/o    Patient works outside and states June 1 after completing work he began to have severe cramps in his legs and arms. Patient states he had trouble driving. Patient endorses drinking water. Patient recieved 2L in route to hospital   Was seen and tx with IVF, CK mild elevated approx 300 without AKI by labs, and c/o unsuual cramps, dizzy, and weakness though works frequently in the heat most days.  No other recent illness such as diarrhea or n/v or fever.  Labs included wbc 12.2, hgb 12.9.  Since ED has been doing well, back to work today without symptoms.  No new complaints  Pt denies chest pain, increased sob or doe, wheezing, orthopnea, PND, increased LE swelling, palpitations, dizziness or syncope.   Pt denies polydipsia, polyuria, or new focal neuro s/s.  Pt quit taking gabapentin as he thought maybe this was somehow related to the episode.  Also has recurrent cold sores in last 3 days, asks for tx, has used valtrex previously.  Also will need new med for sleep at bedtime if not taking the gabapentin.  Also has worsening right thumb CMC pain with djd, severe, worse to any moveement, better to rest or not use, not sure how much longer he can put up with it, asks for hand surgury referral.         Wt Readings from Last 3 Encounters:  06/08/21 191 lb (86.6 kg)  02/23/21 196 lb (88.9 kg)  02/11/21 198 lb (89.8 kg)   BP Readings from Last 3 Encounters:  06/08/21 140/78  02/23/21 (!) 146/80  02/11/21 136/80         Past Medical History:  Diagnosis Date   Allergy    Asthma    bronchitis   Colon polyps    Dysrhythmia    per pt many years "heart skips a beat and feels faster and they put me on meds that I've been taking ever since" 01/21/20   GERD (gastroesophageal reflux disease)     Hypertension    MRSA infection 09/07/2016   Pleurisy    hx-12/13   Pneumonia    "double pneumonia and pleurisy around 2014" 01/21/20   Rectal fissure    Seasonal allergies    Skin cancer    skin cancer melanoma "removed off my head many years ago, maybe 15 years ago" 01/21/20   Past Surgical History:  Procedure Laterality Date   ANTERIOR CERVICAL DECOMP/DISCECTOMY FUSION N/A 01/24/2020   Procedure: ANTERIOR CERVICAL DECOMPRESSION FUSION, CERVICAL 4-5, CERVICAL 5-6, CERVIXAL 6-7;  Surgeon: Phylliss Bob, MD;  Location: [facility];  Service: Orthopedics;  Laterality: N/A;   APPENDECTOMY  2006   lap append   CHOLECYSTECTOMY     HEMORRHOID SURGERY  2005   HIP SURGERY Right 03/06/2018   I & D EXTREMITY Left 07/30/2016   Procedure: left knee arthroscopic wash out;  Surgeon: Melrose Nakayama, MD;  Location: [facility];  Service: Orthopedics;  Laterality: Left;   I & D EXTREMITY Left 08/06/2016   Procedure: IRRIGATION AND DEBRIDEMENT LEFT KNEE W/ SYNOVECTOMY;  Surgeon: Melrose Nakayama, MD;  Location: [facility];  Service: Orthopedics;  Laterality: Left;   KNEE ARTHROSCOPY  2009   left  KNEE ARTHROSCOPY Left 06/12/2013   Procedure: LEFT KNEE ARTHROSCOPY PARTIAL MEDIAL MENISECTOMY WITH CHONDROPLASTY;  Surgeon: Hessie Dibble, MD;  Location: [facility];  Service: Orthopedics;  Laterality: Left;   KNEE SURGERY  2005   lt acl   NASAL FRACTURE SURGERY  2012   closed reduction    SHOULDER SURGERY  2010   left    reports that he quit smoking about 36 years ago. His smoking use included cigarettes and cigars. He has a 1.00 pack-year smoking history. He has quit using smokeless tobacco.  His smokeless tobacco use included chew. He reports current alcohol use. He reports current drug use. Frequency: 2.00 times per week. Drugs: Marijuana and Other-see comments. family history includes Arthritis in his maternal grandfather, maternal grandmother, paternal grandfather, and paternal grandmother; Bipolar  disorder in his maternal grandmother; Heart attack in his father and mother; Hyperlipidemia in his brother, maternal grandfather, maternal grandmother, and mother; Hypertension in his maternal grandfather, maternal grandmother, mother, paternal grandfather, and paternal grandmother; Skin cancer in his mother; Stroke in his maternal grandfather and mother. Allergies  Allergen Reactions   Ace Inhibitors Cough   Codeine Itching, Nausea Only and Rash   Current Outpatient Medications on File Prior to Visit  Medication Sig Dispense Refill   albuterol (VENTOLIN HFA) 108 (90 Base) MCG/ACT inhaler Inhale 2 puffs into the lungs every 6 (six) hours as needed for wheezing or shortness of breath. 18 g 5   amLODipine (NORVASC) 10 MG tablet Take 1 tablet (10 mg total) by mouth daily. 90 tablet 3   amphetamine-dextroamphetamine (ADDERALL) 20 MG tablet Take 1 tablet (20 mg total) by mouth in the morning, at noon, and at bedtime. 90 tablet 0   DULoxetine (CYMBALTA) 30 MG capsule Take 1 capsule (30 mg total) by mouth daily. 90 capsule 3   fexofenadine (ALLEGRA) 180 MG tablet Take 1 tablet (180 mg total) by mouth daily. 90 tablet 3   fluticasone (FLONASE) 50 MCG/ACT nasal spray Place 2 sprays into both nostrils daily as needed for allergies.      glucosamine-chondroitin 500-400 MG tablet Take 1 tablet by mouth daily.      meloxicam (MOBIC) 15 MG tablet Take 15 mg by mouth daily.     methocarbamol (ROBAXIN) 500 MG tablet Take 1 tablet (500 mg total) by mouth every 6 (six) hours as needed for muscle spasms. 30 tablet 2   Multiple Vitamin (MULTIVITAMIN WITH MINERALS) TABS tablet Take 1 tablet by mouth daily.     pantoprazole (PROTONIX) 40 MG tablet Take 1 tablet (40 mg total) by mouth daily. TAKE ONE TABLET BY MOUTH DAILY 60 tablet 3   sildenafil (VIAGRA) 100 MG tablet Take 0.5-1 tablets (50-100 mg total) by mouth daily as needed for erectile dysfunction. 10 tablet 11   tamsulosin (FLOMAX) 0.4 MG CAPS capsule Take 1  capsule (0.4 mg total) by mouth daily. 90 capsule 3   telmisartan (MICARDIS) 40 MG tablet Take 1 tablet (40 mg total) by mouth daily. 90 tablet 3   triamcinolone (NASACORT) 55 MCG/ACT AERO nasal inhaler Place 2 sprays into the nose daily. 1 Inhaler 12   triamcinolone cream (KENALOG) 0.1 % Apply 1 application topically 2 (two) times daily. 30 g 2   gabapentin (NEURONTIN) 300 MG capsule  (Patient not taking: Reported on 06/08/2021)     Turmeric 400 MG CAPS Take by mouth.     No current facility-administered medications on file prior to visit.  ROS:  All others reviewed and negative.  Objective        PE:  BP 140/78 (BP Location: Left Arm, Patient Position: Sitting, Cuff Size: Large)   Pulse 91   Temp 98.5 F (36.9 C) (Oral)   Ht 5\' 9"  (1.753 m)   Wt 191 lb (86.6 kg)   SpO2 95%   BMI 28.21 kg/m                 Constitutional: Pt appears in NAD               HENT: Head: NCAT.                Right Ear: External ear normal.                 Left Ear: External ear normal.                Eyes: . Pupils are equal, round, and reactive to light. Conjunctivae and EOM are normal               Nose: without d/c or deformity               Neck: Neck supple. Gross normal ROM               Cardiovascular: Normal rate and regular rhythm.                 Pulmonary/Chest: Effort normal and breath sounds without rales or wheezing.                Abd:  Soft, NT, ND, + BS, no organomegaly               Neurological: Pt is alert. At baseline orientation, motor grossly intact               Skin: Skin is warm. No rashes, no other new lesions, LE edema - none               Psychiatric: Pt behavior is normal without agitation   Micro: none  Cardiac tracings I have personally interpreted today:  none  Pertinent Radiological findings (summarize): none   Lab Results  Component Value Date   WBC 6.5 08/29/2020   HGB 14.9 08/29/2020   HCT 44.5 08/29/2020   PLT 300 08/29/2020   GLUCOSE 119 (H)  08/29/2020   CHOL 166 08/29/2020   TRIG 92 08/29/2020   HDL 61 08/29/2020   LDLDIRECT 92.0 07/13/2019   LDLCALC 86 08/29/2020   ALT 23 08/29/2020   AST 18 08/29/2020   NA 138 08/29/2020   K 5.1 08/29/2020   CL 103 08/29/2020   CREATININE 0.87 08/29/2020   BUN 11 08/29/2020   CO2 26 08/29/2020   TSH 1.57 08/29/2020   PSA 1.0 08/29/2020   INR 0.9 08/29/2020   HGBA1C 5.3 08/29/2020   Assessment/Plan:  DYLIN BREEDEN is a 60 y.o. White or Caucasian [1] male with  has a past medical history of Allergy, Asthma, Colon polyps, Dysrhythmia, GERD (gastroesophageal reflux disease), Hypertension, MRSA infection (09/07/2016), Pleurisy, Pneumonia, Rectal fissure, Seasonal allergies, and Skin cancer.  Rhabdomyolysis Mild non traumatic likely heat related, for f/u lab today - ck, bmp, cbc,  to f/u any worsening symptoms or concerns  Recurrent cold sores Mild to mod, for valtrex prn restart,   to f/u any worsening symptoms or concerns  Pain of right thumb Mod to severe pain per pt, non truamatic, likely  end stage djd, has had similar surgury previously on the left, now for referral hand surgury  Insomnia Mild to mod persistent, for trazodone qhs prn,  to f/u any worsening symptoms or concerns  Followup: Return if symptoms worsen or fail to improve.  Cathlean Cower, MD 06/10/2021 10:15 PM [facility]

## 2021-06-08 NOTE — Patient Instructions (Signed)
Please call if you change your mind about a muscle relaxer for during the day  Please take all new medication as prescribed - the generic valtrex for cold sores as needed, and the trazodone for sleep as needed  Please continue all other medications as before, and refills have been done if requested.  Please have the pharmacy call with any other refills you may need.  Please continue your efforts at being more active, low cholesterol diet, and weight control.  You will be contacted regarding the referral for: Hand Surgury - Dr Amedeo Plenty  Please keep your appointments with your specialists as you may have planned

## 2021-06-10 ENCOUNTER — Encounter: Payer: Self-pay | Admitting: Internal Medicine

## 2021-06-10 DIAGNOSIS — G4700 Insomnia, unspecified: Secondary | ICD-10-CM | POA: Insufficient documentation

## 2021-06-10 NOTE — Assessment & Plan Note (Signed)
Mod to severe pain per pt, non truamatic, likely end stage djd, has had similar surgury previously on the left, now for referral hand surgury

## 2021-06-10 NOTE — Assessment & Plan Note (Signed)
Mild to mod persistent, for trazodone qhs prn,  to f/u any worsening symptoms or concerns

## 2021-06-10 NOTE — Assessment & Plan Note (Signed)
Mild to mod, for valtrex prn restart,   to f/u any worsening symptoms or concerns

## 2021-06-10 NOTE — Assessment & Plan Note (Addendum)
Mild non traumatic likely heat related, for f/u lab today - ck, bmp, cbc,  to f/u any worsening symptoms or concerns

## 2021-06-23 ENCOUNTER — Telehealth: Payer: Self-pay | Admitting: Emergency Medicine

## 2021-06-23 ENCOUNTER — Emergency Department: Admit: 2021-06-23 | Discharge status: Absent without leave | Payer: Self-pay

## 2021-06-23 NOTE — Telephone Encounter (Signed)
Call to pt to direct them to ED - pt answered phone, his wife was him- RN spoke to her - will take [name] to the ER - enroute now

## 2021-07-01 ENCOUNTER — Telehealth: Payer: Self-pay | Admitting: Internal Medicine

## 2021-07-01 MED ORDER — AMPHETAMINE-DEXTROAMPHETAMINE 20 MG PO TABS: 20.0000 mg | ORAL_TABLET | Freq: Three times a day (TID) | ORAL | 0 refills | Status: DC

## 2021-07-01 NOTE — Telephone Encounter (Signed)
   Patient is requesting an early refill for amphetamine-dextroamphetamine (ADDERALL) 20 MG tablet. He said that he is going out od town tomorrow afternoon. He said that he is not due for a refill until 7/10. He said that the pharmacy will need a note stating the early refill is okay. His last OV was 06/08/21 and next OV is 07/13/21. It can be sent to [facility] # [place], [place]. He is wanting to pick up medication tomorrow

## 2021-07-02 NOTE — Telephone Encounter (Signed)
Pt states he just picked up medication from pharmacy. Denies further ques/concerns at this time; is appreciative.

## 2021-07-06 ENCOUNTER — Ambulatory Visit: Payer: 59 | Admitting: Internal Medicine

## 2021-07-13 ENCOUNTER — Encounter: Payer: Self-pay | Admitting: Internal Medicine

## 2021-07-13 ENCOUNTER — Ambulatory Visit (INDEPENDENT_AMBULATORY_CARE_PROVIDER_SITE_OTHER): Payer: 59 | Admitting: Internal Medicine

## 2021-07-13 ENCOUNTER — Other Ambulatory Visit: Payer: Self-pay

## 2021-07-13 VITALS — BP 140/86 | HR 77 | Temp 98.5°F | Ht 69.0 in | Wt 192.0 lb

## 2021-07-13 DIAGNOSIS — J4520 Mild intermittent asthma, uncomplicated: Secondary | ICD-10-CM | POA: Diagnosis not present

## 2021-07-13 DIAGNOSIS — M79644 Pain in right finger(s): Secondary | ICD-10-CM

## 2021-07-13 DIAGNOSIS — R739 Hyperglycemia, unspecified: Secondary | ICD-10-CM

## 2021-07-13 DIAGNOSIS — I10 Essential (primary) hypertension: Secondary | ICD-10-CM | POA: Diagnosis not present

## 2021-07-13 MED ORDER — METHYLPREDNISOLONE ACETATE 80 MG/ML IJ SUSP
80.0000 mg | Freq: Once | INTRAMUSCULAR | Status: AC
  Administered 2021-07-13: 80 mg via INTRAMUSCULAR

## 2021-07-13 NOTE — Assessment & Plan Note (Signed)
Stable, cont current med tx, albuterol hfa prn

## 2021-07-13 NOTE — Assessment & Plan Note (Addendum)
Cont volt gel prn, chronic persistent, f/u hand surgury as referred; also for depomedrol IM 80 for trigger fingers

## 2021-07-13 NOTE — Assessment & Plan Note (Signed)
BP Readings from Last 3 Encounters:  07/13/21 140/86  06/08/21 140/78  02/23/21 (!) 146/80   Stable, pt to continue medical treatment norvasc, micardis

## 2021-07-13 NOTE — Patient Instructions (Addendum)
You had the steroid shot today  Please continue all other medications as before, and refills have been done if requested.  Please have the pharmacy call with any other refills you may need.  Please continue your efforts at being more active, low cholesterol diet, and weight control.  Please keep your appointments with your specialists as you may have planned  Please make an Appointment to return in 3 months, or sooner if needed, also with Lab Appointment for testing done 3-5 days before at the [facility] (so this is for TWO appointments - please see the scheduling desk as you leave)  Due to the ongoing Covid 19 pandemic, our lab now requires an appointment for any labs done at our office.  If you need labs done and do not have an appointment, please call our office ahead of time to schedule before presenting to the lab for your testing.

## 2021-07-13 NOTE — Assessment & Plan Note (Signed)
Lab Results  Component Value Date   HGBA1C 5.3 08/29/2020   Stable, pt to continue current medical treatment  - diet

## 2021-07-13 NOTE — Progress Notes (Signed)
Patient ID: [name], male   DOB: Jul 08, 1961, 60 y.o.   MRN: [id number]        Chief Complaint: follow up bilateral hand pain, htn, hyperglycemia, asthma       HPI:  [name] is a 60 y.o. male here with c/o persistent thumb pain with multipile trigger fingers and ongoing general pain, just miserable and has yet to be contacted by hand surgeon for appt; forced to use hands for work,  at least muscle cramps from last visit improved; Pt denies chest pain, increased sob or doe, wheezing, orthopnea, PND, increased LE swelling, palpitations, dizziness or syncope.   Pt denies polydipsia, polyuria, or new focal neuro s/s.   Pt denies fever, wt loss, night sweats, loss of appetite, or other constitutional symptoms    Wt Readings from Last 3 Encounters:  07/13/21 192 lb (87.1 kg)  06/08/21 191 lb (86.6 kg)  02/23/21 196 lb (88.9 kg)   BP Readings from Last 3 Encounters:  07/13/21 140/86  06/08/21 140/78  02/23/21 (!) 146/80         Past Medical History:  Diagnosis Date   Allergy    Asthma    bronchitis   Colon polyps    Dysrhythmia    per pt many years "heart skips a beat and feels faster and they put me on meds that I've been taking ever since" 01/21/20   GERD (gastroesophageal reflux disease)    Hypertension    MRSA infection 09/07/2016   Pleurisy    hx-12/13   Pneumonia    "double pneumonia and pleurisy around 2014" 01/21/20   Rectal fissure    Seasonal allergies    Skin cancer    skin cancer melanoma "removed off my head many years ago, maybe 15 years ago" 01/21/20   Past Surgical History:  Procedure Laterality Date   ANTERIOR CERVICAL DECOMP/DISCECTOMY FUSION N/A 01/24/2020   Procedure: ANTERIOR CERVICAL DECOMPRESSION FUSION, CERVICAL 4-5, CERVICAL 5-6, CERVIXAL 6-7;  Surgeon: Phylliss Bob, MD;  Location: [facility];  Service: Orthopedics;  Laterality: N/A;   APPENDECTOMY  2006   lap append   CHOLECYSTECTOMY     HEMORRHOID SURGERY  2005   HIP SURGERY Right 03/06/2018    I & D EXTREMITY Left 07/30/2016   Procedure: left knee arthroscopic wash out;  Surgeon: Melrose Nakayama, MD;  Location: [facility];  Service: Orthopedics;  Laterality: Left;   I & D EXTREMITY Left 08/06/2016   Procedure: IRRIGATION AND DEBRIDEMENT LEFT KNEE W/ SYNOVECTOMY;  Surgeon: Melrose Nakayama, MD;  Location: [facility];  Service: Orthopedics;  Laterality: Left;   KNEE ARTHROSCOPY  2009   left   KNEE ARTHROSCOPY Left 06/12/2013   Procedure: LEFT KNEE ARTHROSCOPY PARTIAL MEDIAL MENISECTOMY WITH CHONDROPLASTY;  Surgeon: Hessie Dibble, MD;  Location: [facility];  Service: Orthopedics;  Laterality: Left;   KNEE SURGERY  2005   lt acl   NASAL FRACTURE SURGERY  2012   closed reduction    SHOULDER SURGERY  2010   left    reports that he quit smoking about 36 years ago. His smoking use included cigarettes and cigars. He has a 1.00 pack-year smoking history. He has quit using smokeless tobacco.  His smokeless tobacco use included chew. He reports current alcohol use. He reports current drug use. Frequency: 2.00 times per week. Drugs: Marijuana and Other-see comments. family history includes Arthritis in his maternal grandfather, maternal grandmother, paternal grandfather, and paternal grandmother; Bipolar disorder in his maternal grandmother;  Heart attack in his father and mother; Hyperlipidemia in his brother, maternal grandfather, maternal grandmother, and mother; Hypertension in his maternal grandfather, maternal grandmother, mother, paternal grandfather, and paternal grandmother; Skin cancer in his mother; Stroke in his maternal grandfather and mother. Allergies  Allergen Reactions   Ace Inhibitors Cough   Codeine Itching, Nausea Only and Rash   Current Outpatient Medications on File Prior to Visit  Medication Sig Dispense Refill   albuterol (VENTOLIN HFA) 108 (90 Base) MCG/ACT inhaler Inhale 2 puffs into the lungs every 6 (six) hours as needed for wheezing or shortness of breath. 18 g 5    amLODipine (NORVASC) 10 MG tablet Take 1 tablet (10 mg total) by mouth daily. 90 tablet 3   amphetamine-dextroamphetamine (ADDERALL) 20 MG tablet Take 1 tablet (20 mg total) by mouth in the morning, at noon, and at bedtime. 90 tablet 0   DULoxetine (CYMBALTA) 30 MG capsule Take 1 capsule (30 mg total) by mouth daily. 90 capsule 3   fexofenadine (ALLEGRA) 180 MG tablet Take 1 tablet (180 mg total) by mouth daily. 90 tablet 3   fluticasone (FLONASE) 50 MCG/ACT nasal spray Place 2 sprays into both nostrils daily as needed for allergies.      gabapentin (NEURONTIN) 300 MG capsule      glucosamine-chondroitin 500-400 MG tablet Take 1 tablet by mouth daily.      meloxicam (MOBIC) 15 MG tablet Take 15 mg by mouth daily.     methocarbamol (ROBAXIN) 500 MG tablet Take 1 tablet (500 mg total) by mouth every 6 (six) hours as needed for muscle spasms. 30 tablet 2   Multiple Vitamin (MULTIVITAMIN WITH MINERALS) TABS tablet Take 1 tablet by mouth daily.     pantoprazole (PROTONIX) 40 MG tablet Take 1 tablet (40 mg total) by mouth daily. TAKE ONE TABLET BY MOUTH DAILY 60 tablet 3   sildenafil (VIAGRA) 100 MG tablet Take 0.5-1 tablets (50-100 mg total) by mouth daily as needed for erectile dysfunction. 10 tablet 11   tamsulosin (FLOMAX) 0.4 MG CAPS capsule Take 1 capsule (0.4 mg total) by mouth daily. 90 capsule 3   telmisartan (MICARDIS) 40 MG tablet Take 1 tablet (40 mg total) by mouth daily. 90 tablet 3   traZODone (DESYREL) 50 MG tablet Take 0.5-1 tablets (25-50 mg total) by mouth at bedtime as needed for sleep. 90 tablet 1   triamcinolone (NASACORT) 55 MCG/ACT AERO nasal inhaler Place 2 sprays into the nose daily. 1 Inhaler 12   triamcinolone cream (KENALOG) 0.1 % Apply 1 application topically 2 (two) times daily. 30 g 2   Turmeric 400 MG CAPS Take by mouth.     No current facility-administered medications on file prior to visit.        ROS:  All others reviewed and negative.  Objective        PE:  BP  140/86 (BP Location: Right Arm, Patient Position: Sitting, Cuff Size: Normal)   Pulse 77   Temp 98.5 F (36.9 C) (Oral)   Ht 5\' 9"  (1.753 m)   Wt 192 lb (87.1 kg)   SpO2 98%   BMI 28.35 kg/m                 Constitutional: Pt appears in NAD               HENT: Head: NCAT.                Right Ear: External ear normal.  Left Ear: External ear normal.                Eyes: . Pupils are equal, round, and reactive to light. Conjunctivae and EOM are normal               Nose: without d/c or deformity               Neck: Neck supple. Gross normal ROM               Cardiovascular: Normal rate and regular rhythm.                 Pulmonary/Chest: Effort normal and breath sounds without rales or wheezing.                Abd:  Soft, NT, ND, + BS, no organomegaly               Neurological: Pt is alert. At baseline orientation, motor grossly intact; bilat hand with severe OA change and multiple trigger fingers               Skin: Skin is warm. No rashes, no other new lesions, LE edema - none               Psychiatric: Pt behavior is normal without agitation   Micro: none  Cardiac tracings I have personally interpreted today:  none  Pertinent Radiological findings (summarize): none   Lab Results  Component Value Date   WBC 6.5 08/29/2020   HGB 14.9 08/29/2020   HCT 44.5 08/29/2020   PLT 300 08/29/2020   GLUCOSE 119 (H) 08/29/2020   CHOL 166 08/29/2020   TRIG 92 08/29/2020   HDL 61 08/29/2020   LDLDIRECT 92.0 07/13/2019   LDLCALC 86 08/29/2020   ALT 23 08/29/2020   AST 18 08/29/2020   NA 138 08/29/2020   K 5.1 08/29/2020   CL 103 08/29/2020   CREATININE 0.87 08/29/2020   BUN 11 08/29/2020   CO2 26 08/29/2020   TSH 1.57 08/29/2020   PSA 1.0 08/29/2020   INR 0.9 08/29/2020   HGBA1C 5.3 08/29/2020   Assessment/Plan:  [name] is a 60 y.o. White or Caucasian [1] male with  has a past medical history of Allergy, Asthma, Colon polyps, Dysrhythmia, GERD  (gastroesophageal reflux disease), Hypertension, MRSA infection (09/07/2016), Pleurisy, Pneumonia, Rectal fissure, Seasonal allergies, and Skin cancer.  Pain of right thumb Cont volt gel prn, chronic persistent, f/u hand surgury as referred; also for depomedrol IM 80 for trigger fingers  Mild intermittent asthma Stable, cont current med tx, albuterol hfa prn  Hyperglycemia Lab Results  Component Value Date   HGBA1C 5.3 08/29/2020   Stable, pt to continue current medical treatment  - diet   Essential hypertension BP Readings from Last 3 Encounters:  07/13/21 140/86  06/08/21 140/78  02/23/21 (!) 146/80   Stable, pt to continue medical treatment norvasc, micardis  Followup: Return in about 3 months (around 10/13/2021).  Cathlean Cower, MD 07/13/2021 10:04 PM [facility]

## 2021-08-04 ENCOUNTER — Encounter: Payer: Self-pay | Admitting: Internal Medicine

## 2021-08-05 MED ORDER — AMPHETAMINE-DEXTROAMPHETAMINE 20 MG PO TABS: 20.0000 mg | ORAL_TABLET | Freq: Three times a day (TID) | ORAL | 0 refills | Status: DC

## 2021-08-24 ENCOUNTER — Telehealth: Payer: Self-pay | Admitting: Internal Medicine

## 2021-08-24 NOTE — Telephone Encounter (Signed)
Team [facility] nurse called back,   Patient upset, refused to call 911 or go to the ED, patient hung up phone during conversation with team health nurse  Patient requesting blood work ordered  Please call the patient back at [phone]

## 2021-08-24 NOTE — Telephone Encounter (Signed)
Patient calling in with  respiratory symptoms: Shortness of breath, chest pain, palpitations or other red words send to Triage  Patient says he blacked out this morning around 5:45am .. fell & hit head   Says he feels dizzy, light headed, & doesn't have an appetite to eat  Patient says he just doesn't feel right inside & doesn't know what could have caused him to black out  Transferred call to [facility]

## 2021-08-26 NOTE — Telephone Encounter (Signed)
Sorry no, lab testing would not be appropriate - needs to go to ED and would likely need stat Head CT to f/u internal brain bleeding

## 2021-08-27 ENCOUNTER — Encounter: Payer: Self-pay | Admitting: Internal Medicine

## 2021-08-27 ENCOUNTER — Other Ambulatory Visit: Payer: Self-pay

## 2021-08-27 ENCOUNTER — Ambulatory Visit (INDEPENDENT_AMBULATORY_CARE_PROVIDER_SITE_OTHER): Payer: 59 | Admitting: Internal Medicine

## 2021-08-27 ENCOUNTER — Telehealth: Payer: Self-pay | Admitting: Internal Medicine

## 2021-08-27 VITALS — BP 170/98 | HR 80 | Temp 98.3°F | Resp 16 | Ht 69.0 in | Wt 188.0 lb

## 2021-08-27 DIAGNOSIS — S069X9A Unspecified intracranial injury with loss of consciousness of unspecified duration, initial encounter: Secondary | ICD-10-CM | POA: Insufficient documentation

## 2021-08-27 DIAGNOSIS — I10 Essential (primary) hypertension: Secondary | ICD-10-CM | POA: Diagnosis not present

## 2021-08-27 DIAGNOSIS — R270 Ataxia, unspecified: Secondary | ICD-10-CM | POA: Insufficient documentation

## 2021-08-27 DIAGNOSIS — R739 Hyperglycemia, unspecified: Secondary | ICD-10-CM | POA: Diagnosis not present

## 2021-08-27 DIAGNOSIS — R55 Syncope and collapse: Secondary | ICD-10-CM

## 2021-08-27 DIAGNOSIS — F40232 Fear of other medical care: Secondary | ICD-10-CM | POA: Insufficient documentation

## 2021-08-27 LAB — CBC WITH DIFFERENTIAL/PLATELET
Basophils Absolute: 0 10*3/uL (ref 0.0–0.1)
Basophils Relative: 0.3 % (ref 0.0–3.0)
Eosinophils Absolute: 0.1 10*3/uL (ref 0.0–0.7)
Eosinophils Relative: 1.3 % (ref 0.0–5.0)
HCT: 44 % (ref 39.0–52.0)
Hemoglobin: 15 g/dL (ref 13.0–17.0)
Lymphocytes Relative: 29 % (ref 12.0–46.0)
Lymphs Abs: 2.3 10*3/uL (ref 0.7–4.0)
MCHC: 34.1 g/dL (ref 30.0–36.0)
MCV: 88.5 fl (ref 78.0–100.0)
Monocytes Absolute: 0.5 10*3/uL (ref 0.1–1.0)
Monocytes Relative: 6.9 % (ref 3.0–12.0)
Neutro Abs: 4.9 10*3/uL (ref 1.4–7.7)
Neutrophils Relative %: 62.5 % (ref 43.0–77.0)
Platelets: 300 10*3/uL (ref 150.0–400.0)
RBC: 4.97 Mil/uL (ref 4.22–5.81)
RDW: 14.1 % (ref 11.5–15.5)
WBC: 7.8 10*3/uL (ref 4.0–10.5)

## 2021-08-27 LAB — URINALYSIS, ROUTINE W REFLEX MICROSCOPIC
Bilirubin Urine: NEGATIVE
Hgb urine dipstick: NEGATIVE
Ketones, ur: NEGATIVE
Leukocytes,Ua: NEGATIVE
Nitrite: NEGATIVE
RBC / HPF: NONE SEEN (ref 0–?)
Specific Gravity, Urine: 1.015 (ref 1.000–1.030)
Total Protein, Urine: NEGATIVE
Urine Glucose: NEGATIVE
Urobilinogen, UA: 0.2 (ref 0.0–1.0)
WBC, UA: NONE SEEN (ref 0–?)
pH: 6 (ref 5.0–8.0)

## 2021-08-27 LAB — BASIC METABOLIC PANEL
BUN: 15 mg/dL (ref 6–23)
CO2: 28 mEq/L (ref 19–32)
Calcium: 9.7 mg/dL (ref 8.4–10.5)
Chloride: 103 mEq/L (ref 96–112)
Creatinine, Ser: 0.89 mg/dL (ref 0.40–1.50)
GFR: 92.86 mL/min (ref >60.00)
Glucose, Bld: 102 mg/dL — ABNORMAL HIGH (ref 70–99)
Potassium: 4.4 mEq/L (ref 3.5–5.1)
Sodium: 137 mEq/L (ref 135–145)

## 2021-08-27 LAB — HEPATIC FUNCTION PANEL
ALT: 23 U/L (ref 0–53)
AST: 18 U/L (ref 0–37)
Albumin: 4.4 g/dL (ref 3.5–5.2)
Alkaline Phosphatase: 54 U/L (ref 39–117)
Bilirubin, Direct: 0.1 mg/dL (ref 0.0–0.3)
Total Bilirubin: 0.5 mg/dL (ref 0.2–1.2)
Total Protein: 7.5 g/dL (ref 6.0–8.3)

## 2021-08-27 LAB — HEMOGLOBIN A1C: Hgb A1c MFr Bld: 5.9 % (ref 4.6–6.5)

## 2021-08-27 LAB — TSH: TSH: 1.73 u[IU]/mL (ref 0.35–5.50)

## 2021-08-27 MED ORDER — DIAZEPAM 5 MG PO TABS: 10.0000 mg | ORAL_TABLET | Freq: Two times a day (BID) | ORAL | 0 refills | Status: AC | PRN

## 2021-08-27 MED ORDER — INDAPAMIDE 1.25 MG PO TABS: 1.2500 mg | ORAL_TABLET | Freq: Every day | ORAL | 0 refills | Status: DC

## 2021-08-27 NOTE — Telephone Encounter (Signed)
Spoke with pharmacy to provider clarification. Take the 2 tablets of Diazepam 1 hour prior to MRI.

## 2021-08-27 NOTE — Telephone Encounter (Signed)
    Pharmacy requesting clarification on instructions  RE: Diazepam Take 2 tablets (10 mg total) by mouth every 12 (twelve) hours as needed for anxiety. - Oral      Notes to Pharmacy: Take 1 hour before the MRI     Please call [facility]

## 2021-08-27 NOTE — Progress Notes (Signed)
Subjective:  Patient ID: [name], male    DOB: October 25, 1961  Age: 60 y.o. MRN: [id number]  CC: Hypertension  This visit occurred during the SARS-CoV-2 public health emergency.  Safety protocols were in place, including screening questions prior to the visit, additional usage of staff PPE, and extensive cleaning of exam room while observing appropriate contact time as indicated for disinfecting solutions.    HPI [name] presents for f/up and to establish.  He has felt poorly for the last year.  He suffers from chronic musculoskeletal pain and has been treated for depression but antidepressants were not helpful so he stopped taking them.  He takes Adderall for ADHD.  In the last year he has had 3 episodes of blacking out.  The most recent episode occurred about 3 days ago.  He says he was standing in his yard when he had the acute onset of cold sweats, dizziness, nausea, ataxia, and numbness and tingling in his upper extremities.  He says he fell and hit the left side of his scalp causing an abrasion.  He thinks he was unconscious for 10 or 11 minutes.  He did not have any post ictal phenomenon.  There is no history of seizure activity.  He has chronic unchanged neck, pain, back pain, and pain in his large joints.    Outpatient Medications Prior to Visit  Medication Sig Dispense Refill   albuterol (VENTOLIN HFA) 108 (90 Base) MCG/ACT inhaler Inhale 2 puffs into the lungs every 6 (six) hours as needed for wheezing or shortness of breath. 18 g 5   amLODipine (NORVASC) 10 MG tablet Take 1 tablet (10 mg total) by mouth daily. 90 tablet 3   fexofenadine (ALLEGRA) 180 MG tablet Take 1 tablet (180 mg total) by mouth daily. 90 tablet 3   fluticasone (FLONASE) 50 MCG/ACT nasal spray Place 2 sprays into both nostrils daily as needed for allergies.      glucosamine-chondroitin 500-400 MG tablet Take 1 tablet by mouth daily.      meloxicam (MOBIC) 15 MG tablet Take 15 mg by mouth daily.      Multiple Vitamin (MULTIVITAMIN WITH MINERALS) TABS tablet Take 1 tablet by mouth daily.     pantoprazole (PROTONIX) 40 MG tablet Take 1 tablet (40 mg total) by mouth daily. TAKE ONE TABLET BY MOUTH DAILY 60 tablet 3   sildenafil (VIAGRA) 100 MG tablet Take 0.5-1 tablets (50-100 mg total) by mouth daily as needed for erectile dysfunction. 10 tablet 11   tamsulosin (FLOMAX) 0.4 MG CAPS capsule Take 1 capsule (0.4 mg total) by mouth daily. 90 capsule 3   telmisartan (MICARDIS) 40 MG tablet Take 1 tablet (40 mg total) by mouth daily. 90 tablet 3   triamcinolone (NASACORT) 55 MCG/ACT AERO nasal inhaler Place 2 sprays into the nose daily. 1 Inhaler 12   Turmeric 400 MG CAPS Take by mouth.     amphetamine-dextroamphetamine (ADDERALL) 20 MG tablet Take 1 tablet (20 mg total) by mouth in the morning, at noon, and at bedtime. 90 tablet 0   DULoxetine (CYMBALTA) 30 MG capsule Take 1 capsule (30 mg total) by mouth daily. 90 capsule 3   gabapentin (NEURONTIN) 300 MG capsule      methocarbamol (ROBAXIN) 500 MG tablet Take 1 tablet (500 mg total) by mouth every 6 (six) hours as needed for muscle spasms. 30 tablet 2   traZODone (DESYREL) 50 MG tablet Take 0.5-1 tablets (25-50 mg total) by mouth at bedtime as needed  for sleep. 90 tablet 1   No facility-administered medications prior to visit.    ROS Review of Systems  Constitutional:  Positive for diaphoresis. Negative for chills, fatigue and fever.  HENT:  Positive for tinnitus.   Eyes:  Positive for visual disturbance (BV).  Respiratory:  Negative for cough, chest tightness, shortness of breath and wheezing.   Cardiovascular:  Negative for chest pain, palpitations and leg swelling.  Gastrointestinal:  Positive for nausea. Negative for abdominal pain, constipation, diarrhea and vomiting.  Endocrine: Negative.   Genitourinary: Negative.  Negative for difficulty urinating.  Musculoskeletal:  Positive for arthralgias, back pain and neck pain. Negative for  joint swelling, myalgias and neck stiffness.  Skin: Negative.   Neurological:  Positive for dizziness, syncope and numbness. Negative for tremors, seizures, facial asymmetry, speech difficulty, weakness, light-headedness and headaches.  Hematological:  Negative for adenopathy. Does not bruise/bleed easily.  Psychiatric/Behavioral:  Positive for dysphoric mood. Negative for agitation, sleep disturbance and suicidal ideas. The patient is not nervous/anxious.    Objective:  BP (!) 170/98 (BP Location: Left Arm, Patient Position: Sitting, Cuff Size: Large)   Pulse 80   Temp 98.3 F (36.8 C) (Oral)   Resp 16   Ht '5\' 9"'$  (1.753 m)   Wt 188 lb (85.3 kg)   SpO2 95%   BMI 27.76 kg/m   BP Readings from Last 3 Encounters:  08/27/21 (!) 170/98  07/13/21 140/86  06/08/21 140/78    Wt Readings from Last 3 Encounters:  08/27/21 188 lb (85.3 kg)  07/13/21 192 lb (87.1 kg)  06/08/21 191 lb (86.6 kg)    Physical Exam Vitals reviewed.  Constitutional:      Appearance: Normal appearance. He is not ill-appearing.  HENT:     Head: Abrasion present. No raccoon eyes, Battle's sign, contusion or laceration.      Right Ear: No hemotympanum.     Left Ear: No hemotympanum.     Mouth/Throat:     Mouth: Mucous membranes are moist.  Eyes:     General: No scleral icterus.    Extraocular Movements: Extraocular movements intact.     Pupils: Pupils are equal, round, and reactive to light.  Cardiovascular:     Rate and Rhythm: Normal rate and regular rhythm.     Heart sounds: Normal heart sounds, S1 normal and S2 normal.    No gallop.     Comments: EKG- NSR, 68 bpm Normal EKG Pulmonary:     Breath sounds: No stridor. No wheezing, rhonchi or rales.  Abdominal:     General: Abdomen is flat.     Palpations: There is no mass.     Tenderness: There is no abdominal tenderness. There is no guarding.     Hernia: No hernia is present.  Musculoskeletal:     Cervical back: Neck supple.     Right lower  leg: No edema.     Left lower leg: No edema.  Lymphadenopathy:     Cervical: No cervical adenopathy.  Skin:    General: Skin is warm and dry.     Findings: No rash.  Neurological:     General: No focal deficit present.     Mental Status: He is alert and oriented to person, place, and time. Mental status is at baseline.     Cranial Nerves: No cranial nerve deficit.     Sensory: No sensory deficit.     Motor: Motor function is intact. No weakness.     Coordination: Romberg sign  positive. Coordination normal. Finger-Nose-Finger Test normal. Rapid alternating movements normal.     Gait: Gait is intact. Gait normal.     Deep Tendon Reflexes: Reflexes normal. Babinski sign absent on the right side. Babinski sign absent on the left side.     Reflex Scores:      Tricep reflexes are 1+ on the right side and 2+ on the left side.      Bicep reflexes are 1+ on the right side.      Brachioradialis reflexes are 1+ on the right side and 2+ on the left side.      Patellar reflexes are 2+ on the right side and 2+ on the left side.      Achilles reflexes are 1+ on the right side and 1+ on the left side. Psychiatric:        Attention and Perception: Perception normal. He is inattentive.        Mood and Affect: Mood is not anxious or depressed. Affect is angry. Affect is not tearful.        Speech: Speech normal. Speech is not delayed or tangential.        Behavior: Behavior normal. Behavior is cooperative.        Thought Content: Thought content normal. Thought content is not paranoid or delusional. Thought content does not include homicidal or suicidal ideation.        Cognition and Memory: Cognition and memory normal.        Judgment: Judgment normal.    Lab Results  Component Value Date   WBC 7.8 08/27/2021   HGB 15.0 08/27/2021   HCT 44.0 08/27/2021   PLT 300.0 08/27/2021   GLUCOSE 102 (H) 08/27/2021   CHOL 166 08/29/2020   TRIG 92 08/29/2020   HDL 61 08/29/2020   LDLDIRECT 92.0 07/13/2019    LDLCALC 86 08/29/2020   ALT 23 08/27/2021   AST 18 08/27/2021   NA 137 08/27/2021   K 4.4 08/27/2021   CL 103 08/27/2021   CREATININE 0.89 08/27/2021   BUN 15 08/27/2021   CO2 28 08/27/2021   TSH 1.73 08/27/2021   PSA 1.0 08/29/2020   INR 0.9 08/29/2020   HGBA1C 5.9 08/27/2021    No results found.  Assessment & Plan:   Tajohn was seen today for hypertension.  Diagnoses and all orders for this visit:  Ataxia- I recommended an MRI of the brain with and without contrast to screen for mass, bleed, neurodegenerative disorder, atrophy, CVA, and demyelination. -     MR Brain W Wo Contrast; Future -     Urine drugs of abuse scrn w alc, routine (Ref Lab); Future -     Urine drugs of abuse scrn w alc, routine (Ref Lab)  Syncope, unspecified syncope type-recommended that he stop taking Adderall.  He does not have any cardiovascular symptoms.  He has neurological symptoms so I recommended an MRI of the brain.  If his symptoms continue then may need to consider an EEG. -     EKG 12-Lead -     Urine drugs of abuse scrn w alc, routine (Ref Lab); Future -     Urine drugs of abuse scrn w alc, routine (Ref Lab)  Essential hypertension- His blood pressure is not adequately well controlled.  Will check labs to screen for secondary causes and endorgan damage.  I recommended that he stop taking Adderall and start taking indapamide. -     CBC with Differential/Platelet; Future -  Basic metabolic panel; Future -     Urinalysis, Routine w reflex microscopic; Future -     TSH; Future -     Hepatic function panel; Future -     EKG 12-Lead -     Urine drugs of abuse scrn w alc, routine (Ref Lab); Future -     indapamide (LOZOL) 1.25 MG tablet; Take 1 tablet (1.25 mg total) by mouth daily. -     Aldosterone + renin activity w/ ratio; Future -     Aldosterone + renin activity w/ ratio -     Urine drugs of abuse scrn w alc, routine (Ref Lab) -     Hepatic function panel -     TSH -      Urinalysis, Routine w reflex microscopic -     Basic metabolic panel -     CBC with Differential/Platelet  Head injury with loss of consciousness ([place])- See above. -     MR Brain W Wo Contrast; Future -     Urine drugs of abuse scrn w alc, routine (Ref Lab); Future -     Urine drugs of abuse scrn w alc, routine (Ref Lab)  Chronic hyperglycemia -     Hemoglobin A1c; Future -     Urine drugs of abuse scrn w alc, routine (Ref Lab); Future -     Urine drugs of abuse scrn w alc, routine (Ref Lab) -     Hemoglobin A1c  Fear associated with healthcare -     diazepam (VALIUM) 5 MG tablet; Take 2 tablets (10 mg total) by mouth every 12 (twelve) hours as needed for anxiety.  I have discontinued Treveon K. Emory "Keith"'s methocarbamol, gabapentin, DULoxetine, traZODone, and amphetamine-dextroamphetamine. I am also having him start on indapamide and diazepam. Additionally, I am having him maintain his fluticasone, glucosamine-chondroitin, sildenafil, albuterol, multivitamin with minerals, triamcinolone, tamsulosin, telmisartan, meloxicam, pantoprazole, amLODipine, fexofenadine, and Turmeric.  Meds ordered this encounter  Medications   indapamide (LOZOL) 1.25 MG tablet    Sig: Take 1 tablet (1.25 mg total) by mouth daily.    Dispense:  90 tablet    Refill:  0   diazepam (VALIUM) 5 MG tablet    Sig: Take 2 tablets (10 mg total) by mouth every 12 (twelve) hours as needed for anxiety.    Dispense:  2 tablet    Refill:  0    Take 1 hour before the MRI      Follow-up: Return in about 3 weeks (around 09/17/2021).  Scarlette Calico, MD

## 2021-08-27 NOTE — Patient Instructions (Signed)

## 2021-09-03 ENCOUNTER — Encounter: Payer: Self-pay | Admitting: Internal Medicine

## 2021-09-04 LAB — ALDOSTERONE + RENIN ACTIVITY W/ RATIO
ALDO / PRA Ratio: 1.6 Ratio (ref 0.9–28.9)
Aldosterone: 6 ng/dL
Renin Activity: 3.7 ng/mL/h (ref 0.25–5.82)

## 2021-09-04 MED ORDER — AMPHETAMINE-DEXTROAMPHETAMINE 20 MG PO TABS: 20.0000 mg | ORAL_TABLET | Freq: Three times a day (TID) | ORAL | 0 refills | Status: DC

## 2021-09-06 LAB — URINE DRUGS OF ABUSE SCREEN W ALC, ROUTINE (REF LAB)
Amphetamines, Urine: NEGATIVE ng/mL
Barbiturate Quant, Ur: NEGATIVE ng/mL
Benzodiazepine Quant, Ur: NEGATIVE ng/mL
Cocaine (Metab.): NEGATIVE ng/mL
Ethanol, Urine: NEGATIVE %
Methadone Screen, Urine: NEGATIVE ng/mL
Opiate Quant, Ur: NEGATIVE ng/mL
PCP Quant, Ur: NEGATIVE ng/mL
Propoxyphene: NEGATIVE ng/mL

## 2021-09-06 LAB — PANEL 799049
CARBOXY THC GC/MS CONF: >750 ng/mL
Cannabinoid GC/MS, Ur: POSITIVE — AB

## 2021-09-14 ENCOUNTER — Ambulatory Visit
Admission: RE | Admit: 2021-09-14 | Discharge: 2021-09-14 | Disposition: A | Discharge status: Home / Self Care | Payer: 59 | Source: Ambulatory Visit | Attending: Internal Medicine | Admitting: Internal Medicine

## 2021-09-14 ENCOUNTER — Other Ambulatory Visit: Payer: Self-pay

## 2021-09-14 DIAGNOSIS — S069X9A Unspecified intracranial injury with loss of consciousness of unspecified duration, initial encounter: Secondary | ICD-10-CM

## 2021-09-14 DIAGNOSIS — R270 Ataxia, unspecified: Secondary | ICD-10-CM

## 2021-09-14 MED ORDER — GADOBENATE DIMEGLUMINE 529 MG/ML IV SOLN
17.0000 mL | Freq: Once | INTRAVENOUS | Status: AC | PRN
  Administered 2021-09-14: 17 mL via INTRAVENOUS

## 2021-10-06 ENCOUNTER — Encounter: Payer: Self-pay | Admitting: Internal Medicine

## 2021-10-08 MED ORDER — AMPHETAMINE-DEXTROAMPHETAMINE 20 MG PO TABS: 20.0000 mg | ORAL_TABLET | Freq: Three times a day (TID) | ORAL | 0 refills | Status: DC

## 2021-10-08 NOTE — Telephone Encounter (Signed)
Patient is requesting a refill of the following medications: Requested Prescriptions   Pending Prescriptions Disp Refills   amphetamine-dextroamphetamine (ADDERALL) 20 MG tablet 90 tablet 0    Sig: Take 1 tablet (20 mg total) by mouth 3 (three) times daily.    Date of patient request: 10/06/21  Last office visit: 08/27/21 Date of last refill: 09/04/21  Last refill amount: 90,0 Follow up time period per chart: 10/15/21

## 2021-10-12 ENCOUNTER — Other Ambulatory Visit: Payer: Self-pay

## 2021-10-12 ENCOUNTER — Other Ambulatory Visit (INDEPENDENT_AMBULATORY_CARE_PROVIDER_SITE_OTHER): Payer: 59

## 2021-10-12 DIAGNOSIS — Z0000 Encounter for general adult medical examination without abnormal findings: Secondary | ICD-10-CM

## 2021-10-12 DIAGNOSIS — M79644 Pain in right finger(s): Secondary | ICD-10-CM

## 2021-10-12 DIAGNOSIS — E559 Vitamin D deficiency, unspecified: Secondary | ICD-10-CM | POA: Diagnosis not present

## 2021-10-12 DIAGNOSIS — B001 Herpesviral vesicular dermatitis: Secondary | ICD-10-CM | POA: Diagnosis not present

## 2021-10-12 DIAGNOSIS — E538 Deficiency of other specified B group vitamins: Secondary | ICD-10-CM

## 2021-10-12 DIAGNOSIS — Z125 Encounter for screening for malignant neoplasm of prostate: Secondary | ICD-10-CM

## 2021-10-12 DIAGNOSIS — I10 Essential (primary) hypertension: Secondary | ICD-10-CM | POA: Diagnosis not present

## 2021-10-12 DIAGNOSIS — M6282 Rhabdomyolysis: Secondary | ICD-10-CM

## 2021-10-12 LAB — CK: Total CK: 212 U/L (ref 7–232)

## 2021-10-12 LAB — LIPID PANEL
Cholesterol: 175 mg/dL (ref 0–200)
HDL: 53.5 mg/dL (ref >39.00)
NonHDL: 121.32
Total CHOL/HDL Ratio: 3
Triglycerides: 204 mg/dL — ABNORMAL HIGH (ref 0.0–149.0)
VLDL: 40.8 mg/dL — ABNORMAL HIGH (ref 0.0–40.0)

## 2021-10-12 LAB — VITAMIN D 25 HYDROXY (VIT D DEFICIENCY, FRACTURES): VITD: 31.47 ng/mL (ref 30.00–100.00)

## 2021-10-12 LAB — LDL CHOLESTEROL, DIRECT: Direct LDL: 101 mg/dL

## 2021-10-12 LAB — VITAMIN B12: Vitamin B-12: 369 pg/mL (ref 211–911)

## 2021-10-12 LAB — PSA: PSA: 2.21 ng/mL (ref 0.10–4.00)

## 2021-10-15 ENCOUNTER — Other Ambulatory Visit: Payer: Self-pay

## 2021-10-15 ENCOUNTER — Encounter: Payer: Self-pay | Admitting: Internal Medicine

## 2021-10-15 ENCOUNTER — Ambulatory Visit (INDEPENDENT_AMBULATORY_CARE_PROVIDER_SITE_OTHER): Payer: 59 | Admitting: Internal Medicine

## 2021-10-15 VITALS — BP 150/82 | HR 84 | Ht 69.0 in | Wt 193.0 lb

## 2021-10-15 DIAGNOSIS — R739 Hyperglycemia, unspecified: Secondary | ICD-10-CM

## 2021-10-15 DIAGNOSIS — E559 Vitamin D deficiency, unspecified: Secondary | ICD-10-CM

## 2021-10-15 DIAGNOSIS — J301 Allergic rhinitis due to pollen: Secondary | ICD-10-CM | POA: Diagnosis not present

## 2021-10-15 DIAGNOSIS — R350 Frequency of micturition: Secondary | ICD-10-CM | POA: Diagnosis not present

## 2021-10-15 DIAGNOSIS — E538 Deficiency of other specified B group vitamins: Secondary | ICD-10-CM

## 2021-10-15 DIAGNOSIS — Z0000 Encounter for general adult medical examination without abnormal findings: Secondary | ICD-10-CM

## 2021-10-15 DIAGNOSIS — I10 Essential (primary) hypertension: Secondary | ICD-10-CM | POA: Diagnosis not present

## 2021-10-15 MED ORDER — METHYLPREDNISOLONE ACETATE 80 MG/ML IJ SUSP
80.0000 mg | Freq: Once | INTRAMUSCULAR | Status: AC
  Administered 2021-10-15: 80 mg via INTRAMUSCULAR

## 2021-10-15 MED ORDER — TRIAMCINOLONE ACETONIDE 55 MCG/ACT NA AERO: 2.0000 | INHALATION_SPRAY | Freq: Every day | NASAL | 12 refills | Status: AC

## 2021-10-15 MED ORDER — SOLIFENACIN SUCCINATE 5 MG PO TABS
5.0000 mg | ORAL_TABLET | Freq: Every day | ORAL | 3 refills | Status: AC
Start: 2021-10-15 — End: ?

## 2021-10-15 MED ORDER — TELMISARTAN 80 MG PO TABS: 80.0000 mg | ORAL_TABLET | Freq: Every day | ORAL | 3 refills | Status: DC

## 2021-10-15 MED ORDER — FEXOFENADINE HCL 180 MG PO TABS: 180.0000 mg | ORAL_TABLET | Freq: Every day | ORAL | 3 refills | Status: AC

## 2021-10-15 NOTE — Patient Instructions (Addendum)
Please take OTC Vitamin D3 at 2000 units per day, indefinitely  Please take all new medication as prescribed - the generic vesicare for bladder  Ok to stop the lozol (indpamide)  Ok to increase the micardis to 80 mg per day  Ok to take the allegra and nasacort, You had the steroid shot today  Please continue all other medications as before, and refills have been done if requested.  Please have the pharmacy call with any other refills you may need.  Please continue your efforts at being more active, low cholesterol diet, and weight control.  You are otherwise up to date with prevention measures today.  Please keep your appointments with your specialists as you may have planned  Please make an Appointment to return in 6 months, or sooner if needed, also with Lab Appointment for testing done 3-5 days before at the [facility] (so this is for TWO appointments - please see the scheduling desk as you leave)  Due to the ongoing Covid 19 pandemic, our lab now requires an appointment for any labs done at our office.  If you need labs done and do not have an appointment, please call our office ahead of time to schedule before presenting to the lab for your testing.

## 2021-10-15 NOTE — Progress Notes (Signed)
Patient ID: [name], male   DOB: February 23, 1961, 60 y.o.   MRN: [id number]        Chief Complaint: follow up urinary frequency, low vit d, allergies, htn       HPI:  [name] is a 60 y.o. male here with c/o increased urinary frequency after seeing Dr Ronnald Ramp and lozol added for htn control; Denies urinary symptoms such as dysuria, urgency, flank pain, hematuria or n/v, fever, chills, though he also notes has had some urinary frequency prior to that and wondering about oab; Does have several wks ongoing nasal allergy symptoms with clearish congestion, itch and sneezing, without fever, pain, ST, cough, swelling or wheezing.  Not taking vit d       Wt Readings from Last 3 Encounters:  10/15/21 193 lb (87.5 kg)  08/27/21 188 lb (85.3 kg)  07/13/21 192 lb (87.1 kg)   BP Readings from Last 3 Encounters:  10/15/21 (!) 150/82  08/27/21 (!) 170/98  07/13/21 140/86         Past Medical History:  Diagnosis Date   Allergy    Asthma    bronchitis   Colon polyps    Dysrhythmia    per pt many years "heart skips a beat and feels faster and they put me on meds that I've been taking ever since" 01/21/20   GERD (gastroesophageal reflux disease)    Hypertension    MRSA infection 09/07/2016   Pleurisy    hx-12/13   Pneumonia    "double pneumonia and pleurisy around 2014" 01/21/20   Rectal fissure    Seasonal allergies    Skin cancer    skin cancer melanoma "removed off my head many years ago, maybe 15 years ago" 01/21/20   Past Surgical History:  Procedure Laterality Date   ANTERIOR CERVICAL DECOMP/DISCECTOMY FUSION N/A 01/24/2020   Procedure: ANTERIOR CERVICAL DECOMPRESSION FUSION, CERVICAL 4-5, CERVICAL 5-6, CERVIXAL 6-7;  Surgeon: Phylliss Bob, MD;  Location: [place];  Service: Orthopedics;  Laterality: N/A;   APPENDECTOMY  2006   lap append   CHOLECYSTECTOMY     HEMORRHOID SURGERY  2005   HIP SURGERY Right 03/06/2018   I & D EXTREMITY Left 07/30/2016   Procedure: left knee  arthroscopic wash out;  Surgeon: Melrose Nakayama, MD;  Location: [place];  Service: Orthopedics;  Laterality: Left;   I & D EXTREMITY Left 08/06/2016   Procedure: IRRIGATION AND DEBRIDEMENT LEFT KNEE W/ SYNOVECTOMY;  Surgeon: Melrose Nakayama, MD;  Location: [place];  Service: Orthopedics;  Laterality: Left;   KNEE ARTHROSCOPY  2009   left   KNEE ARTHROSCOPY Left 06/12/2013   Procedure: LEFT KNEE ARTHROSCOPY PARTIAL MEDIAL MENISECTOMY WITH CHONDROPLASTY;  Surgeon: Hessie Dibble, MD;  Location: [place];  Service: Orthopedics;  Laterality: Left;   KNEE SURGERY  2005   lt acl   NASAL FRACTURE SURGERY  2012   closed reduction    SHOULDER SURGERY  2010   left    reports that he quit smoking about 36 years ago. His smoking use included cigarettes and cigars. He has a 1.00 pack-year smoking history. He has quit using smokeless tobacco.  His smokeless tobacco use included chew. He reports current alcohol use of about 8.0 standard drinks per week. He reports current drug use. Frequency: 2.00 times per week. Drugs: Marijuana and Other-see comments. family history includes Arthritis in his maternal grandfather, maternal grandmother, paternal grandfather, and paternal grandmother; Bipolar disorder in his maternal grandmother; Heart attack  in his father and mother; Hyperlipidemia in his brother, maternal grandfather, maternal grandmother, and mother; Hypertension in his maternal grandfather, maternal grandmother, mother, paternal grandfather, and paternal grandmother; Skin cancer in his mother; Stroke in his maternal grandfather and mother. Allergies  Allergen Reactions   Ace Inhibitors Cough   Codeine Itching, Nausea Only and Rash   Current Outpatient Medications on File Prior to Visit  Medication Sig Dispense Refill   albuterol (VENTOLIN HFA) 108 (90 Base) MCG/ACT inhaler Inhale 2 puffs into the lungs every 6 (six) hours as needed for wheezing or shortness of breath. 18 g 5   amLODipine  (NORVASC) 10 MG tablet Take 1 tablet (10 mg total) by mouth daily. 90 tablet 3   amphetamine-dextroamphetamine (ADDERALL) 20 MG tablet Take 1 tablet (20 mg total) by mouth 3 (three) times daily. 90 tablet 0   diazepam (VALIUM) 5 MG tablet Take 2 tablets (10 mg total) by mouth every 12 (twelve) hours as needed for anxiety. 2 tablet 0   fluticasone (FLONASE) 50 MCG/ACT nasal spray Place 2 sprays into both nostrils daily as needed for allergies.      glucosamine-chondroitin 500-400 MG tablet Take 1 tablet by mouth daily.      meloxicam (MOBIC) 15 MG tablet Take 15 mg by mouth daily.     Multiple Vitamin (MULTIVITAMIN WITH MINERALS) TABS tablet Take 1 tablet by mouth daily.     pantoprazole (PROTONIX) 40 MG tablet Take 1 tablet (40 mg total) by mouth daily. TAKE ONE TABLET BY MOUTH DAILY 60 tablet 3   sildenafil (VIAGRA) 100 MG tablet Take 0.5-1 tablets (50-100 mg total) by mouth daily as needed for erectile dysfunction. 10 tablet 11   tamsulosin (FLOMAX) 0.4 MG CAPS capsule Take 1 capsule (0.4 mg total) by mouth daily. 90 capsule 3   Turmeric 400 MG CAPS Take by mouth.     No current facility-administered medications on file prior to visit.        ROS:  All others reviewed and negative.  Objective        PE:  BP (!) 150/82 (BP Location: Left Arm, Patient Position: Sitting, Cuff Size: Large)   Pulse 84   Ht 5\' 9"  (1.753 m)   Wt 193 lb (87.5 kg)   SpO2 96%   BMI 28.50 kg/m                 Constitutional: Pt appears in NAD               HENT: Head: NCAT.                Right Ear: External ear normal.                 Left Ear: External ear normal.                Eyes: . Pupils are equal, round, and reactive to light. Conjunctivae and EOM are normal               Nose: without d/c or deformity               Neck: Neck supple. Gross normal ROM               Cardiovascular: Normal rate and regular rhythm.                 Pulmonary/Chest: Effort normal and breath sounds without rales or  wheezing.  Abd:  Soft, NT, ND, + BS, no organomegaly               Neurological: Pt is alert. At baseline orientation, motor grossly intact               Skin: Skin is warm. No rashes, no other new lesions, LE edema - none               Psychiatric: Pt behavior is normal without agitation   Micro: none  Cardiac tracings I have personally interpreted today:  none  Pertinent Radiological findings (summarize): none   Lab Results  Component Value Date   WBC 7.8 08/27/2021   HGB 15.0 08/27/2021   HCT 44.0 08/27/2021   PLT 300.0 08/27/2021   GLUCOSE 102 (H) 08/27/2021   CHOL 175 10/12/2021   TRIG 204.0 (H) 10/12/2021   HDL 53.50 10/12/2021   LDLDIRECT 101.0 10/12/2021   LDLCALC 86 08/29/2020   ALT 23 08/27/2021   AST 18 08/27/2021   NA 137 08/27/2021   K 4.4 08/27/2021   CL 103 08/27/2021   CREATININE 0.89 08/27/2021   BUN 15 08/27/2021   CO2 28 08/27/2021   TSH 1.73 08/27/2021   PSA 2.21 10/12/2021   INR 0.9 08/29/2020   HGBA1C 5.9 08/27/2021   Assessment/Plan:  [name] is a 60 y.o. White or Caucasian [1] male with  has a past medical history of Allergy, Asthma, Colon polyps, Dysrhythmia, GERD (gastroesophageal reflux disease), Hypertension, MRSA infection (09/07/2016), Pleurisy, Pneumonia, Rectal fissure, Seasonal allergies, and Skin cancer.  Allergic rhinitis Mild to mod, for depomedrol im 80, restart allegra and nasacort, to f/u any worsening symptoms or concerns  Essential hypertension BP Readings from Last 3 Encounters:  10/15/21 (!) 150/82  08/27/21 (!) 170/98  07/13/21 140/86   uncontrolled, to d/c lozol due to GU symptoms and increased micardis 80 qd, f/u bp at home and next visit   Hyperglycemia Lab Results  Component Value Date   HGBA1C 5.9 08/27/2021   Stable, pt to continue current medical treatment - diet   Urinary frequency Possibly recently mild acute on chronic, declines ua today, consider vesicare for possible oab if  persists  Vitamin D deficiency Last vitamin D Lab Results  Component Value Date   VD25OH 31.47 10/12/2021   Low, to start oral replacement  Followup: No follow-ups on file.  Cathlean Cower, MD 10/18/2021 3:33 PM [facility]

## 2021-10-18 DIAGNOSIS — E559 Vitamin D deficiency, unspecified: Secondary | ICD-10-CM | POA: Insufficient documentation

## 2021-10-18 DIAGNOSIS — R350 Frequency of micturition: Secondary | ICD-10-CM | POA: Insufficient documentation

## 2021-10-18 NOTE — Assessment & Plan Note (Signed)
Possibly recently mild acute on chronic, declines ua today, consider vesicare for possible oab if persists

## 2021-10-18 NOTE — Addendum Note (Signed)
Addended by: Biagio Borg on: 10/18/2021 03:37 PM   Modules accepted: Orders

## 2021-10-18 NOTE — Assessment & Plan Note (Signed)
Mild to mod, for depomedrol im 80, restart allegra and nasacort, to f/u any worsening symptoms or concerns

## 2021-10-18 NOTE — Assessment & Plan Note (Signed)
BP Readings from Last 3 Encounters:  10/15/21 (!) 150/82  08/27/21 (!) 170/98  07/13/21 140/86   uncontrolled, to d/c lozol due to GU symptoms and increased micardis 80 qd, f/u bp at home and next visit

## 2021-10-18 NOTE — Assessment & Plan Note (Signed)
Lab Results  Component Value Date   HGBA1C 5.9 08/27/2021   Stable, pt to continue current medical treatment - diet

## 2021-10-18 NOTE — Assessment & Plan Note (Signed)
Last vitamin D Lab Results  Component Value Date   VD25OH 31.47 10/12/2021   Low, to start oral replacement

## 2021-11-03 ENCOUNTER — Encounter: Payer: Self-pay | Admitting: Internal Medicine

## 2021-11-04 ENCOUNTER — Other Ambulatory Visit: Payer: Self-pay

## 2021-11-04 ENCOUNTER — Encounter: Payer: Self-pay | Admitting: Internal Medicine

## 2021-11-04 ENCOUNTER — Ambulatory Visit (INDEPENDENT_AMBULATORY_CARE_PROVIDER_SITE_OTHER): Payer: 59 | Admitting: Internal Medicine

## 2021-11-04 VITALS — BP 142/70 | HR 97 | Temp 98.4°F | Ht 69.0 in | Wt 191.0 lb

## 2021-11-04 DIAGNOSIS — I10 Essential (primary) hypertension: Secondary | ICD-10-CM | POA: Diagnosis not present

## 2021-11-04 DIAGNOSIS — R221 Localized swelling, mass and lump, neck: Secondary | ICD-10-CM

## 2021-11-04 DIAGNOSIS — R739 Hyperglycemia, unspecified: Secondary | ICD-10-CM

## 2021-11-04 DIAGNOSIS — R1310 Dysphagia, unspecified: Secondary | ICD-10-CM | POA: Insufficient documentation

## 2021-11-04 DIAGNOSIS — R1312 Dysphagia, oropharyngeal phase: Secondary | ICD-10-CM | POA: Diagnosis not present

## 2021-11-04 MED ORDER — AMPHETAMINE-DEXTROAMPHETAMINE 20 MG PO TABS: 20.0000 mg | ORAL_TABLET | Freq: Three times a day (TID) | ORAL | 0 refills | Status: DC

## 2021-11-04 NOTE — Progress Notes (Signed)
Patient ID: [name], male   DOB: 04/17/1961, 60 y.o.   MRN: [id number]        Chief Complaint: follow up dysphagia       HPI:  [name] is a 60 y.o. male here with c/o 1 mo onset post pharyngeal area dysphagia to solids, liquids and sometimes pill, new for him, and requests referral as a friend with similar ended up with EGD with dilation.  Feels like a lump in the back of the throat.  Has hx of cervical spine dz    Denies worsening reflux, abd pain,n/v, bowel change or blood.  Pt denies chest pain, increased sob or doe, wheezing, orthopnea, PND, increased LE swelling, palpitations, dizziness or syncope.   Pt denies polydipsia, polyuria, and no recent significant wt change.   Pt denies fever, wt loss, night sweats, loss of appetite, or other constitutional symptoms  no pain on swallowing        Wt Readings from Last 3 Encounters:  11/04/21 191 lb (86.6 kg)  10/15/21 193 lb (87.5 kg)  08/27/21 188 lb (85.3 kg)   BP Readings from Last 3 Encounters:  11/04/21 (!) 142/70  10/15/21 (!) 150/82  08/27/21 (!) 170/98         Past Medical History:  Diagnosis Date   Allergy    Asthma    bronchitis   Colon polyps    Dysrhythmia    per pt many years "heart skips a beat and feels faster and they put me on meds that I've been taking ever since" 01/21/20   GERD (gastroesophageal reflux disease)    Hypertension    MRSA infection 09/07/2016   Pleurisy    hx-12/13   Pneumonia    "double pneumonia and pleurisy around 2014" 01/21/20   Rectal fissure    Seasonal allergies    Skin cancer    skin cancer melanoma "removed off my head many years ago, maybe 15 years ago" 01/21/20   Past Surgical History:  Procedure Laterality Date   ANTERIOR CERVICAL DECOMP/DISCECTOMY FUSION N/A 01/24/2020   Procedure: ANTERIOR CERVICAL DECOMPRESSION FUSION, CERVICAL 4-5, CERVICAL 5-6, CERVIXAL 6-7;  Surgeon: Phylliss Bob, MD;  Location: [place];  Service: Orthopedics;  Laterality: N/A;   APPENDECTOMY  2006    lap append   CHOLECYSTECTOMY     HEMORRHOID SURGERY  2005   HIP SURGERY Right 03/06/2018   I & D EXTREMITY Left 07/30/2016   Procedure: left knee arthroscopic wash out;  Surgeon: Melrose Nakayama, MD;  Location: [place];  Service: Orthopedics;  Laterality: Left;   I & D EXTREMITY Left 08/06/2016   Procedure: IRRIGATION AND DEBRIDEMENT LEFT KNEE W/ SYNOVECTOMY;  Surgeon: Melrose Nakayama, MD;  Location: [place];  Service: Orthopedics;  Laterality: Left;   KNEE ARTHROSCOPY  2009   left   KNEE ARTHROSCOPY Left 06/12/2013   Procedure: LEFT KNEE ARTHROSCOPY PARTIAL MEDIAL MENISECTOMY WITH CHONDROPLASTY;  Surgeon: Hessie Dibble, MD;  Location: [place];  Service: Orthopedics;  Laterality: Left;   KNEE SURGERY  2005   lt acl   NASAL FRACTURE SURGERY  2012   closed reduction    SHOULDER SURGERY  2010   left    reports that he quit smoking about 36 years ago. His smoking use included cigarettes and cigars. He has a 1.00 pack-year smoking history. He has quit using smokeless tobacco.  His smokeless tobacco use included chew. He reports current alcohol use of about 8.0 standard drinks per week.  He reports current drug use. Frequency: 2.00 times per week. Drugs: Marijuana and Other-see comments. family history includes Arthritis in his maternal grandfather, maternal grandmother, paternal grandfather, and paternal grandmother; Bipolar disorder in his maternal grandmother; Heart attack in his father and mother; Hyperlipidemia in his brother, maternal grandfather, maternal grandmother, and mother; Hypertension in his maternal grandfather, maternal grandmother, mother, paternal grandfather, and paternal grandmother; Skin cancer in his mother; Stroke in his maternal grandfather and mother. Allergies  Allergen Reactions   Ace Inhibitors Cough   Codeine Itching, Nausea Only and Rash   Current Outpatient Medications on File Prior to Visit  Medication Sig Dispense Refill   albuterol (VENTOLIN HFA)  108 (90 Base) MCG/ACT inhaler Inhale 2 puffs into the lungs every 6 (six) hours as needed for wheezing or shortness of breath. 18 g 5   amLODipine (NORVASC) 10 MG tablet Take 1 tablet (10 mg total) by mouth daily. 90 tablet 3   amphetamine-dextroamphetamine (ADDERALL) 20 MG tablet Take 1 tablet (20 mg total) by mouth 3 (three) times daily. 90 tablet 0   diazepam (VALIUM) 5 MG tablet Take 2 tablets (10 mg total) by mouth every 12 (twelve) hours as needed for anxiety. 2 tablet 0   fexofenadine (ALLEGRA) 180 MG tablet Take 1 tablet (180 mg total) by mouth daily. 90 tablet 3   fluticasone (FLONASE) 50 MCG/ACT nasal spray Place 2 sprays into both nostrils daily as needed for allergies.      glucosamine-chondroitin 500-400 MG tablet Take 1 tablet by mouth daily.      meloxicam (MOBIC) 15 MG tablet Take 15 mg by mouth daily.     Multiple Vitamin (MULTIVITAMIN WITH MINERALS) TABS tablet Take 1 tablet by mouth daily.     pantoprazole (PROTONIX) 40 MG tablet Take 1 tablet (40 mg total) by mouth daily. TAKE ONE TABLET BY MOUTH DAILY 60 tablet 3   sildenafil (VIAGRA) 100 MG tablet Take 0.5-1 tablets (50-100 mg total) by mouth daily as needed for erectile dysfunction. 10 tablet 11   solifenacin (VESICARE) 5 MG tablet Take 1 tablet (5 mg total) by mouth daily. 90 tablet 3   tamsulosin (FLOMAX) 0.4 MG CAPS capsule Take 1 capsule (0.4 mg total) by mouth daily. 90 capsule 3   telmisartan (MICARDIS) 80 MG tablet Take 1 tablet (80 mg total) by mouth daily. 90 tablet 3   triamcinolone (NASACORT) 55 MCG/ACT AERO nasal inhaler Place 2 sprays into the nose daily. 1 each 12   Turmeric 400 MG CAPS Take by mouth.     No current facility-administered medications on file prior to visit.        ROS:  All others reviewed and negative.  Objective        PE:  BP (!) 142/70   Pulse 97   Temp 98.4 F (36.9 C) (Oral)   Ht 5\' 9"  (1.753 m)   Wt 191 lb (86.6 kg)   SpO2 98%   BMI 28.21 kg/m                 Constitutional:  Pt appears in NAD               HENT: Head: NCAT.                Right Ear: External ear normal.                 Left Ear: External ear normal.  Eyes: . Pupils are equal, round, and reactive to light. Conjunctivae and EOM are normal; oropharynx without swelling or mass or bleeding               Nose: without d/c or deformity               Neck: Neck supple. Gross normal ROM               Cardiovascular: Normal rate and regular rhythm.                 Pulmonary/Chest: Effort normal and breath sounds without rales or wheezing.                Abd:  Soft, NT, ND, + BS, no organomegaly               Neurological: Pt is alert. At baseline orientation, motor grossly intact               Skin: Skin is warm. No rashes, no other new lesions, LE edema - none               Psychiatric: Pt behavior is normal without agitation   Micro: none  Cardiac tracings I have personally interpreted today:  none  Pertinent Radiological findings (summarize): none   Lab Results  Component Value Date   WBC 7.8 08/27/2021   HGB 15.0 08/27/2021   HCT 44.0 08/27/2021   PLT 300.0 08/27/2021   GLUCOSE 102 (H) 08/27/2021   CHOL 175 10/12/2021   TRIG 204.0 (H) 10/12/2021   HDL 53.50 10/12/2021   LDLDIRECT 101.0 10/12/2021   LDLCALC 86 08/29/2020   ALT 23 08/27/2021   AST 18 08/27/2021   NA 137 08/27/2021   K 4.4 08/27/2021   CL 103 08/27/2021   CREATININE 0.89 08/27/2021   BUN 15 08/27/2021   CO2 28 08/27/2021   TSH 1.73 08/27/2021   PSA 2.21 10/12/2021   INR 0.9 08/29/2020   HGBA1C 5.9 08/27/2021   Assessment/Plan:  [name] is a 60 y.o. White or Caucasian [1] male with  has a past medical history of Allergy, Asthma, Colon polyps, Dysrhythmia, GERD (gastroesophageal reflux disease), Hypertension, MRSA infection (09/07/2016), Pleurisy, Pneumonia, Rectal fissure, Seasonal allergies, and Skin cancer.  Essential hypertension BP Readings from Last 3 Encounters:  11/04/21 (!) 142/70   10/15/21 (!) 150/82  08/27/21 (!) 170/98   Mild uncontrolled, pt to continue medical treatment amlodipine, micardis as declines change   Hyperglycemia Lab Results  Component Value Date   HGBA1C 5.9 08/27/2021   Stable, pt to continue current medical treatment  - diet   Dysphagia Exam benign, new onset, etiology unclear, for GI referral,  to f/u any worsening symptoms or concerns   Sensation of lump in throat Also for ENT referral per pt reqeust,  to f/u any worsening symptoms or concerns  Followup: Return if symptoms worsen or fail to improve.  Cathlean Cower, MD 11/08/2021 7:14 PM [facility]

## 2021-11-08 ENCOUNTER — Encounter: Payer: Self-pay | Admitting: Internal Medicine

## 2021-11-08 NOTE — Assessment & Plan Note (Signed)
Exam benign, new onset, etiology unclear, for GI referral,  to f/u any worsening symptoms or concerns

## 2021-11-08 NOTE — Patient Instructions (Signed)
Please continue all other medications as before, and refills have been done if requested.  Please have the pharmacy call with any other refills you may need.  Please continue your efforts at being more active, low cholesterol diet, and weight control.  Please keep your appointments with your specialists as you may have planned  You will be contacted regarding the referral for: ent and GI

## 2021-11-08 NOTE — Assessment & Plan Note (Signed)
Lab Results  Component Value Date   HGBA1C 5.9 08/27/2021   Stable, pt to continue current medical treatment  - diet

## 2021-11-08 NOTE — Assessment & Plan Note (Signed)
BP Readings from Last 3 Encounters:  11/04/21 (!) 142/70  10/15/21 (!) 150/82  08/27/21 (!) 170/98   Mild uncontrolled, pt to continue medical treatment amlodipine, micardis as declines change

## 2021-11-08 NOTE — Assessment & Plan Note (Signed)
Also for ENT referral per pt reqeust,  to f/u any worsening symptoms or concerns

## 2021-11-13 ENCOUNTER — Other Ambulatory Visit: Payer: Self-pay | Admitting: Internal Medicine

## 2021-11-13 NOTE — Telephone Encounter (Signed)
Please refill as per office routine med refill policy (all routine meds to be refilled for 3 mo or monthly (per pt preference) up to one year from last visit, then month to month grace period for 3 mo, then further med refills will have to be denied) ? ?

## 2021-11-17 ENCOUNTER — Ambulatory Visit: Admit: 2021-11-17 | Payer: 59 | Admitting: Orthopedic Surgery

## 2021-11-17 SURGERY — CARPOMETACARPEL (CMC) SUSPENSION PLASTY
Anesthesia: Regional | Laterality: Right

## 2021-12-02 ENCOUNTER — Ambulatory Visit: Payer: 59 | Admitting: Internal Medicine

## 2021-12-06 ENCOUNTER — Encounter: Payer: Self-pay | Admitting: Internal Medicine

## 2021-12-07 MED ORDER — AMPHETAMINE-DEXTROAMPHETAMINE 20 MG PO TABS: 20.0000 mg | ORAL_TABLET | Freq: Three times a day (TID) | ORAL | 0 refills | Status: DC

## 2021-12-09 ENCOUNTER — Ambulatory Visit: Payer: 59 | Admitting: Internal Medicine

## 2022-01-03 ENCOUNTER — Encounter: Payer: Self-pay | Admitting: Internal Medicine

## 2022-01-04 MED ORDER — AMPHETAMINE-DEXTROAMPHETAMINE 20 MG PO TABS: 20.0000 mg | ORAL_TABLET | Freq: Three times a day (TID) | ORAL | 0 refills | Status: DC

## 2022-01-29 ENCOUNTER — Other Ambulatory Visit: Payer: Self-pay | Admitting: Internal Medicine

## 2022-01-29 DIAGNOSIS — I10 Essential (primary) hypertension: Secondary | ICD-10-CM

## 2022-01-29 NOTE — Telephone Encounter (Signed)
Please refill as per office routine med refill policy (all routine meds to be refilled for 3 mo or monthly (per pt preference) up to one year from last visit, then month to month grace period for 3 mo, then further med refills will have to be denied) ? ?

## 2022-02-01 ENCOUNTER — Encounter: Payer: Self-pay | Admitting: Internal Medicine

## 2022-02-02 MED ORDER — AMPHETAMINE-DEXTROAMPHETAMINE 20 MG PO TABS: 20.0000 mg | ORAL_TABLET | Freq: Three times a day (TID) | ORAL | 0 refills | Status: DC

## 2022-03-04 ENCOUNTER — Encounter: Payer: Self-pay | Admitting: Internal Medicine

## 2022-03-05 MED ORDER — AMPHETAMINE-DEXTROAMPHETAMINE 20 MG PO TABS: 20.0000 mg | ORAL_TABLET | Freq: Three times a day (TID) | ORAL | 0 refills | Status: DC

## 2022-03-23 ENCOUNTER — Telehealth: Payer: Self-pay | Admitting: Emergency Medicine

## 2022-03-23 ENCOUNTER — Ambulatory Visit: Payer: Self-pay | Admitting: Emergency Medicine

## 2022-03-23 DIAGNOSIS — J4520 Mild intermittent asthma, uncomplicated: Secondary | ICD-10-CM

## 2022-03-23 NOTE — Telephone Encounter (Signed)
Spoke to patient and relayed below message.  ?He voiced his understanding.  ?Referral has been placed. ?Nothing further needed.  ? ?

## 2022-03-23 NOTE — Telephone Encounter (Signed)
Patient has out of [employer] and is having issues with his lungs. Patient would like Dr. Agustina Caroli recommendation for a pulmonologist with [facility] until he can change his insurance. Patient was informed of the self pay amount and did not want to do that at this time.  ? ?Please advise. Call back number is [phone].  ?

## 2022-03-23 NOTE — Telephone Encounter (Signed)
I think he should try to get in with first available provider that can see him as a new consult. I don't have a favorite. We can make the referral if he needs [place] to.  ?

## 2022-03-26 ENCOUNTER — Other Ambulatory Visit: Payer: Self-pay | Admitting: Internal Medicine

## 2022-03-30 ENCOUNTER — Encounter: Payer: Self-pay | Admitting: Internal Medicine

## 2022-03-31 MED ORDER — PANTOPRAZOLE SODIUM 40 MG PO TBEC: 40.0000 mg | DELAYED_RELEASE_TABLET | Freq: Every day | ORAL | 2 refills | Status: AC

## 2022-03-31 MED ORDER — AMPHETAMINE-DEXTROAMPHETAMINE 20 MG PO TABS: 20.0000 mg | ORAL_TABLET | Freq: Three times a day (TID) | ORAL | 0 refills | Status: AC

## 2022-04-09 ENCOUNTER — Other Ambulatory Visit: Payer: 59

## 2022-04-15 ENCOUNTER — Ambulatory Visit: Payer: 59 | Admitting: Internal Medicine

## 2022-04-26 ENCOUNTER — Ambulatory Visit: Payer: 59 | Admitting: Internal Medicine

## 2022-05-01 ENCOUNTER — Other Ambulatory Visit: Payer: Self-pay | Admitting: Internal Medicine

## 2022-05-01 DIAGNOSIS — I10 Essential (primary) hypertension: Secondary | ICD-10-CM

## 2022-05-01 NOTE — Telephone Encounter (Signed)
Please refill as per office routine med refill policy (all routine meds to be refilled for 3 mo or monthly (per pt preference) up to one year from last visit, then month to month grace period for 3 mo, then further med refills will have to be denied) ? ?

## 2022-06-26 ENCOUNTER — Other Ambulatory Visit: Payer: Self-pay | Admitting: Internal Medicine

## 2022-10-07 ENCOUNTER — Other Ambulatory Visit: Payer: Self-pay | Admitting: Internal Medicine

## 2022-10-07 NOTE — Telephone Encounter (Signed)
Please refill as per office routine med refill policy (all routine meds to be refilled for 3 mo or monthly (per pt preference) up to one year from last visit, then month to month grace period for 3 mo, then further med refills will have to be denied) ? ?

## 2022-11-16 ENCOUNTER — Other Ambulatory Visit: Payer: Self-pay | Admitting: Internal Medicine

## 2022-11-16 NOTE — Telephone Encounter (Signed)
Please refill as per office routine med refill policy (all routine meds to be refilled for 3 mo or monthly (per pt preference) up to one year from last visit, then month to month grace period for 3 mo, then further med refills will have to be denied) ? ?

## 2022-12-16 ENCOUNTER — Other Ambulatory Visit: Payer: Self-pay | Admitting: Internal Medicine

## 2022-12-16 NOTE — Telephone Encounter (Signed)
Please refill as per office routine med refill policy (all routine meds to be refilled for 3 mo or monthly (per pt preference) up to one year from last visit, then month to month grace period for 3 mo, then further med refills will have to be denied) ? ?

## 2022-12-24 ENCOUNTER — Other Ambulatory Visit: Payer: Self-pay | Admitting: Internal Medicine

## 2022-12-24 NOTE — Telephone Encounter (Signed)
Please refill as per office routine med refill policy (all routine meds to be refilled for 3 mo or monthly (per pt preference) up to one year from last visit, then month to month grace period for 3 mo, then further med refills will have to be denied) ? ?
